# Patient Record
Sex: Female | Born: 1955
Health system: Southern US, Community
[De-identification: ages and names within clinical notes are randomized; demographics above are authoritative.]

## PROBLEM LIST (undated history)

## (undated) DIAGNOSIS — F339 Major depressive disorder, recurrent, unspecified: Secondary | ICD-10-CM

## (undated) DIAGNOSIS — I10 Essential (primary) hypertension: Secondary | ICD-10-CM

## (undated) DIAGNOSIS — G44229 Chronic tension-type headache, not intractable: Secondary | ICD-10-CM

## (undated) DIAGNOSIS — E538 Deficiency of other specified B group vitamins: Secondary | ICD-10-CM

## (undated) DIAGNOSIS — I7300 Raynaud's syndrome without gangrene: Secondary | ICD-10-CM

## (undated) DIAGNOSIS — E119 Type 2 diabetes mellitus without complications: Secondary | ICD-10-CM

## (undated) DIAGNOSIS — G43909 Migraine, unspecified, not intractable, without status migrainosus: Secondary | ICD-10-CM

## (undated) DIAGNOSIS — M797 Fibromyalgia: Secondary | ICD-10-CM

## (undated) DIAGNOSIS — J45909 Unspecified asthma, uncomplicated: Secondary | ICD-10-CM

## (undated) DIAGNOSIS — E079 Disorder of thyroid, unspecified: Secondary | ICD-10-CM

## (undated) DIAGNOSIS — E039 Hypothyroidism, unspecified: Secondary | ICD-10-CM

## (undated) DIAGNOSIS — E1142 Type 2 diabetes mellitus with diabetic polyneuropathy: Secondary | ICD-10-CM

## (undated) DIAGNOSIS — G629 Polyneuropathy, unspecified: Secondary | ICD-10-CM

## (undated) DIAGNOSIS — K219 Gastro-esophageal reflux disease without esophagitis: Secondary | ICD-10-CM

## (undated) DIAGNOSIS — G4733 Obstructive sleep apnea (adult) (pediatric): Secondary | ICD-10-CM

## (undated) DIAGNOSIS — M1990 Unspecified osteoarthritis, unspecified site: Secondary | ICD-10-CM

## (undated) DIAGNOSIS — F32A Depression, unspecified: Secondary | ICD-10-CM

## (undated) DIAGNOSIS — F411 Generalized anxiety disorder: Secondary | ICD-10-CM

## (undated) DIAGNOSIS — E559 Vitamin D deficiency, unspecified: Secondary | ICD-10-CM

## (undated) DIAGNOSIS — F329 Major depressive disorder, single episode, unspecified: Secondary | ICD-10-CM

## (undated) DIAGNOSIS — G4700 Insomnia, unspecified: Secondary | ICD-10-CM

## (undated) DIAGNOSIS — G4730 Sleep apnea, unspecified: Secondary | ICD-10-CM

## (undated) DIAGNOSIS — G5601 Carpal tunnel syndrome, right upper limb: Secondary | ICD-10-CM

## (undated) DIAGNOSIS — I2510 Atherosclerotic heart disease of native coronary artery without angina pectoris: Secondary | ICD-10-CM

## (undated) DIAGNOSIS — D869 Sarcoidosis, unspecified: Secondary | ICD-10-CM

## (undated) DIAGNOSIS — R404 Transient alteration of awareness: Secondary | ICD-10-CM

## (undated) DIAGNOSIS — M8580 Other specified disorders of bone density and structure, unspecified site: Secondary | ICD-10-CM

## (undated) DIAGNOSIS — E785 Hyperlipidemia, unspecified: Secondary | ICD-10-CM

## (undated) DIAGNOSIS — M549 Dorsalgia, unspecified: Secondary | ICD-10-CM

## (undated) DIAGNOSIS — E7800 Pure hypercholesterolemia, unspecified: Secondary | ICD-10-CM

## (undated) DIAGNOSIS — Z9989 Dependence on other enabling machines and devices: Secondary | ICD-10-CM

## (undated) HISTORY — DX: Hypothyroidism, unspecified: E03.9

## (undated) HISTORY — DX: Insomnia, unspecified: G47.00

## (undated) HISTORY — DX: Unspecified osteoarthritis, unspecified site: M19.90

## (undated) HISTORY — DX: Migraine, unspecified, not intractable, without status migrainosus: G43.909

## (undated) HISTORY — DX: Raynaud's syndrome without gangrene: I73.00

## (undated) HISTORY — DX: Vitamin D deficiency, unspecified: E55.9

## (undated) HISTORY — DX: Fibromyalgia: M79.7

## (undated) HISTORY — DX: Deficiency of other specified B group vitamins: E53.8

## (undated) HISTORY — DX: Dorsalgia, unspecified: M54.9

## (undated) HISTORY — DX: Major depressive disorder, recurrent, unspecified: F33.9

## (undated) HISTORY — DX: Depression, unspecified: F32.A

## (undated) HISTORY — DX: Obstructive sleep apnea (adult) (pediatric): G47.33

## (undated) HISTORY — DX: Carpal tunnel syndrome, right upper limb: G56.01

## (undated) HISTORY — DX: Unspecified asthma, uncomplicated: J45.909

## (undated) HISTORY — DX: Dependence on other enabling machines and devices: Z99.89

## (undated) HISTORY — PX: COLONOSCOPY: SHX174

## (undated) HISTORY — DX: Sleep apnea, unspecified: G47.30

## (undated) HISTORY — DX: Generalized anxiety disorder: F41.1

## (undated) HISTORY — DX: Major depressive disorder, single episode, unspecified: F32.9

## (undated) HISTORY — PX: TUBAL LIGATION: SHX77

## (undated) HISTORY — DX: Polyneuropathy, unspecified: G62.9

## (undated) HISTORY — DX: Other specified disorders of bone density and structure, unspecified site: M85.80

---
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
## 1976-01-06 HISTORY — PX: TONSILLECTOMY: SUR1361

## 1981-01-05 HISTORY — PX: NASAL POLYP SURGERY: SHX186

## 1998-08-12 ENCOUNTER — Other Ambulatory Visit: Admission: RE | Admit: 1998-08-12 | Discharge: 1998-08-12 | Payer: Self-pay | Admitting: Obstetrics & Gynecology

## 1999-10-23 ENCOUNTER — Other Ambulatory Visit: Admission: RE | Admit: 1999-10-23 | Discharge: 1999-10-23 | Payer: Self-pay | Admitting: Obstetrics & Gynecology

## 2001-07-05 ENCOUNTER — Other Ambulatory Visit: Admission: RE | Admit: 2001-07-05 | Discharge: 2001-07-05 | Payer: Self-pay | Admitting: Obstetrics & Gynecology

## 2003-07-05 ENCOUNTER — Other Ambulatory Visit: Admission: RE | Admit: 2003-07-05 | Discharge: 2003-07-05 | Payer: Self-pay | Admitting: Obstetrics & Gynecology

## 2004-10-16 ENCOUNTER — Ambulatory Visit: Payer: Self-pay | Admitting: Internal Medicine

## 2004-12-03 ENCOUNTER — Ambulatory Visit: Payer: Self-pay | Admitting: Internal Medicine

## 2008-01-20 DIAGNOSIS — J45909 Unspecified asthma, uncomplicated: Secondary | ICD-10-CM | POA: Insufficient documentation

## 2012-01-06 HISTORY — PX: CARPAL TUNNEL RELEASE: SHX101

## 2012-01-15 DIAGNOSIS — G5601 Carpal tunnel syndrome, right upper limb: Secondary | ICD-10-CM

## 2012-01-15 DIAGNOSIS — G5600 Carpal tunnel syndrome, unspecified upper limb: Secondary | ICD-10-CM | POA: Insufficient documentation

## 2012-01-15 HISTORY — DX: Carpal tunnel syndrome, right upper limb: G56.01

## 2012-02-08 ENCOUNTER — Other Ambulatory Visit: Payer: Self-pay | Admitting: Family Medicine

## 2012-03-15 ENCOUNTER — Other Ambulatory Visit: Payer: Self-pay | Admitting: Family Medicine

## 2012-03-28 ENCOUNTER — Other Ambulatory Visit: Payer: Self-pay | Admitting: Family Medicine

## 2012-11-24 DIAGNOSIS — M542 Cervicalgia: Secondary | ICD-10-CM | POA: Insufficient documentation

## 2012-11-24 DIAGNOSIS — M47812 Spondylosis without myelopathy or radiculopathy, cervical region: Secondary | ICD-10-CM | POA: Insufficient documentation

## 2013-01-27 DIAGNOSIS — M5410 Radiculopathy, site unspecified: Secondary | ICD-10-CM | POA: Insufficient documentation

## 2013-01-27 DIAGNOSIS — M9979 Connective tissue and disc stenosis of intervertebral foramina of abdomen and other regions: Secondary | ICD-10-CM | POA: Insufficient documentation

## 2013-01-27 DIAGNOSIS — M7918 Myalgia, other site: Secondary | ICD-10-CM | POA: Insufficient documentation

## 2013-02-10 DIAGNOSIS — D862 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: Secondary | ICD-10-CM | POA: Insufficient documentation

## 2013-02-10 HISTORY — DX: 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: D86.2

## 2013-06-02 DIAGNOSIS — E119 Type 2 diabetes mellitus without complications: Secondary | ICD-10-CM | POA: Insufficient documentation

## 2013-06-02 DIAGNOSIS — E039 Hypothyroidism, unspecified: Secondary | ICD-10-CM | POA: Insufficient documentation

## 2013-06-02 HISTORY — DX: Hypothyroidism, unspecified: E03.9

## 2013-06-27 DIAGNOSIS — R202 Paresthesia of skin: Secondary | ICD-10-CM | POA: Insufficient documentation

## 2014-01-09 DIAGNOSIS — J309 Allergic rhinitis, unspecified: Secondary | ICD-10-CM | POA: Diagnosis not present

## 2014-01-16 DIAGNOSIS — M6281 Muscle weakness (generalized): Secondary | ICD-10-CM | POA: Diagnosis not present

## 2014-01-16 DIAGNOSIS — Z9181 History of falling: Secondary | ICD-10-CM | POA: Diagnosis not present

## 2014-01-16 DIAGNOSIS — R2681 Unsteadiness on feet: Secondary | ICD-10-CM | POA: Diagnosis not present

## 2014-01-16 DIAGNOSIS — R262 Difficulty in walking, not elsewhere classified: Secondary | ICD-10-CM | POA: Diagnosis not present

## 2014-01-16 DIAGNOSIS — J309 Allergic rhinitis, unspecified: Secondary | ICD-10-CM | POA: Diagnosis not present

## 2014-01-23 DIAGNOSIS — Z8249 Family history of ischemic heart disease and other diseases of the circulatory system: Secondary | ICD-10-CM | POA: Diagnosis not present

## 2014-01-23 DIAGNOSIS — J309 Allergic rhinitis, unspecified: Secondary | ICD-10-CM | POA: Diagnosis not present

## 2014-01-23 DIAGNOSIS — E119 Type 2 diabetes mellitus without complications: Secondary | ICD-10-CM | POA: Diagnosis not present

## 2014-01-24 DIAGNOSIS — M6281 Muscle weakness (generalized): Secondary | ICD-10-CM | POA: Diagnosis not present

## 2014-01-24 DIAGNOSIS — R0683 Snoring: Secondary | ICD-10-CM | POA: Diagnosis not present

## 2014-01-24 DIAGNOSIS — R262 Difficulty in walking, not elsewhere classified: Secondary | ICD-10-CM | POA: Diagnosis not present

## 2014-01-24 DIAGNOSIS — R2681 Unsteadiness on feet: Secondary | ICD-10-CM | POA: Diagnosis not present

## 2014-01-24 DIAGNOSIS — G4733 Obstructive sleep apnea (adult) (pediatric): Secondary | ICD-10-CM | POA: Diagnosis not present

## 2014-01-24 DIAGNOSIS — Z9181 History of falling: Secondary | ICD-10-CM | POA: Diagnosis not present

## 2014-01-25 DIAGNOSIS — R262 Difficulty in walking, not elsewhere classified: Secondary | ICD-10-CM | POA: Diagnosis not present

## 2014-01-25 DIAGNOSIS — M6281 Muscle weakness (generalized): Secondary | ICD-10-CM | POA: Diagnosis not present

## 2014-01-25 DIAGNOSIS — R2681 Unsteadiness on feet: Secondary | ICD-10-CM | POA: Diagnosis not present

## 2014-01-25 DIAGNOSIS — Z9181 History of falling: Secondary | ICD-10-CM | POA: Diagnosis not present

## 2014-01-31 DIAGNOSIS — M6281 Muscle weakness (generalized): Secondary | ICD-10-CM | POA: Diagnosis not present

## 2014-01-31 DIAGNOSIS — R262 Difficulty in walking, not elsewhere classified: Secondary | ICD-10-CM | POA: Diagnosis not present

## 2014-01-31 DIAGNOSIS — Z9181 History of falling: Secondary | ICD-10-CM | POA: Diagnosis not present

## 2014-01-31 DIAGNOSIS — R2681 Unsteadiness on feet: Secondary | ICD-10-CM | POA: Diagnosis not present

## 2014-01-31 DIAGNOSIS — J309 Allergic rhinitis, unspecified: Secondary | ICD-10-CM | POA: Diagnosis not present

## 2014-02-01 DIAGNOSIS — M6281 Muscle weakness (generalized): Secondary | ICD-10-CM | POA: Diagnosis not present

## 2014-02-01 DIAGNOSIS — R2681 Unsteadiness on feet: Secondary | ICD-10-CM | POA: Diagnosis not present

## 2014-02-01 DIAGNOSIS — R262 Difficulty in walking, not elsewhere classified: Secondary | ICD-10-CM | POA: Diagnosis not present

## 2014-02-01 DIAGNOSIS — Z9181 History of falling: Secondary | ICD-10-CM | POA: Diagnosis not present

## 2014-02-02 DIAGNOSIS — Z9181 History of falling: Secondary | ICD-10-CM | POA: Diagnosis not present

## 2014-02-02 DIAGNOSIS — R262 Difficulty in walking, not elsewhere classified: Secondary | ICD-10-CM | POA: Diagnosis not present

## 2014-02-02 DIAGNOSIS — R2681 Unsteadiness on feet: Secondary | ICD-10-CM | POA: Diagnosis not present

## 2014-02-02 DIAGNOSIS — M6281 Muscle weakness (generalized): Secondary | ICD-10-CM | POA: Diagnosis not present

## 2014-02-05 DIAGNOSIS — R262 Difficulty in walking, not elsewhere classified: Secondary | ICD-10-CM | POA: Diagnosis not present

## 2014-02-05 DIAGNOSIS — Z9181 History of falling: Secondary | ICD-10-CM | POA: Diagnosis not present

## 2014-02-05 DIAGNOSIS — R2681 Unsteadiness on feet: Secondary | ICD-10-CM | POA: Diagnosis not present

## 2014-02-05 DIAGNOSIS — M6281 Muscle weakness (generalized): Secondary | ICD-10-CM | POA: Diagnosis not present

## 2014-02-06 DIAGNOSIS — J309 Allergic rhinitis, unspecified: Secondary | ICD-10-CM | POA: Diagnosis not present

## 2014-02-06 DIAGNOSIS — R2681 Unsteadiness on feet: Secondary | ICD-10-CM | POA: Diagnosis not present

## 2014-02-06 DIAGNOSIS — R262 Difficulty in walking, not elsewhere classified: Secondary | ICD-10-CM | POA: Diagnosis not present

## 2014-02-06 DIAGNOSIS — Z9181 History of falling: Secondary | ICD-10-CM | POA: Diagnosis not present

## 2014-02-06 DIAGNOSIS — M6281 Muscle weakness (generalized): Secondary | ICD-10-CM | POA: Diagnosis not present

## 2014-02-08 DIAGNOSIS — M6281 Muscle weakness (generalized): Secondary | ICD-10-CM | POA: Diagnosis not present

## 2014-02-08 DIAGNOSIS — R2681 Unsteadiness on feet: Secondary | ICD-10-CM | POA: Diagnosis not present

## 2014-02-08 DIAGNOSIS — R262 Difficulty in walking, not elsewhere classified: Secondary | ICD-10-CM | POA: Diagnosis not present

## 2014-02-08 DIAGNOSIS — Z9181 History of falling: Secondary | ICD-10-CM | POA: Diagnosis not present

## 2014-02-09 DIAGNOSIS — G629 Polyneuropathy, unspecified: Secondary | ICD-10-CM | POA: Diagnosis not present

## 2014-02-12 DIAGNOSIS — Z9181 History of falling: Secondary | ICD-10-CM | POA: Diagnosis not present

## 2014-02-12 DIAGNOSIS — M6281 Muscle weakness (generalized): Secondary | ICD-10-CM | POA: Diagnosis not present

## 2014-02-12 DIAGNOSIS — R2681 Unsteadiness on feet: Secondary | ICD-10-CM | POA: Diagnosis not present

## 2014-02-12 DIAGNOSIS — R262 Difficulty in walking, not elsewhere classified: Secondary | ICD-10-CM | POA: Diagnosis not present

## 2014-02-13 DIAGNOSIS — J309 Allergic rhinitis, unspecified: Secondary | ICD-10-CM | POA: Diagnosis not present

## 2014-02-13 DIAGNOSIS — E1140 Type 2 diabetes mellitus with diabetic neuropathy, unspecified: Secondary | ICD-10-CM | POA: Diagnosis not present

## 2014-02-13 DIAGNOSIS — E1069 Type 1 diabetes mellitus with other specified complication: Secondary | ICD-10-CM | POA: Diagnosis not present

## 2014-02-13 DIAGNOSIS — G4730 Sleep apnea, unspecified: Secondary | ICD-10-CM | POA: Diagnosis not present

## 2014-02-13 DIAGNOSIS — G43009 Migraine without aura, not intractable, without status migrainosus: Secondary | ICD-10-CM | POA: Diagnosis not present

## 2014-02-14 DIAGNOSIS — H9313 Tinnitus, bilateral: Secondary | ICD-10-CM | POA: Diagnosis not present

## 2014-02-14 DIAGNOSIS — Z9181 History of falling: Secondary | ICD-10-CM | POA: Diagnosis not present

## 2014-02-14 DIAGNOSIS — H903 Sensorineural hearing loss, bilateral: Secondary | ICD-10-CM | POA: Diagnosis not present

## 2014-02-14 DIAGNOSIS — R262 Difficulty in walking, not elsewhere classified: Secondary | ICD-10-CM | POA: Diagnosis not present

## 2014-02-14 DIAGNOSIS — M6281 Muscle weakness (generalized): Secondary | ICD-10-CM | POA: Diagnosis not present

## 2014-02-14 DIAGNOSIS — R2681 Unsteadiness on feet: Secondary | ICD-10-CM | POA: Diagnosis not present

## 2014-02-20 DIAGNOSIS — R262 Difficulty in walking, not elsewhere classified: Secondary | ICD-10-CM | POA: Diagnosis not present

## 2014-02-20 DIAGNOSIS — R2681 Unsteadiness on feet: Secondary | ICD-10-CM | POA: Diagnosis not present

## 2014-02-20 DIAGNOSIS — Z9181 History of falling: Secondary | ICD-10-CM | POA: Diagnosis not present

## 2014-02-20 DIAGNOSIS — M6281 Muscle weakness (generalized): Secondary | ICD-10-CM | POA: Diagnosis not present

## 2014-02-20 DIAGNOSIS — J309 Allergic rhinitis, unspecified: Secondary | ICD-10-CM | POA: Diagnosis not present

## 2014-02-22 DIAGNOSIS — M6281 Muscle weakness (generalized): Secondary | ICD-10-CM | POA: Diagnosis not present

## 2014-02-22 DIAGNOSIS — R262 Difficulty in walking, not elsewhere classified: Secondary | ICD-10-CM | POA: Diagnosis not present

## 2014-02-22 DIAGNOSIS — R2681 Unsteadiness on feet: Secondary | ICD-10-CM | POA: Diagnosis not present

## 2014-02-22 DIAGNOSIS — Z9181 History of falling: Secondary | ICD-10-CM | POA: Diagnosis not present

## 2014-02-24 DIAGNOSIS — G4733 Obstructive sleep apnea (adult) (pediatric): Secondary | ICD-10-CM | POA: Diagnosis not present

## 2014-02-27 DIAGNOSIS — R262 Difficulty in walking, not elsewhere classified: Secondary | ICD-10-CM | POA: Diagnosis not present

## 2014-02-27 DIAGNOSIS — M6281 Muscle weakness (generalized): Secondary | ICD-10-CM | POA: Diagnosis not present

## 2014-02-27 DIAGNOSIS — Z9181 History of falling: Secondary | ICD-10-CM | POA: Diagnosis not present

## 2014-02-27 DIAGNOSIS — J309 Allergic rhinitis, unspecified: Secondary | ICD-10-CM | POA: Diagnosis not present

## 2014-02-27 DIAGNOSIS — R2681 Unsteadiness on feet: Secondary | ICD-10-CM | POA: Diagnosis not present

## 2014-02-28 DIAGNOSIS — D509 Iron deficiency anemia, unspecified: Secondary | ICD-10-CM | POA: Diagnosis not present

## 2014-02-28 DIAGNOSIS — I129 Hypertensive chronic kidney disease with stage 1 through stage 4 chronic kidney disease, or unspecified chronic kidney disease: Secondary | ICD-10-CM | POA: Diagnosis not present

## 2014-02-28 DIAGNOSIS — N182 Chronic kidney disease, stage 2 (mild): Secondary | ICD-10-CM | POA: Diagnosis not present

## 2014-02-28 DIAGNOSIS — E1140 Type 2 diabetes mellitus with diabetic neuropathy, unspecified: Secondary | ICD-10-CM | POA: Diagnosis not present

## 2014-02-28 DIAGNOSIS — E1322 Other specified diabetes mellitus with diabetic chronic kidney disease: Secondary | ICD-10-CM | POA: Diagnosis not present

## 2014-03-01 DIAGNOSIS — R262 Difficulty in walking, not elsewhere classified: Secondary | ICD-10-CM | POA: Diagnosis not present

## 2014-03-01 DIAGNOSIS — M6281 Muscle weakness (generalized): Secondary | ICD-10-CM | POA: Diagnosis not present

## 2014-03-01 DIAGNOSIS — R2681 Unsteadiness on feet: Secondary | ICD-10-CM | POA: Diagnosis not present

## 2014-03-01 DIAGNOSIS — Z9181 History of falling: Secondary | ICD-10-CM | POA: Diagnosis not present

## 2014-03-06 DIAGNOSIS — Z9181 History of falling: Secondary | ICD-10-CM | POA: Diagnosis not present

## 2014-03-06 DIAGNOSIS — M6281 Muscle weakness (generalized): Secondary | ICD-10-CM | POA: Diagnosis not present

## 2014-03-06 DIAGNOSIS — R2681 Unsteadiness on feet: Secondary | ICD-10-CM | POA: Diagnosis not present

## 2014-03-06 DIAGNOSIS — J309 Allergic rhinitis, unspecified: Secondary | ICD-10-CM | POA: Diagnosis not present

## 2014-03-06 DIAGNOSIS — R262 Difficulty in walking, not elsewhere classified: Secondary | ICD-10-CM | POA: Diagnosis not present

## 2014-03-08 DIAGNOSIS — M6281 Muscle weakness (generalized): Secondary | ICD-10-CM | POA: Diagnosis not present

## 2014-03-08 DIAGNOSIS — R2681 Unsteadiness on feet: Secondary | ICD-10-CM | POA: Diagnosis not present

## 2014-03-08 DIAGNOSIS — R262 Difficulty in walking, not elsewhere classified: Secondary | ICD-10-CM | POA: Diagnosis not present

## 2014-03-08 DIAGNOSIS — Z9181 History of falling: Secondary | ICD-10-CM | POA: Diagnosis not present

## 2014-03-13 DIAGNOSIS — J309 Allergic rhinitis, unspecified: Secondary | ICD-10-CM | POA: Diagnosis not present

## 2014-03-20 DIAGNOSIS — J309 Allergic rhinitis, unspecified: Secondary | ICD-10-CM | POA: Diagnosis not present

## 2014-03-25 DIAGNOSIS — G4733 Obstructive sleep apnea (adult) (pediatric): Secondary | ICD-10-CM | POA: Diagnosis not present

## 2014-03-26 DIAGNOSIS — D860 Sarcoidosis of lung: Secondary | ICD-10-CM | POA: Diagnosis not present

## 2014-03-27 DIAGNOSIS — J309 Allergic rhinitis, unspecified: Secondary | ICD-10-CM | POA: Diagnosis not present

## 2014-03-29 DIAGNOSIS — R109 Unspecified abdominal pain: Secondary | ICD-10-CM | POA: Diagnosis not present

## 2014-03-29 DIAGNOSIS — D509 Iron deficiency anemia, unspecified: Secondary | ICD-10-CM | POA: Diagnosis not present

## 2014-03-29 DIAGNOSIS — K5900 Constipation, unspecified: Secondary | ICD-10-CM | POA: Diagnosis not present

## 2014-03-29 DIAGNOSIS — Z6841 Body Mass Index (BMI) 40.0 and over, adult: Secondary | ICD-10-CM | POA: Diagnosis not present

## 2014-04-03 DIAGNOSIS — R1011 Right upper quadrant pain: Secondary | ICD-10-CM | POA: Diagnosis not present

## 2014-04-03 DIAGNOSIS — J309 Allergic rhinitis, unspecified: Secondary | ICD-10-CM | POA: Diagnosis not present

## 2014-04-05 DIAGNOSIS — R109 Unspecified abdominal pain: Secondary | ICD-10-CM | POA: Diagnosis not present

## 2014-04-05 DIAGNOSIS — K6389 Other specified diseases of intestine: Secondary | ICD-10-CM | POA: Diagnosis not present

## 2014-04-05 DIAGNOSIS — K219 Gastro-esophageal reflux disease without esophagitis: Secondary | ICD-10-CM | POA: Diagnosis not present

## 2014-04-05 DIAGNOSIS — R1013 Epigastric pain: Secondary | ICD-10-CM | POA: Diagnosis not present

## 2014-04-05 DIAGNOSIS — E780 Pure hypercholesterolemia: Secondary | ICD-10-CM | POA: Diagnosis not present

## 2014-04-05 DIAGNOSIS — R918 Other nonspecific abnormal finding of lung field: Secondary | ICD-10-CM | POA: Diagnosis not present

## 2014-04-05 DIAGNOSIS — I10 Essential (primary) hypertension: Secondary | ICD-10-CM | POA: Diagnosis not present

## 2014-04-05 DIAGNOSIS — I700 Atherosclerosis of aorta: Secondary | ICD-10-CM | POA: Diagnosis not present

## 2014-04-05 DIAGNOSIS — K8050 Calculus of bile duct without cholangitis or cholecystitis without obstruction: Secondary | ICD-10-CM | POA: Diagnosis not present

## 2014-04-05 DIAGNOSIS — K567 Ileus, unspecified: Secondary | ICD-10-CM | POA: Diagnosis not present

## 2014-04-05 DIAGNOSIS — E119 Type 2 diabetes mellitus without complications: Secondary | ICD-10-CM | POA: Diagnosis not present

## 2014-04-05 DIAGNOSIS — J45909 Unspecified asthma, uncomplicated: Secondary | ICD-10-CM | POA: Diagnosis not present

## 2014-04-05 DIAGNOSIS — E039 Hypothyroidism, unspecified: Secondary | ICD-10-CM | POA: Diagnosis not present

## 2014-04-05 DIAGNOSIS — R1011 Right upper quadrant pain: Secondary | ICD-10-CM | POA: Diagnosis not present

## 2014-04-10 DIAGNOSIS — J309 Allergic rhinitis, unspecified: Secondary | ICD-10-CM | POA: Diagnosis not present

## 2014-04-10 DIAGNOSIS — R109 Unspecified abdominal pain: Secondary | ICD-10-CM | POA: Diagnosis not present

## 2014-04-17 DIAGNOSIS — J309 Allergic rhinitis, unspecified: Secondary | ICD-10-CM | POA: Diagnosis not present

## 2014-04-24 DIAGNOSIS — J309 Allergic rhinitis, unspecified: Secondary | ICD-10-CM | POA: Diagnosis not present

## 2014-04-25 DIAGNOSIS — G4733 Obstructive sleep apnea (adult) (pediatric): Secondary | ICD-10-CM | POA: Diagnosis not present

## 2014-04-26 DIAGNOSIS — M19049 Primary osteoarthritis, unspecified hand: Secondary | ICD-10-CM | POA: Diagnosis not present

## 2014-04-26 DIAGNOSIS — E538 Deficiency of other specified B group vitamins: Secondary | ICD-10-CM | POA: Diagnosis not present

## 2014-04-26 DIAGNOSIS — M7550 Bursitis of unspecified shoulder: Secondary | ICD-10-CM | POA: Diagnosis not present

## 2014-04-26 DIAGNOSIS — D860 Sarcoidosis of lung: Secondary | ICD-10-CM | POA: Diagnosis not present

## 2014-04-26 DIAGNOSIS — M47817 Spondylosis without myelopathy or radiculopathy, lumbosacral region: Secondary | ICD-10-CM | POA: Diagnosis not present

## 2014-04-26 DIAGNOSIS — R200 Anesthesia of skin: Secondary | ICD-10-CM | POA: Diagnosis not present

## 2014-04-26 DIAGNOSIS — M151 Heberden's nodes (with arthropathy): Secondary | ICD-10-CM | POA: Diagnosis not present

## 2014-04-26 DIAGNOSIS — M795 Residual foreign body in soft tissue: Secondary | ICD-10-CM | POA: Diagnosis not present

## 2014-04-26 DIAGNOSIS — M1812 Unilateral primary osteoarthritis of first carpometacarpal joint, left hand: Secondary | ICD-10-CM | POA: Diagnosis not present

## 2014-04-26 DIAGNOSIS — E039 Hypothyroidism, unspecified: Secondary | ICD-10-CM | POA: Diagnosis not present

## 2014-04-26 DIAGNOSIS — D869 Sarcoidosis, unspecified: Secondary | ICD-10-CM | POA: Diagnosis not present

## 2014-04-26 DIAGNOSIS — M7062 Trochanteric bursitis, left hip: Secondary | ICD-10-CM | POA: Diagnosis not present

## 2014-04-26 DIAGNOSIS — M5136 Other intervertebral disc degeneration, lumbar region: Secondary | ICD-10-CM | POA: Diagnosis not present

## 2014-04-26 DIAGNOSIS — M4316 Spondylolisthesis, lumbar region: Secondary | ICD-10-CM | POA: Diagnosis not present

## 2014-04-26 DIAGNOSIS — E1140 Type 2 diabetes mellitus with diabetic neuropathy, unspecified: Secondary | ICD-10-CM | POA: Diagnosis not present

## 2014-04-26 DIAGNOSIS — M1811 Unilateral primary osteoarthritis of first carpometacarpal joint, right hand: Secondary | ICD-10-CM | POA: Diagnosis not present

## 2014-04-26 DIAGNOSIS — M5032 Other cervical disc degeneration, mid-cervical region: Secondary | ICD-10-CM | POA: Diagnosis not present

## 2014-04-26 DIAGNOSIS — M47813 Spondylosis without myelopathy or radiculopathy, cervicothoracic region: Secondary | ICD-10-CM | POA: Diagnosis not present

## 2014-05-01 DIAGNOSIS — J309 Allergic rhinitis, unspecified: Secondary | ICD-10-CM | POA: Diagnosis not present

## 2014-05-08 DIAGNOSIS — K589 Irritable bowel syndrome without diarrhea: Secondary | ICD-10-CM | POA: Diagnosis not present

## 2014-05-08 DIAGNOSIS — K210 Gastro-esophageal reflux disease with esophagitis: Secondary | ICD-10-CM | POA: Diagnosis not present

## 2014-05-08 DIAGNOSIS — R1011 Right upper quadrant pain: Secondary | ICD-10-CM | POA: Diagnosis not present

## 2014-05-08 DIAGNOSIS — J309 Allergic rhinitis, unspecified: Secondary | ICD-10-CM | POA: Diagnosis not present

## 2014-05-08 DIAGNOSIS — K760 Fatty (change of) liver, not elsewhere classified: Secondary | ICD-10-CM | POA: Diagnosis not present

## 2014-05-09 DIAGNOSIS — J209 Acute bronchitis, unspecified: Secondary | ICD-10-CM | POA: Diagnosis not present

## 2014-05-09 DIAGNOSIS — J0100 Acute maxillary sinusitis, unspecified: Secondary | ICD-10-CM | POA: Diagnosis not present

## 2014-05-15 DIAGNOSIS — J309 Allergic rhinitis, unspecified: Secondary | ICD-10-CM | POA: Diagnosis not present

## 2014-05-16 DIAGNOSIS — D869 Sarcoidosis, unspecified: Secondary | ICD-10-CM | POA: Diagnosis not present

## 2014-05-16 DIAGNOSIS — H1013 Acute atopic conjunctivitis, bilateral: Secondary | ICD-10-CM | POA: Diagnosis not present

## 2014-05-16 DIAGNOSIS — E119 Type 2 diabetes mellitus without complications: Secondary | ICD-10-CM | POA: Diagnosis not present

## 2014-05-16 DIAGNOSIS — Z0389 Encounter for observation for other suspected diseases and conditions ruled out: Secondary | ICD-10-CM | POA: Diagnosis not present

## 2014-05-18 DIAGNOSIS — K219 Gastro-esophageal reflux disease without esophagitis: Secondary | ICD-10-CM | POA: Diagnosis not present

## 2014-05-18 DIAGNOSIS — K2950 Unspecified chronic gastritis without bleeding: Secondary | ICD-10-CM | POA: Diagnosis not present

## 2014-05-18 DIAGNOSIS — K2900 Acute gastritis without bleeding: Secondary | ICD-10-CM | POA: Diagnosis not present

## 2014-05-18 DIAGNOSIS — K210 Gastro-esophageal reflux disease with esophagitis: Secondary | ICD-10-CM | POA: Diagnosis not present

## 2014-05-18 DIAGNOSIS — K2960 Other gastritis without bleeding: Secondary | ICD-10-CM | POA: Diagnosis not present

## 2014-05-18 DIAGNOSIS — K2970 Gastritis, unspecified, without bleeding: Secondary | ICD-10-CM | POA: Diagnosis not present

## 2014-05-18 DIAGNOSIS — R1013 Epigastric pain: Secondary | ICD-10-CM | POA: Diagnosis not present

## 2014-05-22 DIAGNOSIS — Z1211 Encounter for screening for malignant neoplasm of colon: Secondary | ICD-10-CM | POA: Diagnosis not present

## 2014-05-22 DIAGNOSIS — Z1239 Encounter for other screening for malignant neoplasm of breast: Secondary | ICD-10-CM | POA: Diagnosis not present

## 2014-05-22 DIAGNOSIS — Z0000 Encounter for general adult medical examination without abnormal findings: Secondary | ICD-10-CM | POA: Diagnosis not present

## 2014-05-22 DIAGNOSIS — J309 Allergic rhinitis, unspecified: Secondary | ICD-10-CM | POA: Diagnosis not present

## 2014-05-22 DIAGNOSIS — Z124 Encounter for screening for malignant neoplasm of cervix: Secondary | ICD-10-CM | POA: Diagnosis not present

## 2014-05-22 DIAGNOSIS — Z01419 Encounter for gynecological examination (general) (routine) without abnormal findings: Secondary | ICD-10-CM | POA: Diagnosis not present

## 2014-05-24 DIAGNOSIS — G8929 Other chronic pain: Secondary | ICD-10-CM | POA: Diagnosis not present

## 2014-05-24 DIAGNOSIS — M542 Cervicalgia: Secondary | ICD-10-CM | POA: Diagnosis not present

## 2014-05-24 DIAGNOSIS — Z6841 Body Mass Index (BMI) 40.0 and over, adult: Secondary | ICD-10-CM | POA: Diagnosis not present

## 2014-05-25 DIAGNOSIS — G4733 Obstructive sleep apnea (adult) (pediatric): Secondary | ICD-10-CM | POA: Diagnosis not present

## 2014-05-30 ENCOUNTER — Encounter: Payer: Self-pay | Admitting: Podiatry

## 2014-05-30 ENCOUNTER — Ambulatory Visit (INDEPENDENT_AMBULATORY_CARE_PROVIDER_SITE_OTHER): Payer: Commercial Managed Care - HMO | Admitting: Podiatry

## 2014-05-30 VITALS — BP 150/74 | HR 74 | Ht 60.0 in | Wt 230.0 lb

## 2014-05-30 DIAGNOSIS — E349 Endocrine disorder, unspecified: Secondary | ICD-10-CM | POA: Diagnosis not present

## 2014-05-30 DIAGNOSIS — M21962 Unspecified acquired deformity of left lower leg: Secondary | ICD-10-CM

## 2014-05-30 DIAGNOSIS — M21969 Unspecified acquired deformity of unspecified lower leg: Secondary | ICD-10-CM | POA: Diagnosis not present

## 2014-05-30 DIAGNOSIS — M7740 Metatarsalgia, unspecified foot: Secondary | ICD-10-CM

## 2014-05-30 DIAGNOSIS — G629 Polyneuropathy, unspecified: Secondary | ICD-10-CM

## 2014-05-30 DIAGNOSIS — M21961 Unspecified acquired deformity of right lower leg: Secondary | ICD-10-CM

## 2014-05-30 DIAGNOSIS — M7741 Metatarsalgia, right foot: Secondary | ICD-10-CM

## 2014-05-30 DIAGNOSIS — G63 Polyneuropathy in diseases classified elsewhere: Secondary | ICD-10-CM

## 2014-05-30 NOTE — Patient Instructions (Signed)
Seen for pain on both feet. Noted of abnormal metatarsal bone with excess motion. May benefit from metatarsal binder. Need Diabetic shoes.

## 2014-05-30 NOTE — Progress Notes (Signed)
Subjective: 59 year old female presents accompanied by her daughter with complaining of painful feet.  Stated that she has insert since 1999. Has neuropathy, feet are numb and painful under balls of both feet. Does have on leg shorter than the other. Blood sugar 118 this morning. A1C is 6.2. Does stay on feet a lot.  Review of Systems - General ROS: negative for - chills, fatigue, fever, hot flashes or malaise Ophthalmic ROS: negative ENT ROS: negative Allergy and Immunology ROS: Takes allergy shots. Breast ROS: negative for breast lumps Respiratory ROS: no cough, shortness of breath, or wheezing Cardiovascular ROS: no chest pain or dyspnea on exertion Gastrointestinal ROS: IBS with random constipation, diarrhea, pain. Genito-Urinary ROS: no dysuria, trouble voiding, or hematuria Musculoskeletal ROS: negative Osteoarthritis. Neurological ROS: Frequent depression and anxiety. Dermatological ROS: negative.  Objective: Dermatologic: Normal skin without abnormal lesions. Neurologic: All epicritic and tactile sensations grossly intact. Normal response to Monofilament sensory testing bilateral. Positive of subjective numbness on both feet below ankle joint. Vascular: All pedal pulses are palpable. No edema or erythema noted. Orthopedic: Rectus foot. Hypermobile first bilateral. Elevated first ray with loading of forefoot.  Tight Achilles tendon right.  Radiographic examination reveal elevated first ray bilateral, dorsal spur at the first metatarsal head bilateral, plantar calcaneal spur bilateral, increased lateral deviation angle of Calcaneocuboid angle bilateral.   Assessment: Hypermobile first ray bilateral. Lesser metatarsalgia secondary to lateral weight shifting from hypermobile first ray bilateral. Ankle equinus right.   Plan: Reviewed clinical findings and available treatment options.  Metatarsal binder dispensed to add stability of mid foot, Metatarsocuneiform joint  bilateral. Will do diabetic shoes on her next visit.

## 2014-05-31 DIAGNOSIS — S40861A Insect bite (nonvenomous) of right upper arm, initial encounter: Secondary | ICD-10-CM | POA: Diagnosis not present

## 2014-06-04 DIAGNOSIS — Z1231 Encounter for screening mammogram for malignant neoplasm of breast: Secondary | ICD-10-CM | POA: Diagnosis not present

## 2014-06-06 DIAGNOSIS — M542 Cervicalgia: Secondary | ICD-10-CM | POA: Diagnosis not present

## 2014-06-11 DIAGNOSIS — M542 Cervicalgia: Secondary | ICD-10-CM | POA: Diagnosis not present

## 2014-06-13 DIAGNOSIS — J309 Allergic rhinitis, unspecified: Secondary | ICD-10-CM | POA: Diagnosis not present

## 2014-06-13 DIAGNOSIS — G4733 Obstructive sleep apnea (adult) (pediatric): Secondary | ICD-10-CM | POA: Diagnosis not present

## 2014-06-13 DIAGNOSIS — Z6841 Body Mass Index (BMI) 40.0 and over, adult: Secondary | ICD-10-CM | POA: Diagnosis not present

## 2014-06-13 DIAGNOSIS — J209 Acute bronchitis, unspecified: Secondary | ICD-10-CM | POA: Diagnosis not present

## 2014-06-13 DIAGNOSIS — J329 Chronic sinusitis, unspecified: Secondary | ICD-10-CM | POA: Diagnosis not present

## 2014-06-14 DIAGNOSIS — M542 Cervicalgia: Secondary | ICD-10-CM | POA: Diagnosis not present

## 2014-06-19 DIAGNOSIS — M542 Cervicalgia: Secondary | ICD-10-CM | POA: Diagnosis not present

## 2014-06-21 DIAGNOSIS — M7521 Bicipital tendinitis, right shoulder: Secondary | ICD-10-CM | POA: Diagnosis not present

## 2014-06-21 DIAGNOSIS — Z6841 Body Mass Index (BMI) 40.0 and over, adult: Secondary | ICD-10-CM | POA: Diagnosis not present

## 2014-06-22 DIAGNOSIS — M542 Cervicalgia: Secondary | ICD-10-CM | POA: Diagnosis not present

## 2014-06-25 DIAGNOSIS — G4733 Obstructive sleep apnea (adult) (pediatric): Secondary | ICD-10-CM | POA: Diagnosis not present

## 2014-06-28 DIAGNOSIS — E039 Hypothyroidism, unspecified: Secondary | ICD-10-CM | POA: Diagnosis not present

## 2014-06-28 DIAGNOSIS — E782 Mixed hyperlipidemia: Secondary | ICD-10-CM | POA: Diagnosis not present

## 2014-06-28 DIAGNOSIS — E1069 Type 1 diabetes mellitus with other specified complication: Secondary | ICD-10-CM | POA: Diagnosis not present

## 2014-06-28 DIAGNOSIS — M542 Cervicalgia: Secondary | ICD-10-CM | POA: Diagnosis not present

## 2014-06-28 DIAGNOSIS — E1322 Other specified diabetes mellitus with diabetic chronic kidney disease: Secondary | ICD-10-CM | POA: Diagnosis not present

## 2014-07-03 DIAGNOSIS — J309 Allergic rhinitis, unspecified: Secondary | ICD-10-CM | POA: Diagnosis not present

## 2014-07-04 ENCOUNTER — Ambulatory Visit (INDEPENDENT_AMBULATORY_CARE_PROVIDER_SITE_OTHER): Payer: Commercial Managed Care - HMO | Admitting: Podiatry

## 2014-07-04 ENCOUNTER — Encounter: Payer: Self-pay | Admitting: Podiatry

## 2014-07-04 DIAGNOSIS — M21969 Unspecified acquired deformity of unspecified lower leg: Secondary | ICD-10-CM

## 2014-07-04 DIAGNOSIS — E349 Endocrine disorder, unspecified: Secondary | ICD-10-CM | POA: Diagnosis not present

## 2014-07-04 DIAGNOSIS — G63 Polyneuropathy in diseases classified elsewhere: Secondary | ICD-10-CM

## 2014-07-04 DIAGNOSIS — E1142 Type 2 diabetes mellitus with diabetic polyneuropathy: Secondary | ICD-10-CM

## 2014-07-04 DIAGNOSIS — M7740 Metatarsalgia, unspecified foot: Secondary | ICD-10-CM | POA: Diagnosis not present

## 2014-07-04 NOTE — Progress Notes (Signed)
Subjective: 59 year old female presents a PCP certification and request for diabetic shoes.  No change since the last visit.  Objective: Dermatologic: Normal skin without abnormal lesions. Neurologic: All epicritic and tactile sensations grossly intact. Normal response to Monofilament sensory testing bilateral. Positive of subjective numbness on both feet below ankle joint. Vascular: All pedal pulses are palpable. No edema or erythema noted. Orthopedic: Rectus foot. Hypermobile first bilateral. Elevated first ray with loading of forefoot.  Tight Achilles tendon right.  Last X-ray report indicated elevated first ray bilateral, dorsal spur at the first metatarsal head bilateral, plantar calcaneal spur bilateral, increased lateral deviation angle of Calcaneocuboid angle bilateral.   Assessment: Hypermobile first ray bilateral. Lesser metatarsalgia secondary to lateral weight shifting from hypermobile first ray bilateral. Ankle equinus right.   Plan: Reviewed clinical findings and available treatment options.  Metatarsal binder dispensed to add stability of mid foot, Metatarsocuneiform joint bilateral

## 2014-07-04 NOTE — Patient Instructions (Signed)
Both feet measured for diabetic shoes. 

## 2014-07-15 DIAGNOSIS — J0100 Acute maxillary sinusitis, unspecified: Secondary | ICD-10-CM | POA: Diagnosis not present

## 2014-07-15 DIAGNOSIS — R300 Dysuria: Secondary | ICD-10-CM | POA: Diagnosis not present

## 2014-07-22 DIAGNOSIS — M542 Cervicalgia: Secondary | ICD-10-CM | POA: Diagnosis not present

## 2014-07-23 DIAGNOSIS — J309 Allergic rhinitis, unspecified: Secondary | ICD-10-CM | POA: Diagnosis not present

## 2014-07-23 DIAGNOSIS — I10 Essential (primary) hypertension: Secondary | ICD-10-CM | POA: Diagnosis not present

## 2014-07-23 DIAGNOSIS — D860 Sarcoidosis of lung: Secondary | ICD-10-CM | POA: Diagnosis not present

## 2014-07-23 DIAGNOSIS — E1140 Type 2 diabetes mellitus with diabetic neuropathy, unspecified: Secondary | ICD-10-CM | POA: Diagnosis not present

## 2014-07-23 DIAGNOSIS — G4733 Obstructive sleep apnea (adult) (pediatric): Secondary | ICD-10-CM | POA: Diagnosis not present

## 2014-07-23 DIAGNOSIS — M1990 Unspecified osteoarthritis, unspecified site: Secondary | ICD-10-CM | POA: Diagnosis not present

## 2014-07-23 DIAGNOSIS — E039 Hypothyroidism, unspecified: Secondary | ICD-10-CM | POA: Diagnosis not present

## 2014-07-23 DIAGNOSIS — E119 Type 2 diabetes mellitus without complications: Secondary | ICD-10-CM | POA: Diagnosis not present

## 2014-07-25 DIAGNOSIS — G4733 Obstructive sleep apnea (adult) (pediatric): Secondary | ICD-10-CM | POA: Diagnosis not present

## 2014-07-27 DIAGNOSIS — J309 Allergic rhinitis, unspecified: Secondary | ICD-10-CM | POA: Diagnosis not present

## 2014-08-06 DIAGNOSIS — E559 Vitamin D deficiency, unspecified: Secondary | ICD-10-CM | POA: Diagnosis not present

## 2014-08-06 DIAGNOSIS — Z6841 Body Mass Index (BMI) 40.0 and over, adult: Secondary | ICD-10-CM | POA: Diagnosis not present

## 2014-08-06 DIAGNOSIS — E1140 Type 2 diabetes mellitus with diabetic neuropathy, unspecified: Secondary | ICD-10-CM | POA: Diagnosis not present

## 2014-08-06 DIAGNOSIS — R5383 Other fatigue: Secondary | ICD-10-CM | POA: Diagnosis not present

## 2014-08-06 DIAGNOSIS — E538 Deficiency of other specified B group vitamins: Secondary | ICD-10-CM | POA: Diagnosis not present

## 2014-08-08 DIAGNOSIS — J309 Allergic rhinitis, unspecified: Secondary | ICD-10-CM | POA: Diagnosis not present

## 2014-08-08 DIAGNOSIS — D869 Sarcoidosis, unspecified: Secondary | ICD-10-CM | POA: Diagnosis not present

## 2014-08-08 DIAGNOSIS — G4733 Obstructive sleep apnea (adult) (pediatric): Secondary | ICD-10-CM | POA: Diagnosis not present

## 2014-08-08 DIAGNOSIS — R5383 Other fatigue: Secondary | ICD-10-CM | POA: Diagnosis not present

## 2014-08-10 DIAGNOSIS — G4733 Obstructive sleep apnea (adult) (pediatric): Secondary | ICD-10-CM | POA: Diagnosis not present

## 2014-08-16 DIAGNOSIS — J449 Chronic obstructive pulmonary disease, unspecified: Secondary | ICD-10-CM | POA: Diagnosis not present

## 2014-08-20 DIAGNOSIS — J329 Chronic sinusitis, unspecified: Secondary | ICD-10-CM | POA: Diagnosis not present

## 2014-08-20 DIAGNOSIS — Z6841 Body Mass Index (BMI) 40.0 and over, adult: Secondary | ICD-10-CM | POA: Diagnosis not present

## 2014-08-20 DIAGNOSIS — J309 Allergic rhinitis, unspecified: Secondary | ICD-10-CM | POA: Diagnosis not present

## 2014-08-22 DIAGNOSIS — M542 Cervicalgia: Secondary | ICD-10-CM | POA: Diagnosis not present

## 2014-08-23 DIAGNOSIS — D869 Sarcoidosis, unspecified: Secondary | ICD-10-CM | POA: Diagnosis not present

## 2014-08-23 DIAGNOSIS — R591 Generalized enlarged lymph nodes: Secondary | ICD-10-CM | POA: Diagnosis not present

## 2014-08-23 DIAGNOSIS — R918 Other nonspecific abnormal finding of lung field: Secondary | ICD-10-CM | POA: Diagnosis not present

## 2014-08-25 DIAGNOSIS — G4733 Obstructive sleep apnea (adult) (pediatric): Secondary | ICD-10-CM | POA: Diagnosis not present

## 2014-08-27 DIAGNOSIS — J329 Chronic sinusitis, unspecified: Secondary | ICD-10-CM | POA: Diagnosis not present

## 2014-08-27 DIAGNOSIS — R0600 Dyspnea, unspecified: Secondary | ICD-10-CM | POA: Diagnosis not present

## 2014-08-27 DIAGNOSIS — J309 Allergic rhinitis, unspecified: Secondary | ICD-10-CM | POA: Diagnosis not present

## 2014-08-27 DIAGNOSIS — J321 Chronic frontal sinusitis: Secondary | ICD-10-CM | POA: Diagnosis not present

## 2014-08-27 DIAGNOSIS — G4733 Obstructive sleep apnea (adult) (pediatric): Secondary | ICD-10-CM | POA: Diagnosis not present

## 2014-08-27 DIAGNOSIS — D869 Sarcoidosis, unspecified: Secondary | ICD-10-CM | POA: Diagnosis not present

## 2014-08-29 DIAGNOSIS — N62 Hypertrophy of breast: Secondary | ICD-10-CM | POA: Diagnosis not present

## 2014-09-07 DIAGNOSIS — E1140 Type 2 diabetes mellitus with diabetic neuropathy, unspecified: Secondary | ICD-10-CM | POA: Diagnosis not present

## 2014-09-07 DIAGNOSIS — J329 Chronic sinusitis, unspecified: Secondary | ICD-10-CM | POA: Diagnosis not present

## 2014-09-11 DIAGNOSIS — J309 Allergic rhinitis, unspecified: Secondary | ICD-10-CM | POA: Diagnosis not present

## 2014-09-13 DIAGNOSIS — R0981 Nasal congestion: Secondary | ICD-10-CM | POA: Diagnosis not present

## 2014-09-13 DIAGNOSIS — J329 Chronic sinusitis, unspecified: Secondary | ICD-10-CM | POA: Diagnosis not present

## 2014-09-13 DIAGNOSIS — J343 Hypertrophy of nasal turbinates: Secondary | ICD-10-CM | POA: Diagnosis not present

## 2014-09-13 DIAGNOSIS — J342 Deviated nasal septum: Secondary | ICD-10-CM | POA: Diagnosis not present

## 2014-09-14 DIAGNOSIS — E1140 Type 2 diabetes mellitus with diabetic neuropathy, unspecified: Secondary | ICD-10-CM | POA: Diagnosis not present

## 2014-09-20 DIAGNOSIS — E1140 Type 2 diabetes mellitus with diabetic neuropathy, unspecified: Secondary | ICD-10-CM | POA: Diagnosis not present

## 2014-09-24 DIAGNOSIS — G4733 Obstructive sleep apnea (adult) (pediatric): Secondary | ICD-10-CM | POA: Diagnosis not present

## 2014-09-24 DIAGNOSIS — D869 Sarcoidosis, unspecified: Secondary | ICD-10-CM | POA: Diagnosis not present

## 2014-09-24 DIAGNOSIS — R0600 Dyspnea, unspecified: Secondary | ICD-10-CM | POA: Diagnosis not present

## 2014-09-24 DIAGNOSIS — J309 Allergic rhinitis, unspecified: Secondary | ICD-10-CM | POA: Diagnosis not present

## 2014-09-25 DIAGNOSIS — G4733 Obstructive sleep apnea (adult) (pediatric): Secondary | ICD-10-CM | POA: Diagnosis not present

## 2014-09-26 DIAGNOSIS — Z6841 Body Mass Index (BMI) 40.0 and over, adult: Secondary | ICD-10-CM | POA: Diagnosis not present

## 2014-09-26 DIAGNOSIS — R635 Abnormal weight gain: Secondary | ICD-10-CM | POA: Diagnosis not present

## 2014-09-26 DIAGNOSIS — Z79899 Other long term (current) drug therapy: Secondary | ICD-10-CM | POA: Diagnosis not present

## 2014-10-08 DIAGNOSIS — E119 Type 2 diabetes mellitus without complications: Secondary | ICD-10-CM | POA: Diagnosis not present

## 2014-10-08 DIAGNOSIS — Z79899 Other long term (current) drug therapy: Secondary | ICD-10-CM | POA: Diagnosis not present

## 2014-10-08 DIAGNOSIS — E039 Hypothyroidism, unspecified: Secondary | ICD-10-CM | POA: Diagnosis not present

## 2014-10-08 DIAGNOSIS — E1140 Type 2 diabetes mellitus with diabetic neuropathy, unspecified: Secondary | ICD-10-CM | POA: Diagnosis not present

## 2014-10-08 DIAGNOSIS — J309 Allergic rhinitis, unspecified: Secondary | ICD-10-CM | POA: Diagnosis not present

## 2014-10-08 DIAGNOSIS — E559 Vitamin D deficiency, unspecified: Secondary | ICD-10-CM | POA: Diagnosis not present

## 2014-10-08 DIAGNOSIS — Z6841 Body Mass Index (BMI) 40.0 and over, adult: Secondary | ICD-10-CM | POA: Diagnosis not present

## 2014-10-08 DIAGNOSIS — I10 Essential (primary) hypertension: Secondary | ICD-10-CM | POA: Diagnosis not present

## 2014-10-09 DIAGNOSIS — Z79899 Other long term (current) drug therapy: Secondary | ICD-10-CM | POA: Diagnosis not present

## 2014-10-09 DIAGNOSIS — Z794 Long term (current) use of insulin: Secondary | ICD-10-CM | POA: Diagnosis not present

## 2014-10-09 DIAGNOSIS — I10 Essential (primary) hypertension: Secondary | ICD-10-CM | POA: Diagnosis not present

## 2014-10-09 DIAGNOSIS — J329 Chronic sinusitis, unspecified: Secondary | ICD-10-CM | POA: Diagnosis not present

## 2014-10-09 DIAGNOSIS — J320 Chronic maxillary sinusitis: Secondary | ICD-10-CM | POA: Diagnosis not present

## 2014-10-09 DIAGNOSIS — J45909 Unspecified asthma, uncomplicated: Secondary | ICD-10-CM | POA: Diagnosis not present

## 2014-10-09 DIAGNOSIS — J342 Deviated nasal septum: Secondary | ICD-10-CM | POA: Diagnosis not present

## 2014-10-09 DIAGNOSIS — E119 Type 2 diabetes mellitus without complications: Secondary | ICD-10-CM | POA: Diagnosis not present

## 2014-10-09 DIAGNOSIS — J343 Hypertrophy of nasal turbinates: Secondary | ICD-10-CM | POA: Diagnosis not present

## 2014-10-09 DIAGNOSIS — R0981 Nasal congestion: Secondary | ICD-10-CM | POA: Diagnosis not present

## 2014-10-09 HISTORY — PX: SINOSCOPY: SHX187

## 2014-10-18 DIAGNOSIS — Z79899 Other long term (current) drug therapy: Secondary | ICD-10-CM | POA: Diagnosis not present

## 2014-10-18 DIAGNOSIS — E119 Type 2 diabetes mellitus without complications: Secondary | ICD-10-CM | POA: Diagnosis not present

## 2014-10-18 DIAGNOSIS — E039 Hypothyroidism, unspecified: Secondary | ICD-10-CM | POA: Diagnosis not present

## 2014-10-18 DIAGNOSIS — E559 Vitamin D deficiency, unspecified: Secondary | ICD-10-CM | POA: Diagnosis not present

## 2014-10-18 DIAGNOSIS — J329 Chronic sinusitis, unspecified: Secondary | ICD-10-CM | POA: Diagnosis not present

## 2014-10-18 DIAGNOSIS — E538 Deficiency of other specified B group vitamins: Secondary | ICD-10-CM | POA: Diagnosis not present

## 2014-10-22 DIAGNOSIS — J309 Allergic rhinitis, unspecified: Secondary | ICD-10-CM | POA: Diagnosis not present

## 2014-10-24 DIAGNOSIS — J9811 Atelectasis: Secondary | ICD-10-CM | POA: Diagnosis not present

## 2014-10-24 DIAGNOSIS — E039 Hypothyroidism, unspecified: Secondary | ICD-10-CM | POA: Diagnosis not present

## 2014-10-24 DIAGNOSIS — G4733 Obstructive sleep apnea (adult) (pediatric): Secondary | ICD-10-CM | POA: Diagnosis not present

## 2014-10-24 DIAGNOSIS — I10 Essential (primary) hypertension: Secondary | ICD-10-CM | POA: Diagnosis not present

## 2014-10-24 DIAGNOSIS — E1140 Type 2 diabetes mellitus with diabetic neuropathy, unspecified: Secondary | ICD-10-CM | POA: Diagnosis not present

## 2014-10-24 DIAGNOSIS — Z1389 Encounter for screening for other disorder: Secondary | ICD-10-CM | POA: Diagnosis not present

## 2014-10-24 DIAGNOSIS — D860 Sarcoidosis of lung: Secondary | ICD-10-CM | POA: Diagnosis not present

## 2014-10-24 DIAGNOSIS — E119 Type 2 diabetes mellitus without complications: Secondary | ICD-10-CM | POA: Diagnosis not present

## 2014-10-24 DIAGNOSIS — R071 Chest pain on breathing: Secondary | ICD-10-CM | POA: Diagnosis not present

## 2014-10-25 DIAGNOSIS — Z6841 Body Mass Index (BMI) 40.0 and over, adult: Secondary | ICD-10-CM | POA: Diagnosis not present

## 2014-10-25 DIAGNOSIS — R635 Abnormal weight gain: Secondary | ICD-10-CM | POA: Diagnosis not present

## 2014-10-25 DIAGNOSIS — Z79899 Other long term (current) drug therapy: Secondary | ICD-10-CM | POA: Diagnosis not present

## 2014-10-25 DIAGNOSIS — J329 Chronic sinusitis, unspecified: Secondary | ICD-10-CM | POA: Diagnosis not present

## 2014-10-25 DIAGNOSIS — G4733 Obstructive sleep apnea (adult) (pediatric): Secondary | ICD-10-CM | POA: Diagnosis not present

## 2014-10-29 ENCOUNTER — Ambulatory Visit (INDEPENDENT_AMBULATORY_CARE_PROVIDER_SITE_OTHER): Payer: Commercial Managed Care - HMO | Admitting: Podiatry

## 2014-10-29 ENCOUNTER — Encounter: Payer: Self-pay | Admitting: Podiatry

## 2014-10-29 VITALS — BP 125/77 | HR 104 | Resp 14

## 2014-10-29 DIAGNOSIS — G63 Polyneuropathy in diseases classified elsewhere: Secondary | ICD-10-CM

## 2014-10-29 DIAGNOSIS — M2021 Hallux rigidus, right foot: Secondary | ICD-10-CM

## 2014-10-29 DIAGNOSIS — E349 Endocrine disorder, unspecified: Secondary | ICD-10-CM | POA: Diagnosis not present

## 2014-10-29 DIAGNOSIS — M205X1 Other deformities of toe(s) (acquired), right foot: Secondary | ICD-10-CM

## 2014-10-29 NOTE — Progress Notes (Signed)
   Subjective:    Patient ID: Melanie Meyers, female    DOB: [birth year], 59 y.o.   MRN: [id number]  HPI This patient presented to my office with pain through her right foot and increased burning through both feet.  She says she experiences burning and tingling below her lower legswhich extends into her feet.  She says she has pain through her big toe right foot.  This pain occurs after walking and standing all day.  She does give a history of an accident through her big toe right foot. Patient is present here today for B/L feet pain since 6 years ago and is gradually getting worse. She was diagnosed at [facility](PCP) 6 years ago.    Review of Systems  All other systems reviewed and are negative.      Objective:   Physical Exam GENERAL APPEARANCE: Alert, conversant. Appropriately groomed. No acute distress.  VASCULAR: Pedal pulses palpable at  [facility] and PT bilateral.  Capillary refill time is immediate to all digits,  Normal temperature gradient.  Digital hair growth is present bilateral  NEUROLOGIC: sensation is normal to 5.07 monofilament at 5/5 sites bilateral.  Light touch is intact bilateral, Muscle strength normal.  MUSCULOSKELETAL: acceptable muscle strength, tone and stability bilateral.  Intrinsic muscluature intact bilateral.  Rectus appearance of foot and digits noted bilateral. Limited ROM 1st MPJ right foot when loaded.    DERMATOLOGIC: skin color, texture, and turgor are within normal limits.  No preulcerative lesions or ulcers  are seen, no interdigital maceration noted.  No open lesions present.  Digital nails are asymptomatic. No drainage noted.         Assessment & Plan:  Hallux Limitus 1st MPJ right foot   Diabetic neuropathy   IE>  To have her pick up her x-rays and deliver to [facility] office.  To talk to her MD for gabapentin treatment.  Patient was casted and charged for diabetic shoes from Dr. Irene Pap office.  Informed her she was only able to receive one  pair per year.

## 2014-10-31 DIAGNOSIS — Z8249 Family history of ischemic heart disease and other diseases of the circulatory system: Secondary | ICD-10-CM | POA: Diagnosis not present

## 2014-10-31 DIAGNOSIS — N62 Hypertrophy of breast: Secondary | ICD-10-CM | POA: Diagnosis not present

## 2014-11-05 DIAGNOSIS — G4733 Obstructive sleep apnea (adult) (pediatric): Secondary | ICD-10-CM | POA: Diagnosis not present

## 2014-11-05 DIAGNOSIS — D869 Sarcoidosis, unspecified: Secondary | ICD-10-CM | POA: Diagnosis not present

## 2014-11-05 DIAGNOSIS — R0600 Dyspnea, unspecified: Secondary | ICD-10-CM | POA: Diagnosis not present

## 2014-11-05 DIAGNOSIS — J309 Allergic rhinitis, unspecified: Secondary | ICD-10-CM | POA: Diagnosis not present

## 2014-11-24 DIAGNOSIS — J019 Acute sinusitis, unspecified: Secondary | ICD-10-CM | POA: Diagnosis not present

## 2014-11-25 DIAGNOSIS — G4733 Obstructive sleep apnea (adult) (pediatric): Secondary | ICD-10-CM | POA: Diagnosis not present

## 2014-11-26 DIAGNOSIS — R635 Abnormal weight gain: Secondary | ICD-10-CM | POA: Diagnosis not present

## 2014-11-26 DIAGNOSIS — Z6841 Body Mass Index (BMI) 40.0 and over, adult: Secondary | ICD-10-CM | POA: Diagnosis not present

## 2014-11-26 DIAGNOSIS — Z79899 Other long term (current) drug therapy: Secondary | ICD-10-CM | POA: Diagnosis not present

## 2014-12-03 DIAGNOSIS — J309 Allergic rhinitis, unspecified: Secondary | ICD-10-CM | POA: Diagnosis not present

## 2014-12-06 DIAGNOSIS — J309 Allergic rhinitis, unspecified: Secondary | ICD-10-CM | POA: Diagnosis not present

## 2014-12-06 DIAGNOSIS — Z6841 Body Mass Index (BMI) 40.0 and over, adult: Secondary | ICD-10-CM | POA: Diagnosis not present

## 2014-12-06 DIAGNOSIS — B001 Herpesviral vesicular dermatitis: Secondary | ICD-10-CM | POA: Diagnosis not present

## 2014-12-06 DIAGNOSIS — R5382 Chronic fatigue, unspecified: Secondary | ICD-10-CM | POA: Diagnosis not present

## 2014-12-06 DIAGNOSIS — E663 Overweight: Secondary | ICD-10-CM | POA: Diagnosis not present

## 2014-12-06 DIAGNOSIS — J45909 Unspecified asthma, uncomplicated: Secondary | ICD-10-CM | POA: Diagnosis not present

## 2014-12-24 DIAGNOSIS — M5430 Sciatica, unspecified side: Secondary | ICD-10-CM | POA: Diagnosis not present

## 2014-12-24 DIAGNOSIS — J309 Allergic rhinitis, unspecified: Secondary | ICD-10-CM | POA: Diagnosis not present

## 2014-12-25 DIAGNOSIS — G4733 Obstructive sleep apnea (adult) (pediatric): Secondary | ICD-10-CM | POA: Diagnosis not present

## 2014-12-27 DIAGNOSIS — Z79899 Other long term (current) drug therapy: Secondary | ICD-10-CM | POA: Diagnosis not present

## 2014-12-27 DIAGNOSIS — R635 Abnormal weight gain: Secondary | ICD-10-CM | POA: Diagnosis not present

## 2014-12-27 DIAGNOSIS — Z6838 Body mass index (BMI) 38.0-38.9, adult: Secondary | ICD-10-CM | POA: Diagnosis not present

## 2015-01-09 DIAGNOSIS — D869 Sarcoidosis, unspecified: Secondary | ICD-10-CM | POA: Diagnosis not present

## 2015-01-09 DIAGNOSIS — G5603 Carpal tunnel syndrome, bilateral upper limbs: Secondary | ICD-10-CM | POA: Diagnosis not present

## 2015-01-09 DIAGNOSIS — M5432 Sciatica, left side: Secondary | ICD-10-CM | POA: Diagnosis not present

## 2015-01-10 DIAGNOSIS — Z8709 Personal history of other diseases of the respiratory system: Secondary | ICD-10-CM | POA: Diagnosis not present

## 2015-01-10 DIAGNOSIS — J3489 Other specified disorders of nose and nasal sinuses: Secondary | ICD-10-CM | POA: Diagnosis not present

## 2015-01-10 DIAGNOSIS — Z9889 Other specified postprocedural states: Secondary | ICD-10-CM | POA: Diagnosis not present

## 2015-01-25 DIAGNOSIS — G4733 Obstructive sleep apnea (adult) (pediatric): Secondary | ICD-10-CM | POA: Diagnosis not present

## 2015-01-28 ENCOUNTER — Ambulatory Visit (INDEPENDENT_AMBULATORY_CARE_PROVIDER_SITE_OTHER): Payer: Commercial Managed Care - HMO | Admitting: Podiatry

## 2015-01-28 ENCOUNTER — Encounter: Payer: Self-pay | Admitting: Podiatry

## 2015-01-28 VITALS — BP 114/89 | HR 82 | Resp 14

## 2015-01-28 DIAGNOSIS — M21969 Unspecified acquired deformity of unspecified lower leg: Secondary | ICD-10-CM

## 2015-01-28 DIAGNOSIS — G63 Polyneuropathy in diseases classified elsewhere: Secondary | ICD-10-CM

## 2015-01-28 DIAGNOSIS — E349 Endocrine disorder, unspecified: Secondary | ICD-10-CM | POA: Diagnosis not present

## 2015-01-28 DIAGNOSIS — M2021 Hallux rigidus, right foot: Secondary | ICD-10-CM

## 2015-01-28 DIAGNOSIS — M205X1 Other deformities of toe(s) (acquired), right foot: Secondary | ICD-10-CM | POA: Diagnosis not present

## 2015-01-28 DIAGNOSIS — J309 Allergic rhinitis, unspecified: Secondary | ICD-10-CM | POA: Diagnosis not present

## 2015-01-28 NOTE — Progress Notes (Signed)
Subjective:     Patient ID: Melanie Meyers, female   DOB: April 08, 1955, 60 y.o.   MRN: [id number]  HPI this patient returns to the office follow-up for an evaluation of her diabetic neuropathy and foot pain. She was evaluated in October and diagnosed with a hallux limitus first MPJ both feet. She says she has been wearing her diabetic shoes and that her shoes are doing well. The diagnosis of her metatarsalgia has diminished. She says her neuropathy is about the same but she desires to proceed with obtaining a second pair of diabetic shoes. She presents for evaluation and treatment of her feet   Review of Systems     Objective:   Physical Exam GENERAL APPEARANCE: Alert, conversant. Appropriately groomed. No acute distress.  VASCULAR: Pedal pulses palpable at  [facility] and PT bilateral.  Capillary refill time is immediate to all digits,  Normal temperature gradient.  Digital hair growth is present bilateral  NEUROLOGIC: sensation is normal to 5.07 monofilament at 5/5 sites bilateral.  Light touch is intact bilateral, Muscle strength normal.  MUSCULOSKELETAL: acceptable muscle strength, tone and stability bilateral.  Intrinsic muscluature intact bilateral.  Rectus appearance of foot and digits noted bilateral. Hallux limitus 1st MPJ B/L  DERMATOLOGIC: skin color, texture, and turgor are within normal limits.  No preulcerative lesions or ulcers  are seen, no interdigital maceration noted.  No open lesions present.  Digital nails are asymptomatic. No drainage noted.      Assessment:     Hallux limitus 1st MPJ B/L  Diabetic neuropthy     Plan:     ROV  Discussed her foot conditions and we decided to obtain another pair of diabetic shoes.  She will be having surgery soon and she asked me to hold off on ordering these shoes until the end of March.   Gardiner Barefoot DPM

## 2015-01-30 DIAGNOSIS — Z01818 Encounter for other preprocedural examination: Secondary | ICD-10-CM | POA: Diagnosis not present

## 2015-01-30 DIAGNOSIS — D869 Sarcoidosis, unspecified: Secondary | ICD-10-CM | POA: Diagnosis not present

## 2015-01-30 DIAGNOSIS — G4739 Other sleep apnea: Secondary | ICD-10-CM | POA: Diagnosis not present

## 2015-01-30 DIAGNOSIS — Z79899 Other long term (current) drug therapy: Secondary | ICD-10-CM | POA: Diagnosis not present

## 2015-01-30 DIAGNOSIS — Z6838 Body mass index (BMI) 38.0-38.9, adult: Secondary | ICD-10-CM | POA: Diagnosis not present

## 2015-01-30 DIAGNOSIS — M7051 Other bursitis of knee, right knee: Secondary | ICD-10-CM | POA: Diagnosis not present

## 2015-01-30 DIAGNOSIS — D692 Other nonthrombocytopenic purpura: Secondary | ICD-10-CM | POA: Diagnosis not present

## 2015-01-30 DIAGNOSIS — E119 Type 2 diabetes mellitus without complications: Secondary | ICD-10-CM | POA: Diagnosis not present

## 2015-02-04 ENCOUNTER — Ambulatory Visit (INDEPENDENT_AMBULATORY_CARE_PROVIDER_SITE_OTHER): Payer: Commercial Managed Care - HMO | Admitting: Podiatry

## 2015-02-04 ENCOUNTER — Encounter: Payer: Self-pay | Admitting: Podiatry

## 2015-02-04 VITALS — BP 122/71 | HR 65 | Resp 14

## 2015-02-04 DIAGNOSIS — L600 Ingrowing nail: Secondary | ICD-10-CM | POA: Diagnosis not present

## 2015-02-04 DIAGNOSIS — L03031 Cellulitis of right toe: Secondary | ICD-10-CM

## 2015-02-04 MED ORDER — CEPHALEXIN 500 MG PO CAPS: 500.0000 mg | ORAL_CAPSULE | Freq: Two times a day (BID) | ORAL | Status: DC

## 2015-02-04 NOTE — Progress Notes (Signed)
Subjective:     Patient ID: Melanie Meyers, female   DOB: 01-07-1955, 60 y.o.   MRN: [id number]  HPI this patient returns to the office saying she still has pain and discomfort along the inside border big toe, right foot. She relates drainage has been persisting for the last few weeks. She desires to proceed with surgical correction of the ingrowing toenail inside border right big toe   Review of Systems     Objective:   Physical Exam Physical Exam GENERAL APPEARANCE: Alert, conversant. Appropriately groomed. No acute distress.  VASCULAR: Pedal pulses palpable at [facility] and PT bilateral. Capillary refill time is immediate to all digits, Normal temperature gradient. Digital hair growth is present bilateral  NEUROLOGIC: sensation is normal to 5.07 monofilament at 5/5 sites bilateral. Light touch is intact bilateral, Muscle strength normal.  MUSCULOSKELETAL: acceptable muscle strength, tone and stability bilateral. Intrinsic muscluature intact bilateral. Rectus appearance of foot and digits noted bilateral. Hallux limitus 1st MPJ B/L  DERMATOLOGIC: skin color, texture, and turgor are within normal limits. No preulcerative lesions or ulcers are seen, no interdigital maceration noted. No open lesions present. No drainage noted Nails  Marked incurvation noted big toe medial border.  Mild redness and swelling present but pain elicited with palpation.     Assessment:     Ingrown Toenail right hallux      Plan:     ROV  Nail surgery.Treatment options and alternatives discussed.  Recommended permanent phenol matrixectomy and patient agreed.  Right  was prepped with alcohol and a toe block of 3cc of 2% lidocaine plain was administered in a digital toe block. .  The toe was then prepped with betadine solution .  The offending nail border was then excised and matrix tissue exposed.  Phenol was then applied to the matrix tissue followed by an alcohol wash.  Antibiotic ointment and a dry  sterile dressing was applied.  The patient was dispensed instructions for aftercare. RTC 1 week.     Gardiner Barefoot DPM

## 2015-02-11 ENCOUNTER — Encounter: Payer: Self-pay | Admitting: Podiatry

## 2015-02-11 ENCOUNTER — Ambulatory Visit (INDEPENDENT_AMBULATORY_CARE_PROVIDER_SITE_OTHER): Payer: Commercial Managed Care - HMO | Admitting: Podiatry

## 2015-02-11 VITALS — BP 134/72 | HR 72 | Resp 14

## 2015-02-11 DIAGNOSIS — Z09 Encounter for follow-up examination after completed treatment for conditions other than malignant neoplasm: Secondary | ICD-10-CM

## 2015-02-11 MED ORDER — CEPHALEXIN 500 MG PO CAPS: 500.0000 mg | ORAL_CAPSULE | Freq: Two times a day (BID) | ORAL | Status: DC

## 2015-02-11 NOTE — Progress Notes (Signed)
Patient ID: Melanie Meyers, female   DOB: 11/06/1955, 60 y.o.   MRN: [id number] This patient returns to the office following nail surgery one week ago.  The patient says toe has been soaked and bandaged as directed.  There has been improvement of the toe since the surgery has been performed. The patient presents for continued evaluation and treatment. There is redness at tip of nail but no infection noted.  GENERAL APPEARANCE: Alert, conversant. Appropriately groomed. No acute distress.  VASCULAR: Pedal pulses palpable at  [facility] and PT bilateral.  Capillary refill time is immediate to all digits,  Normal temperature gradient.    NEUROLOGIC: sensation is normal to 5.07 monofilament at 5/5 sites bilateral.  Light touch is intact bilateral, Muscle strength normal.  MUSCULOSKELETAL: acceptable muscle strength, tone and stability bilateral.  Intrinsic muscluature intact bilateral.  Rectus appearance of foot and digits noted bilateral.   DERMATOLOGIC: skin color, texture, and turgor are within normal limits.  No preulcerative lesions or ulcers  are seen, no interdigital maceration noted.   NAILS  There is necrotic tissue along the nail groove  In the absence of  swelling and pain.  DX  S/p nail surgery  ROV  Home instructions were discussed.  Patient to call the office if there are any questions or concerns. Prescribed cephalexin to prevent infection due to her scheduled knee surgery in one week.   Gardiner Barefoot DPM

## 2015-02-11 NOTE — Addendum Note (Signed)
Addended by: Ezzard Flax, Lonzo Saulter L on: 02/11/2015 11:14 AM   Modules accepted: Orders

## 2015-02-14 DIAGNOSIS — J309 Allergic rhinitis, unspecified: Secondary | ICD-10-CM | POA: Diagnosis not present

## 2015-02-18 DIAGNOSIS — E039 Hypothyroidism, unspecified: Secondary | ICD-10-CM | POA: Diagnosis not present

## 2015-02-18 DIAGNOSIS — N6012 Diffuse cystic mastopathy of left breast: Secondary | ICD-10-CM | POA: Diagnosis not present

## 2015-02-18 DIAGNOSIS — E669 Obesity, unspecified: Secondary | ICD-10-CM | POA: Diagnosis not present

## 2015-02-18 DIAGNOSIS — E119 Type 2 diabetes mellitus without complications: Secondary | ICD-10-CM | POA: Diagnosis not present

## 2015-02-18 DIAGNOSIS — I10 Essential (primary) hypertension: Secondary | ICD-10-CM | POA: Diagnosis not present

## 2015-02-18 DIAGNOSIS — Z6838 Body mass index (BMI) 38.0-38.9, adult: Secondary | ICD-10-CM | POA: Diagnosis not present

## 2015-02-18 DIAGNOSIS — M542 Cervicalgia: Secondary | ICD-10-CM | POA: Diagnosis not present

## 2015-02-18 DIAGNOSIS — N6011 Diffuse cystic mastopathy of right breast: Secondary | ICD-10-CM | POA: Diagnosis not present

## 2015-02-18 DIAGNOSIS — E785 Hyperlipidemia, unspecified: Secondary | ICD-10-CM | POA: Diagnosis not present

## 2015-02-18 DIAGNOSIS — M549 Dorsalgia, unspecified: Secondary | ICD-10-CM | POA: Diagnosis not present

## 2015-02-18 DIAGNOSIS — N62 Hypertrophy of breast: Secondary | ICD-10-CM | POA: Diagnosis not present

## 2015-02-18 HISTORY — PX: BREAST REDUCTION SURGERY: SHX8

## 2015-02-19 DIAGNOSIS — E119 Type 2 diabetes mellitus without complications: Secondary | ICD-10-CM | POA: Diagnosis not present

## 2015-02-19 DIAGNOSIS — E785 Hyperlipidemia, unspecified: Secondary | ICD-10-CM | POA: Diagnosis not present

## 2015-02-19 DIAGNOSIS — E669 Obesity, unspecified: Secondary | ICD-10-CM | POA: Diagnosis not present

## 2015-02-19 DIAGNOSIS — M542 Cervicalgia: Secondary | ICD-10-CM | POA: Diagnosis not present

## 2015-02-19 DIAGNOSIS — E039 Hypothyroidism, unspecified: Secondary | ICD-10-CM | POA: Diagnosis not present

## 2015-02-19 DIAGNOSIS — G4733 Obstructive sleep apnea (adult) (pediatric): Secondary | ICD-10-CM | POA: Diagnosis not present

## 2015-02-19 DIAGNOSIS — M549 Dorsalgia, unspecified: Secondary | ICD-10-CM | POA: Diagnosis not present

## 2015-02-19 DIAGNOSIS — N62 Hypertrophy of breast: Secondary | ICD-10-CM | POA: Diagnosis not present

## 2015-02-19 DIAGNOSIS — Z6838 Body mass index (BMI) 38.0-38.9, adult: Secondary | ICD-10-CM | POA: Diagnosis not present

## 2015-02-19 DIAGNOSIS — I10 Essential (primary) hypertension: Secondary | ICD-10-CM | POA: Diagnosis not present

## 2015-02-22 ENCOUNTER — Telehealth: Payer: Self-pay | Admitting: *Deleted

## 2015-02-22 MED ORDER — FLUCONAZOLE 150 MG PO TABS: 150.0000 mg | ORAL_TABLET | Freq: Once | ORAL | Status: DC

## 2015-02-22 NOTE — Telephone Encounter (Addendum)
-----   Message from Melanie Meyers sent at 02/21/2015  1:41 PM EST ----- Patient feels that she has a yeast infection after taking Keflex.  She would like a script called in to her pharmacy.  02/22/2015-DR. MAYER OKAYED DIFLUCAN 150MG  #1 TAKE AS DIRECTED.  Orders to pt and pharmacy.

## 2015-03-13 DIAGNOSIS — E039 Hypothyroidism, unspecified: Secondary | ICD-10-CM | POA: Diagnosis not present

## 2015-03-13 DIAGNOSIS — Z79899 Other long term (current) drug therapy: Secondary | ICD-10-CM | POA: Diagnosis not present

## 2015-03-13 DIAGNOSIS — I10 Essential (primary) hypertension: Secondary | ICD-10-CM | POA: Diagnosis not present

## 2015-03-13 DIAGNOSIS — G4700 Insomnia, unspecified: Secondary | ICD-10-CM | POA: Diagnosis not present

## 2015-03-13 DIAGNOSIS — E1149 Type 2 diabetes mellitus with other diabetic neurological complication: Secondary | ICD-10-CM | POA: Diagnosis not present

## 2015-03-13 DIAGNOSIS — E559 Vitamin D deficiency, unspecified: Secondary | ICD-10-CM | POA: Diagnosis not present

## 2015-03-20 ENCOUNTER — Telehealth: Payer: Self-pay | Admitting: *Deleted

## 2015-03-20 NOTE — Telephone Encounter (Addendum)
-----   Message from Kathlyn [place] sent at 03/20/2015  8:27 AM EDT ----- Regarding: MEDICAL QUESTION Contact: [phone] SIX WKS AGO DR. Prudence Davidson REMOVED INGROWN TOENAIL ON THE RIGHT GREAT TOE.  NOW TOENAIL IS COMING OFF AND PT IS CONCERNED AS TO WHETHER THIS IS NORMAL.  IF SHE CANNOT BE REACHED AT THE ABOVE PHONE #, SHE CAN BE REACHED @ [phone].  03/20/2015-Informed pt that on occasion the toenail will come off after a nail procedure, to continue the after care ordered by Dr. Prudence Davidson until the area has a dry hard scab without redness, drainage or swelling.  Then continue with a dry bandaid to cover the toenail until it grows all the way out, and make an appt if there is any bleeding, redness, drainage or swelling. Pt states understanding.

## 2015-03-28 DIAGNOSIS — M542 Cervicalgia: Secondary | ICD-10-CM | POA: Diagnosis not present

## 2015-03-29 DIAGNOSIS — G5603 Carpal tunnel syndrome, bilateral upper limbs: Secondary | ICD-10-CM | POA: Diagnosis not present

## 2015-03-29 DIAGNOSIS — M19011 Primary osteoarthritis, right shoulder: Secondary | ICD-10-CM | POA: Diagnosis not present

## 2015-03-29 DIAGNOSIS — M791 Myalgia: Secondary | ICD-10-CM | POA: Diagnosis not present

## 2015-03-29 DIAGNOSIS — R937 Abnormal findings on diagnostic imaging of other parts of musculoskeletal system: Secondary | ICD-10-CM | POA: Diagnosis not present

## 2015-03-29 DIAGNOSIS — M25511 Pain in right shoulder: Secondary | ICD-10-CM | POA: Diagnosis not present

## 2015-04-08 DIAGNOSIS — G5603 Carpal tunnel syndrome, bilateral upper limbs: Secondary | ICD-10-CM | POA: Diagnosis not present

## 2015-04-08 DIAGNOSIS — G5601 Carpal tunnel syndrome, right upper limb: Secondary | ICD-10-CM | POA: Diagnosis not present

## 2015-04-15 DIAGNOSIS — E669 Obesity, unspecified: Secondary | ICD-10-CM | POA: Diagnosis not present

## 2015-04-15 DIAGNOSIS — F419 Anxiety disorder, unspecified: Secondary | ICD-10-CM | POA: Diagnosis not present

## 2015-04-15 DIAGNOSIS — Z6837 Body mass index (BMI) 37.0-37.9, adult: Secondary | ICD-10-CM | POA: Diagnosis not present

## 2015-04-15 DIAGNOSIS — I10 Essential (primary) hypertension: Secondary | ICD-10-CM | POA: Diagnosis not present

## 2015-04-15 DIAGNOSIS — G4733 Obstructive sleep apnea (adult) (pediatric): Secondary | ICD-10-CM | POA: Diagnosis not present

## 2015-04-15 DIAGNOSIS — R002 Palpitations: Secondary | ICD-10-CM | POA: Diagnosis not present

## 2015-04-15 DIAGNOSIS — E1149 Type 2 diabetes mellitus with other diabetic neurological complication: Secondary | ICD-10-CM | POA: Diagnosis not present

## 2015-04-15 DIAGNOSIS — K589 Irritable bowel syndrome without diarrhea: Secondary | ICD-10-CM | POA: Diagnosis not present

## 2015-04-23 ENCOUNTER — Telehealth: Payer: Self-pay | Admitting: *Deleted

## 2015-04-23 NOTE — Telephone Encounter (Signed)
Dr Prudence Davidson I do not have any paperwork on file for this patient and do not see any documentation in the notes.  Please review and advise

## 2015-04-23 NOTE — Telephone Encounter (Signed)
Pt states last office visit Dr. Prudence Davidson was to order shoes, but has not heard anything.

## 2015-04-26 DIAGNOSIS — M542 Cervicalgia: Secondary | ICD-10-CM | POA: Diagnosis not present

## 2015-05-02 DIAGNOSIS — Z9013 Acquired absence of bilateral breasts and nipples: Secondary | ICD-10-CM | POA: Diagnosis not present

## 2015-05-06 DIAGNOSIS — E663 Overweight: Secondary | ICD-10-CM | POA: Diagnosis not present

## 2015-05-06 DIAGNOSIS — M549 Dorsalgia, unspecified: Secondary | ICD-10-CM | POA: Diagnosis not present

## 2015-05-06 DIAGNOSIS — Z6838 Body mass index (BMI) 38.0-38.9, adult: Secondary | ICD-10-CM | POA: Diagnosis not present

## 2015-05-06 DIAGNOSIS — G43909 Migraine, unspecified, not intractable, without status migrainosus: Secondary | ICD-10-CM | POA: Diagnosis not present

## 2015-05-06 DIAGNOSIS — G5601 Carpal tunnel syndrome, right upper limb: Secondary | ICD-10-CM | POA: Diagnosis not present

## 2015-05-06 DIAGNOSIS — G4733 Obstructive sleep apnea (adult) (pediatric): Secondary | ICD-10-CM | POA: Diagnosis not present

## 2015-05-15 DIAGNOSIS — M5412 Radiculopathy, cervical region: Secondary | ICD-10-CM | POA: Diagnosis not present

## 2015-05-17 DIAGNOSIS — M50221 Other cervical disc displacement at C4-C5 level: Secondary | ICD-10-CM | POA: Diagnosis not present

## 2015-05-17 DIAGNOSIS — M5412 Radiculopathy, cervical region: Secondary | ICD-10-CM | POA: Diagnosis not present

## 2015-05-17 DIAGNOSIS — M47812 Spondylosis without myelopathy or radiculopathy, cervical region: Secondary | ICD-10-CM | POA: Diagnosis not present

## 2015-05-17 DIAGNOSIS — M4802 Spinal stenosis, cervical region: Secondary | ICD-10-CM | POA: Diagnosis not present

## 2015-05-17 DIAGNOSIS — M50223 Other cervical disc displacement at C6-C7 level: Secondary | ICD-10-CM | POA: Diagnosis not present

## 2015-05-22 DIAGNOSIS — M5412 Radiculopathy, cervical region: Secondary | ICD-10-CM | POA: Diagnosis not present

## 2015-05-27 DIAGNOSIS — M542 Cervicalgia: Secondary | ICD-10-CM | POA: Diagnosis not present

## 2015-06-06 DIAGNOSIS — R918 Other nonspecific abnormal finding of lung field: Secondary | ICD-10-CM | POA: Diagnosis not present

## 2015-06-06 DIAGNOSIS — D860 Sarcoidosis of lung: Secondary | ICD-10-CM | POA: Diagnosis not present

## 2015-06-06 DIAGNOSIS — G4733 Obstructive sleep apnea (adult) (pediatric): Secondary | ICD-10-CM | POA: Diagnosis not present

## 2015-06-06 DIAGNOSIS — R0600 Dyspnea, unspecified: Secondary | ICD-10-CM | POA: Diagnosis not present

## 2015-06-12 DIAGNOSIS — G4733 Obstructive sleep apnea (adult) (pediatric): Secondary | ICD-10-CM | POA: Diagnosis not present

## 2015-06-13 DIAGNOSIS — E559 Vitamin D deficiency, unspecified: Secondary | ICD-10-CM | POA: Diagnosis not present

## 2015-06-19 DIAGNOSIS — H43313 Vitreous membranes and strands, bilateral: Secondary | ICD-10-CM | POA: Diagnosis not present

## 2015-06-19 DIAGNOSIS — H43393 Other vitreous opacities, bilateral: Secondary | ICD-10-CM | POA: Diagnosis not present

## 2015-06-19 DIAGNOSIS — D869 Sarcoidosis, unspecified: Secondary | ICD-10-CM | POA: Diagnosis not present

## 2015-06-19 DIAGNOSIS — Z794 Long term (current) use of insulin: Secondary | ICD-10-CM | POA: Diagnosis not present

## 2015-06-19 DIAGNOSIS — R918 Other nonspecific abnormal finding of lung field: Secondary | ICD-10-CM | POA: Diagnosis not present

## 2015-06-19 DIAGNOSIS — Z7984 Long term (current) use of oral hypoglycemic drugs: Secondary | ICD-10-CM | POA: Diagnosis not present

## 2015-06-19 DIAGNOSIS — H524 Presbyopia: Secondary | ICD-10-CM | POA: Diagnosis not present

## 2015-06-19 DIAGNOSIS — H43813 Vitreous degeneration, bilateral: Secondary | ICD-10-CM | POA: Diagnosis not present

## 2015-06-19 DIAGNOSIS — E119 Type 2 diabetes mellitus without complications: Secondary | ICD-10-CM | POA: Diagnosis not present

## 2015-06-19 DIAGNOSIS — R591 Generalized enlarged lymph nodes: Secondary | ICD-10-CM | POA: Diagnosis not present

## 2015-06-19 DIAGNOSIS — H5203 Hypermetropia, bilateral: Secondary | ICD-10-CM | POA: Diagnosis not present

## 2015-06-19 DIAGNOSIS — H52223 Regular astigmatism, bilateral: Secondary | ICD-10-CM | POA: Diagnosis not present

## 2015-06-20 ENCOUNTER — Encounter: Payer: Self-pay | Admitting: Sports Medicine

## 2015-06-20 ENCOUNTER — Ambulatory Visit (INDEPENDENT_AMBULATORY_CARE_PROVIDER_SITE_OTHER): Payer: Commercial Managed Care - HMO | Admitting: Sports Medicine

## 2015-06-20 DIAGNOSIS — I10 Essential (primary) hypertension: Secondary | ICD-10-CM | POA: Insufficient documentation

## 2015-06-20 DIAGNOSIS — M79671 Pain in right foot: Secondary | ICD-10-CM

## 2015-06-20 DIAGNOSIS — M79672 Pain in left foot: Secondary | ICD-10-CM

## 2015-06-20 DIAGNOSIS — E785 Hyperlipidemia, unspecified: Secondary | ICD-10-CM

## 2015-06-20 DIAGNOSIS — E349 Endocrine disorder, unspecified: Secondary | ICD-10-CM

## 2015-06-20 DIAGNOSIS — G63 Polyneuropathy in diseases classified elsewhere: Secondary | ICD-10-CM

## 2015-06-20 DIAGNOSIS — E119 Type 2 diabetes mellitus without complications: Secondary | ICD-10-CM

## 2015-06-20 HISTORY — DX: Hyperlipidemia, unspecified: E78.5

## 2015-06-20 HISTORY — DX: Essential (primary) hypertension: I10

## 2015-06-20 NOTE — Progress Notes (Signed)
Patient ID: Melanie Meyers, female   DOB: 1955-10-31, 60 y.o.   MRN: [id number] Subjective: Melanie Meyers is a 60 y.o. female patient with history of diabetes who presents to office today stating that her feet hurt and that she needs new shoes; reports that she gets the most pain relief when she is supported in good Diabetic shoes. Desires new pair. Patient states that the glucose reading this morning was not recorded, but A1c 6.2. Patient denies any new changes in medication or new problems. Patient denies any new cramping, numbness, burning or tingling in the legs. Admits to baseline numbness in both legs.  Patient Active Problem List   Diagnosis Date Noted  . BP (high blood pressure) 06/20/2015  . HLD (hyperlipidemia) 06/20/2015  . Morbid obesity ([place]) 06/20/2015  . Metatarsal deformity 05/30/2014  . Metatarsalgia 05/30/2014  . Neuropathy associated with endocrine disorder ([place]) 05/30/2014  . Hand paresthesia 06/27/2013  . Adult hypothyroidism 06/02/2013  . Type 2 diabetes mellitus ([place]) 06/02/2013  . 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 Nerve root pain 01/27/2013  . Narrowing of intervertebral disc space 01/27/2013  . Myofascial pain 01/27/2013  . Cervical pain 11/24/2012  . Cervical osteoarthritis 11/24/2012  . Carpal tunnel syndrome 01/15/2012  . Airway hyperreactivity 01/20/2008   Current Outpatient Prescriptions on File Prior to Visit  Medication Sig Dispense Refill  . ACCU-CHEK AVIVA PLUS test strip     . aspirin EC 81 MG tablet Take 81 mg by mouth.    Durwin Reges 5-2.5-18.5 injection   0  . cephALEXin (KEFLEX) 500 MG capsule Take 1 capsule (500 mg total) by mouth 2 (two) times daily. 15 capsule 0  . citalopram (CELEXA) 20 MG tablet     . cyanocobalamin (,VITAMIN B-12,) 1000 MCG/ML injection Inject as directed. 1 or 2 injections monthly    . fluconazole (DIFLUCAN) 150 MG tablet Take 1 tablet (150 mg total) by mouth once. 1  tablet 0  . LANTUS SOLOSTAR 100 UNIT/ML Solostar Pen     . levothyroxine (SYNTHROID, LEVOTHROID) 50 MCG tablet     . losartan (COZAAR) 25 MG tablet     . metFORMIN (GLUCOPHAGE) 1000 MG tablet     . mometasone (NASONEX) 50 MCG/ACT nasal spray     . montelukast (SINGULAIR) 10 MG tablet     . naproxen (NAPROSYN) 500 MG tablet     . Omega-3 Fatty Acids (FISH OIL) 1200 MG CAPS Take by mouth.    . ondansetron (ZOFRAN-ODT) 4 MG disintegrating tablet     . oxyCODONE (OXY IR/ROXICODONE) 5 MG immediate release tablet     . polyethylene glycol powder (GLYCOLAX/MIRALAX) powder     . pravastatin (PRAVACHOL) 10 MG tablet Take 10 mg by mouth.    . pregabalin (LYRICA) 75 MG capsule Two (2) times a day.     Marland Kitchen QVAR 40 MCG/ACT inhaler     . Silica (SILICON DIOXIDE) POWD Take by mouth.    . traZODone (DESYREL) 50 MG tablet Take 50 mg by mouth.    Marland Kitchen UNIFINE PENTIPS 31G X 6 MM MISC     . VENTOLIN HFA 108 (90 BASE) MCG/ACT inhaler     . Vitamin D, Ergocalciferol, (DRISDOL) 50000 UNITS CAPS capsule Take by mouth.     No current facility-administered medications on file prior to visit.   Allergies  Allergen Reactions  . Accupril [Quinapril Hcl]   . Levaquin [Levofloxacin]   . Neurontin [Gabapentin]   .  Prednisone   . Topamax [Topiramate]     No results found for this or any previous visit (from the past 2160 hour(s)).  Objective: General: Patient is awake, alert, and oriented x 3 and in no acute distress.  Integument: Skin is warm, dry and supple bilateral. Nails are short, Asymptomatic. No signs of infection. No open lesions or preulcerative lesions present bilateral. History of right toe callus which remains resolved and previous permanent nail avulsion, right hallux medial nail margin, well-healed. Remaining integument unremarkable.  Vasculature:  Dorsalis Pedis pulse 2/4 bilateral. Posterior Tibial pulse  1/4 bilateral.  Capillary fill time <3 sec 1-5 bilateral. Scant hair growth to the level of  the digits. Temperature gradient within normal limits. Mild varicosities present bilateral. No edema present bilateral.   Neurology: The patient has intact sensation measured with a 5.07/10g Semmes Weinstein Monofilament at all pedal sites bilateral. Vibratory sensation diminished bilateral with tuning fork. No Babinski sign present bilateral. Subjective numbness bilateral.  Musculoskeletal: No symptomatic pedal deformities noted bilateral. Muscular strength 5/5 in all lower extremity muscular groups bilateral without pain on range of motion. Planus foot type bilateral. No tenderness with calf compression bilateral.  Assessment and Plan: Problem List Items Addressed This Visit      Nervous and Auditory   Neuropathy associated with endocrine disorder ([place])    Other Visit Diagnoses    Comprehensive diabetic foot examination, type 2 DM, encounter for [facility])    -  Primary    Foot pain, bilateral          -Examined patient. -Discussed and educated patient on diabetic foot care, especially with  regards to the vascular, neurological and musculoskeletal systems.  -Stressed the importance of good glycemic control and the detriment of not  controlling glucose levels in relation to the foot. -[employer] diabetic shoe order form was completed; office to contact primary care for approval / certification;  Office to arrange shoe fitting and dispensing. -Answered all patient questions -Patient to return to office for measurement/fitting as scheduled -Patient advised to call the office if any problems or questions arise in the meantime.  Landis Martins, DPM

## 2015-06-27 DIAGNOSIS — M542 Cervicalgia: Secondary | ICD-10-CM | POA: Diagnosis not present

## 2015-07-12 DIAGNOSIS — Z6839 Body mass index (BMI) 39.0-39.9, adult: Secondary | ICD-10-CM | POA: Diagnosis not present

## 2015-07-12 DIAGNOSIS — J309 Allergic rhinitis, unspecified: Secondary | ICD-10-CM | POA: Diagnosis not present

## 2015-07-12 DIAGNOSIS — G43909 Migraine, unspecified, not intractable, without status migrainosus: Secondary | ICD-10-CM | POA: Diagnosis not present

## 2015-07-12 DIAGNOSIS — F5104 Psychophysiologic insomnia: Secondary | ICD-10-CM | POA: Diagnosis not present

## 2015-07-12 DIAGNOSIS — J019 Acute sinusitis, unspecified: Secondary | ICD-10-CM | POA: Diagnosis not present

## 2015-07-16 DIAGNOSIS — G4733 Obstructive sleep apnea (adult) (pediatric): Secondary | ICD-10-CM | POA: Diagnosis not present

## 2015-07-17 DIAGNOSIS — F5104 Psychophysiologic insomnia: Secondary | ICD-10-CM | POA: Diagnosis not present

## 2015-07-17 DIAGNOSIS — J309 Allergic rhinitis, unspecified: Secondary | ICD-10-CM | POA: Diagnosis not present

## 2015-07-17 DIAGNOSIS — Z6841 Body Mass Index (BMI) 40.0 and over, adult: Secondary | ICD-10-CM | POA: Diagnosis not present

## 2015-07-17 DIAGNOSIS — F419 Anxiety disorder, unspecified: Secondary | ICD-10-CM | POA: Diagnosis not present

## 2015-07-17 DIAGNOSIS — K5900 Constipation, unspecified: Secondary | ICD-10-CM | POA: Diagnosis not present

## 2015-07-17 DIAGNOSIS — I10 Essential (primary) hypertension: Secondary | ICD-10-CM | POA: Diagnosis not present

## 2015-07-17 DIAGNOSIS — G43909 Migraine, unspecified, not intractable, without status migrainosus: Secondary | ICD-10-CM | POA: Diagnosis not present

## 2015-07-22 DIAGNOSIS — G5621 Lesion of ulnar nerve, right upper limb: Secondary | ICD-10-CM | POA: Diagnosis not present

## 2015-07-22 DIAGNOSIS — G5601 Carpal tunnel syndrome, right upper limb: Secondary | ICD-10-CM | POA: Diagnosis not present

## 2015-07-22 DIAGNOSIS — M67331 Transient synovitis, right wrist: Secondary | ICD-10-CM | POA: Diagnosis not present

## 2015-07-24 DIAGNOSIS — R51 Headache: Secondary | ICD-10-CM | POA: Diagnosis not present

## 2015-07-24 DIAGNOSIS — R4781 Slurred speech: Secondary | ICD-10-CM | POA: Diagnosis not present

## 2015-07-24 DIAGNOSIS — R4182 Altered mental status, unspecified: Secondary | ICD-10-CM | POA: Diagnosis not present

## 2015-07-25 ENCOUNTER — Emergency Department (HOSPITAL_COMMUNITY): Payer: Commercial Managed Care - HMO

## 2015-07-25 ENCOUNTER — Emergency Department (HOSPITAL_COMMUNITY)
Admission: EM | Admit: 2015-07-25 | Discharge: 2015-07-25 | Disposition: A | Discharge status: Home / Self Care | Payer: Commercial Managed Care - HMO | Attending: Emergency Medicine | Admitting: Emergency Medicine

## 2015-07-25 ENCOUNTER — Encounter (HOSPITAL_COMMUNITY): Payer: Self-pay

## 2015-07-25 DIAGNOSIS — R4182 Altered mental status, unspecified: Secondary | ICD-10-CM | POA: Insufficient documentation

## 2015-07-25 DIAGNOSIS — R404 Transient alteration of awareness: Secondary | ICD-10-CM

## 2015-07-25 DIAGNOSIS — I10 Essential (primary) hypertension: Secondary | ICD-10-CM | POA: Insufficient documentation

## 2015-07-25 DIAGNOSIS — R4781 Slurred speech: Secondary | ICD-10-CM | POA: Diagnosis not present

## 2015-07-25 DIAGNOSIS — Z79899 Other long term (current) drug therapy: Secondary | ICD-10-CM | POA: Diagnosis not present

## 2015-07-25 DIAGNOSIS — R4189 Other symptoms and signs involving cognitive functions and awareness: Secondary | ICD-10-CM

## 2015-07-25 DIAGNOSIS — R4020 Unspecified coma: Secondary | ICD-10-CM | POA: Diagnosis not present

## 2015-07-25 DIAGNOSIS — R791 Abnormal coagulation profile: Secondary | ICD-10-CM | POA: Diagnosis not present

## 2015-07-25 DIAGNOSIS — E119 Type 2 diabetes mellitus without complications: Secondary | ICD-10-CM | POA: Diagnosis not present

## 2015-07-25 DIAGNOSIS — Z7984 Long term (current) use of oral hypoglycemic drugs: Secondary | ICD-10-CM | POA: Diagnosis not present

## 2015-07-25 DIAGNOSIS — Z7982 Long term (current) use of aspirin: Secondary | ICD-10-CM | POA: Diagnosis not present

## 2015-07-25 HISTORY — DX: Essential (primary) hypertension: I10

## 2015-07-25 HISTORY — DX: Disorder of thyroid, unspecified: E07.9

## 2015-07-25 HISTORY — DX: Sarcoidosis, unspecified: D86.9

## 2015-07-25 HISTORY — DX: Pure hypercholesterolemia, unspecified: E78.00

## 2015-07-25 HISTORY — DX: Type 2 diabetes mellitus without complications: E11.9

## 2015-07-25 HISTORY — DX: Gastro-esophageal reflux disease without esophagitis: K21.9

## 2015-07-25 LAB — URINALYSIS, ROUTINE W REFLEX MICROSCOPIC
BILIRUBIN URINE: NEGATIVE
GLUCOSE, UA: NEGATIVE mg/dL
HGB URINE DIPSTICK: NEGATIVE
KETONES UR: NEGATIVE mg/dL
Nitrite: NEGATIVE
PH: 5.5 (ref 5.0–8.0)
Protein, ur: NEGATIVE mg/dL
Specific Gravity, Urine: 1.025 (ref 1.005–1.030)

## 2015-07-25 LAB — CBC
HEMATOCRIT: 38.1 % (ref 36.0–46.0)
Hemoglobin: 12.2 g/dL (ref 12.0–15.0)
MCH: 26.2 pg (ref 26.0–34.0)
MCHC: 32 g/dL (ref 30.0–36.0)
MCV: 81.8 fL (ref 78.0–100.0)
PLATELETS: 280 10*3/uL (ref 150–400)
RBC: 4.66 MIL/uL (ref 3.87–5.11)
RDW: 16.6 % — AB (ref 11.5–15.5)
WBC: 10.5 10*3/uL (ref 4.0–10.5)

## 2015-07-25 LAB — DIFFERENTIAL
BASOS ABS: 0 10*3/uL (ref 0.0–0.1)
BASOS PCT: 0 %
Eosinophils Absolute: 0.2 10*3/uL (ref 0.0–0.7)
Eosinophils Relative: 2 %
Lymphocytes Relative: 35 %
Lymphs Abs: 3.7 10*3/uL (ref 0.7–4.0)
MONOS PCT: 8 %
Monocytes Absolute: 0.8 10*3/uL (ref 0.1–1.0)
NEUTROS ABS: 5.7 10*3/uL (ref 1.7–7.7)
Neutrophils Relative %: 55 %

## 2015-07-25 LAB — RAPID URINE DRUG SCREEN, HOSP PERFORMED
AMPHETAMINES: NOT DETECTED
BARBITURATES: NOT DETECTED
BENZODIAZEPINES: NOT DETECTED
Cocaine: NOT DETECTED
Opiates: NOT DETECTED
Tetrahydrocannabinol: NOT DETECTED

## 2015-07-25 LAB — COMPREHENSIVE METABOLIC PANEL
ALBUMIN: 3.8 g/dL (ref 3.5–5.0)
ALT: 16 U/L (ref 14–54)
AST: 19 U/L (ref 15–41)
Alkaline Phosphatase: 90 U/L (ref 38–126)
Anion gap: 7 (ref 5–15)
BUN: 23 mg/dL — AB (ref 6–20)
CHLORIDE: 109 mmol/L (ref 101–111)
CO2: 25 mmol/L (ref 22–32)
CREATININE: 0.81 mg/dL (ref 0.44–1.00)
Calcium: 9.4 mg/dL (ref 8.9–10.3)
GFR calc Af Amer: >60 mL/min (ref >60)
GFR calc non Af Amer: >60 mL/min (ref >60)
GLUCOSE: 122 mg/dL — AB (ref 65–99)
Potassium: 4.4 mmol/L (ref 3.5–5.1)
SODIUM: 141 mmol/L (ref 135–145)
Total Bilirubin: 0.7 mg/dL (ref 0.3–1.2)
Total Protein: 7.2 g/dL (ref 6.5–8.1)

## 2015-07-25 LAB — I-STAT TROPONIN, ED: Troponin i, poc: 0 ng/mL (ref 0.00–0.08)

## 2015-07-25 LAB — PROTIME-INR
INR: 0.96 (ref 0.00–1.49)
Prothrombin Time: 13 seconds (ref 11.6–15.2)

## 2015-07-25 LAB — URINE MICROSCOPIC-ADD ON
Bacteria, UA: NONE SEEN
RBC / HPF: NONE SEEN RBC/hpf (ref 0–5)

## 2015-07-25 LAB — APTT: APTT: 25 s (ref 24–37)

## 2015-07-25 LAB — ETHANOL

## 2015-07-25 MED ORDER — KETOROLAC TROMETHAMINE 30 MG/ML IJ SOLN
30.0000 mg | Freq: Once | INTRAMUSCULAR | Status: AC
  Administered 2015-07-25: 30 mg via INTRAVENOUS
  Filled 2015-07-25: qty 1

## 2015-07-25 MED ORDER — ACETAMINOPHEN 500 MG PO TABS
1000.0000 mg | ORAL_TABLET | Freq: Once | ORAL | Status: AC
  Administered 2015-07-25: 1000 mg via ORAL
  Filled 2015-07-25: qty 2

## 2015-07-25 NOTE — ED Notes (Signed)
About 2030 tonight family noticed her head being wobbly, she stumbled around and fell into a car, and had slurred speech, pt states that she doesn't remember any of this

## 2015-07-25 NOTE — ED Provider Notes (Signed)
CSN: [id number]     Arrival date & time 07/25/15  0039 History  By signing my name below, I, Reola Mosher, attest that this documentation has been prepared under the direction and in the presence of Sharlett Iles, MD. Electronically Signed: Reola Mosher, ED Scribe. 07/25/2015. 1:52 AM.   Chief Complaint  Patient presents with  . Altered Mental Status   The history is provided by the patient. No language interpreter was used.    HPI Comments: Melanie Meyers is a 60 y.o. female with PMHx DM, HTN, HLD, GERD, and Sarcoidosis who presents to the Emergency Department complaining of sudden onset, resolved episode of altered mental status approximately 6 hours prior to being seen in the ED. Her family was present at the onset of her symptoms, and reports that they were driving down a road when the patient began to bob her head back and forth, was notably not responding to their questions although awake. They note that they then stopped on the road, and at the time the pt got out of the vehicle without difficulty. Her daughter states that after she was out of the car the pt then fell backwards onto the road, but they deny any head injury or LOC. Her family took her to be seen at an OSH for her symptoms prior to coming into the ED; however they left before being evaluated by a physician secondary to time and having waited too long. The pt notes that she remembers the drive down the street but only until they came to a stoplight, but does not remember anything during the episode or experiencing her symptoms. She states that she started remembering events again when she was checking into OSH approximately 1 hour after the onset of her symptoms. Pt notes that she has had a mild HA, with associated photophobia, all day prior to the episode, and was mildly nauseous while in the car just prior to her episode. She additionally states that she had felt mildly numb in her left arm. No recent  head injury, but she states that she has been falling more recently. No recent illness. Denies blurry vision, SOB, or any other symptoms. No CP/SOB.  Past Medical History  Diagnosis Date  . Diabetes mellitus without complication ([place])   . Hypertension   . Thyroid disease   . High cholesterol   . Sarcoidosis ([place])   . GERD (gastroesophageal reflux disease)    History reviewed. No pertinent past surgical history. History reviewed. No pertinent family history. Social History  Substance Use Topics  . Smoking status: Never Smoker   . Smokeless tobacco: Never Used  . Alcohol Use: No   OB History    No data available     Review of Systems A complete 10 system review of systems was obtained and all systems are negative except as noted in the HPI and PMH.   Allergies  Accupril; Neurontin; Prednisone; Topamax; and Levaquin  Home Medications   Prior to Admission medications   Medication Sig Start Date End Date Taking? Authorizing Provider  aspirin EC 81 MG tablet Take 81 mg by mouth.   Yes Historical Provider, MD  azelastine (ASTELIN) 0.1 % nasal spray Place 2 sprays into both nostrils 2 (two) times daily. Use in each nostril as directed   Yes Historical Provider, MD  BIOTIN PO Take 5,000 mcg by mouth daily.   Yes Historical Provider, MD  cetirizine (ZYRTEC) 10 MG tablet Take 10 mg by mouth daily.  Yes Historical Provider, MD  esomeprazole (NEXIUM) 40 MG capsule Take 40 mg by mouth daily at 12 noon.   Yes Historical Provider, MD  fluticasone furoate-vilanterol (BREO ELLIPTA) 200-25 MCG/INH AEPB Inhale 1 puff into the lungs daily.   Yes Historical Provider, MD  LANTUS SOLOSTAR 100 UNIT/ML Solostar Pen Inject 20 Units into the skin daily at 10 pm.  03/13/14  Yes Historical Provider, MD  levothyroxine (SYNTHROID, LEVOTHROID) 25 MCG tablet Take 25 mcg by mouth daily before breakfast.   Yes Historical Provider, MD  losartan (COZAAR) 25 MG tablet Take 25 mg by mouth daily.  05/14/14  Yes  Historical Provider, MD  lubiprostone (AMITIZA) 8 MCG capsule Take 8 mcg by mouth daily with breakfast.   Yes Historical Provider, MD  metFORMIN (GLUCOPHAGE) 1000 MG tablet Take 1,000 mg by mouth daily with breakfast.  04/09/14  Yes Historical Provider, MD  metoprolol tartrate (LOPRESSOR) 25 MG tablet Take 25 mg by mouth 2 (two) times daily.   Yes Historical Provider, MD  mometasone (NASONEX) 50 MCG/ACT nasal spray Place 2 sprays into the nose daily.  04/30/14  Yes Historical Provider, MD  montelukast (SINGULAIR) 10 MG tablet Take 10 mg by mouth at bedtime.  10/24/14  Yes Historical Provider, MD  naproxen (NAPROSYN) 500 MG tablet Take 500 mg by mouth 2 (two) times daily with a meal.  04/05/14  Yes Historical Provider, MD  Olopatadine HCl (PATADAY) 0.2 % SOLN Apply 1 drop to eye as needed (dry eyes).   Yes Historical Provider, MD  Omega-3 Fatty Acids (FISH OIL) 1200 MG CAPS Take 1 capsule by mouth daily.    Yes Historical Provider, MD  PARoxetine (PAXIL) 20 MG tablet Take 20 mg by mouth daily.   Yes Historical Provider, MD  polyethylene glycol (MIRALAX / GLYCOLAX) packet Take 17 g by mouth daily.   Yes Historical Provider, MD  pravastatin (PRAVACHOL) 10 MG tablet Take 10 mg by mouth daily.    Yes Historical Provider, MD  pregabalin (LYRICA) 75 MG capsule Two (2) times a day. 05/14/14  Yes Historical Provider, MD  silver sulfADIAZINE (SILVADENE) 1 % cream Apply 1 application topically 2 (two) times daily.   Yes Historical Provider, MD  SUMAtriptan (IMITREX) 100 MG tablet Take 100 mg by mouth every 2 (two) hours as needed for migraine. May repeat in 2 hours if headache persists or recurs.   Yes Historical Provider, MD  VENTOLIN HFA 108 (90 BASE) MCG/ACT inhaler Inhale 1-2 puffs into the lungs every 4 (four) hours as needed for shortness of breath.  03/26/14  Yes Historical Provider, MD  zolpidem (AMBIEN) 10 MG tablet Take 10 mg by mouth at bedtime as needed for sleep.   Yes Historical Provider, MD  ACCU-CHEK  AVIVA PLUS test strip  04/30/14   Historical Provider, MD  cephALEXin (KEFLEX) 500 MG capsule Take 1 capsule (500 mg total) by mouth 2 (two) times daily. Patient not taking: Reported on 07/25/2015 02/11/15   Gardiner Barefoot, DPM  fluconazole (DIFLUCAN) 150 MG tablet Take 1 tablet (150 mg total) by mouth once. Patient not taking: Reported on 07/25/2015 02/22/15   Gardiner Barefoot, DPM  UNIFINE PENTIPS 31G X 6 MM MISC  03/22/14   Historical Provider, MD   BP 127/75 mmHg  Pulse 59  Temp(Src) 98.5 F (36.9 C) (Oral)  Resp 16  SpO2 100%  LMP  (LMP Unknown)   Physical Exam  Constitutional: She is oriented to person, place, and time. She appears well-developed and well-nourished. No distress.  Awake, alert  HENT:  Head: Normocephalic and atraumatic.  Eyes: Conjunctivae and EOM are normal. Pupils are equal, round, and reactive to light.  Neck: Neck supple.  Cardiovascular: Normal rate, regular rhythm and normal heart sounds.   No murmur heard. Pulmonary/Chest: Effort normal and breath sounds normal. No respiratory distress.  Abdominal: Soft. Bowel sounds are normal. She exhibits no distension.  Musculoskeletal: She exhibits no edema.  Neurological: She is alert and oriented to person, place, and time. She has normal reflexes. No cranial nerve deficit. She exhibits normal muscle tone.  Fluent speech, normal finger-to-nose testing, negative pronator drift, no clonus 5/5 strength and normal sensation x all 4 extremities  Skin: Skin is warm and dry.  Psychiatric: She has a normal mood and affect. Judgment and thought content normal.  Nursing note and vitals reviewed.  ED Course  Procedures   DIAGNOSTIC STUDIES: Oxygen Saturation is 98% on RA, normal by my interpretation.   COORDINATION OF CARE: 1:47 AM-Discussed next steps with pt. Pt verbalized understanding and is agreeable with the plan.   Labs Review Labs Reviewed  URINALYSIS, ROUTINE W REFLEX MICROSCOPIC (NOT AT [facility]) - Abnormal; Notable  for the following:    Leukocytes, UA SMALL (*)    All other components within normal limits  CBC - Abnormal; Notable for the following:    RDW 16.6 (*)    All other components within normal limits  COMPREHENSIVE METABOLIC PANEL - Abnormal; Notable for the following:    Glucose, Bld 122 (*)    BUN 23 (*)    All other components within normal limits  URINE MICROSCOPIC-ADD ON - Abnormal; Notable for the following:    Squamous Epithelial / LPF 6-30 (*)    All other components within normal limits  ETHANOL  PROTIME-INR  APTT  DIFFERENTIAL  URINE RAPID DRUG SCREEN, HOSP PERFORMED  I-STAT TROPOININ, ED    Imaging Review Ct Head Code Stroke W/o Cm  07/25/2015  CLINICAL DATA:  RIGHT-sided weakness, slurred speech beginning 6 hours ago, improved now. History of sarcoidosis, hypertension and diabetes. EXAM: CT HEAD WITHOUT CONTRAST TECHNIQUE: Contiguous axial images were obtained from the base of the skull through the vertex without intravenous contrast. COMPARISON:  Paranasal sinus CT September 07, 2014 FINDINGS: INTRACRANIAL CONTENTS: The ventricles and sulci are normal. No intraparenchymal hemorrhage, mass effect nor midline shift. No acute large vascular territory infarcts. No abnormal extra-axial fluid collections. Stable punctate calcifications bilateral basal ganglia. Basal cisterns are patent. No hyperdense MCA or insular ribbon sign. ORBITS: The included ocular globes and orbital contents are normal. SINUSES: The mastoid aircells and included paranasal sinuses are well-aerated. SKULL/SOFT TISSUES: No skull fracture. No significant soft tissue swelling. Fatty replaced parotid glands. IMPRESSION: Negative CT HEAD. Acute findings discussed with and reconfirmed by Dr.Sanjeev Main on 07/25/2015 at 2:00 am. Electronically Signed   By: Elon Alas M.D.   On: 07/25/2015 02:02    I have personally reviewed and evaluated these lab results as part of my medical decision-making.   EKG  Interpretation   Date/Time:  Thursday July 25 2015 01:23:19 EDT Ventricular Rate:  61 PR Interval:    QRS Duration: 103 QT Interval:  421 QTC Calculation: 424 R Axis:   47 Text Interpretation:  Sinus rhythm Low voltage, precordial leads No  previous ECGs available Confirmed by Melesa Lecy MD, Aleksandar Duve [id number]) on  07/25/2015 1:57:52 AM Also confirmed by Paisyn Guercio MD, Kento Gossman [phone]), editor  [place]  [facility], [place] ([id number])  on 07/25/2015 7:02:07 AM  Medications  acetaminophen (TYLENOL) tablet 1,000 mg (1,000 mg Oral Given 07/25/15 0251)  ketorolac (TORADOL) 30 MG/ML injection 30 mg (30 mg Intravenous Given 07/25/15 0748)     MDM   Final diagnoses:  Episode of unresponsiveness   Patient presents several hours after a self-limited episode of unresponsiveness while awake witnessed by family, who state that she began bobbing her head and later when she got out of the car fell backwards onto her bottom. She was taken to an outside hospital and then left AMA prior to arrival here. On exam, she was awake and alert, comfortable and in no acute distress. Vital signs stable. Normal neurologic exam. She endorses a mild headache but denies any other complaints. EKG without any acute ischemic changes. Obtained above lab work which showed normal troponin, unremarkable CBC and CMP. Obtained CT of head which showed no acute findings. Gave the patient Tylenol for headache and later Toradol.  I discussed the patient's symptoms and presentation with neurology, Dr. Shon Hale, who felt that the episode was extremely unlikely to represent a TIA and therefore recommended outpatient follow up without need for admission for further studies. The patient has remained stable with no neurologic symptoms for several hours in the ED. No chest pain or other symptoms to suggest cardiac etiology. I discussed the importance of follow-up with PCP regarding her symptoms and consideration of an outpatient MRI. Extensively reviewed return  precautions including any recurrence of symptoms or new neurologic symptoms. The patient and her family voiced understanding and patient was discharged in satisfactory condition.  I personally performed the services described in this documentation, which was scribed in my presence. The recorded information has been reviewed and is accurate.    Sharlett Iles, MD 07/25/15 [phone]

## 2015-07-25 NOTE — Discharge Instructions (Signed)
YOU WERE SEEN TODAY FOR AN EPISODE OF UNRESPONSIVENESS. YOUR HEAD CT AND LABWORK WERE NORMAL. THE NEUROLOGIST RECOMMENDED FOLLOW UP WITH YOUR PRIMARY CARE PROVIDER TO DISCUSS POSSIBLE NEED FOR BRAIN MRI IN THE CLINIC. PLEASE RETURN IMMEDIATELY IF YOU HAVE A REOCCURENCE OF YOUR SYMPTOMS, NEW SYMPTOMS, CHEST PAIN, OR SHORTNESS OF BREATH.

## 2015-07-25 NOTE — ED Notes (Signed)
EKG given to [facility]. MD., for review.

## 2015-07-25 NOTE — ED Notes (Signed)
EKG given to EDP,Little,MD., for review. 

## 2015-07-29 DIAGNOSIS — G4733 Obstructive sleep apnea (adult) (pediatric): Secondary | ICD-10-CM | POA: Diagnosis not present

## 2015-07-29 DIAGNOSIS — R918 Other nonspecific abnormal finding of lung field: Secondary | ICD-10-CM | POA: Diagnosis not present

## 2015-07-29 DIAGNOSIS — J301 Allergic rhinitis due to pollen: Secondary | ICD-10-CM | POA: Diagnosis not present

## 2015-07-29 DIAGNOSIS — D860 Sarcoidosis of lung: Secondary | ICD-10-CM | POA: Diagnosis not present

## 2015-07-31 DIAGNOSIS — F419 Anxiety disorder, unspecified: Secondary | ICD-10-CM | POA: Diagnosis not present

## 2015-07-31 DIAGNOSIS — Z6839 Body mass index (BMI) 39.0-39.9, adult: Secondary | ICD-10-CM | POA: Diagnosis not present

## 2015-07-31 DIAGNOSIS — R410 Disorientation, unspecified: Secondary | ICD-10-CM | POA: Diagnosis not present

## 2015-07-31 DIAGNOSIS — G4710 Hypersomnia, unspecified: Secondary | ICD-10-CM | POA: Diagnosis not present

## 2015-08-07 DIAGNOSIS — G4733 Obstructive sleep apnea (adult) (pediatric): Secondary | ICD-10-CM | POA: Diagnosis not present

## 2015-08-13 DIAGNOSIS — R5383 Other fatigue: Secondary | ICD-10-CM | POA: Diagnosis not present

## 2015-08-13 DIAGNOSIS — R918 Other nonspecific abnormal finding of lung field: Secondary | ICD-10-CM | POA: Diagnosis not present

## 2015-08-13 DIAGNOSIS — J301 Allergic rhinitis due to pollen: Secondary | ICD-10-CM | POA: Diagnosis not present

## 2015-08-13 DIAGNOSIS — G4733 Obstructive sleep apnea (adult) (pediatric): Secondary | ICD-10-CM | POA: Diagnosis not present

## 2015-08-13 DIAGNOSIS — R0600 Dyspnea, unspecified: Secondary | ICD-10-CM | POA: Diagnosis not present

## 2015-08-14 DIAGNOSIS — E1149 Type 2 diabetes mellitus with other diabetic neurological complication: Secondary | ICD-10-CM | POA: Diagnosis not present

## 2015-08-14 DIAGNOSIS — K5900 Constipation, unspecified: Secondary | ICD-10-CM | POA: Diagnosis not present

## 2015-08-14 DIAGNOSIS — E559 Vitamin D deficiency, unspecified: Secondary | ICD-10-CM | POA: Diagnosis not present

## 2015-08-14 DIAGNOSIS — E538 Deficiency of other specified B group vitamins: Secondary | ICD-10-CM | POA: Diagnosis not present

## 2015-08-14 DIAGNOSIS — Z6841 Body Mass Index (BMI) 40.0 and over, adult: Secondary | ICD-10-CM | POA: Diagnosis not present

## 2015-08-14 DIAGNOSIS — Z79899 Other long term (current) drug therapy: Secondary | ICD-10-CM | POA: Diagnosis not present

## 2015-08-14 DIAGNOSIS — E039 Hypothyroidism, unspecified: Secondary | ICD-10-CM | POA: Diagnosis not present

## 2015-08-14 DIAGNOSIS — F419 Anxiety disorder, unspecified: Secondary | ICD-10-CM | POA: Diagnosis not present

## 2015-09-12 DIAGNOSIS — E1149 Type 2 diabetes mellitus with other diabetic neurological complication: Secondary | ICD-10-CM | POA: Diagnosis not present

## 2015-09-12 DIAGNOSIS — Z6841 Body Mass Index (BMI) 40.0 and over, adult: Secondary | ICD-10-CM | POA: Diagnosis not present

## 2015-09-12 DIAGNOSIS — E559 Vitamin D deficiency, unspecified: Secondary | ICD-10-CM | POA: Diagnosis not present

## 2015-09-12 DIAGNOSIS — F419 Anxiety disorder, unspecified: Secondary | ICD-10-CM | POA: Diagnosis not present

## 2015-09-12 DIAGNOSIS — R413 Other amnesia: Secondary | ICD-10-CM | POA: Diagnosis not present

## 2015-09-12 DIAGNOSIS — E039 Hypothyroidism, unspecified: Secondary | ICD-10-CM | POA: Diagnosis not present

## 2015-09-12 DIAGNOSIS — Z1231 Encounter for screening mammogram for malignant neoplasm of breast: Secondary | ICD-10-CM | POA: Diagnosis not present

## 2015-09-12 DIAGNOSIS — I10 Essential (primary) hypertension: Secondary | ICD-10-CM | POA: Diagnosis not present

## 2015-09-13 DIAGNOSIS — Z9013 Acquired absence of bilateral breasts and nipples: Secondary | ICD-10-CM | POA: Diagnosis not present

## 2015-09-20 ENCOUNTER — Encounter (HOSPITAL_COMMUNITY): Payer: Self-pay | Admitting: *Deleted

## 2015-09-20 ENCOUNTER — Emergency Department (HOSPITAL_COMMUNITY)
Admission: EM | Admit: 2015-09-20 | Discharge: 2015-09-20 | Disposition: A | Discharge status: Home / Self Care | Payer: Commercial Managed Care - HMO | Attending: Emergency Medicine | Admitting: Emergency Medicine

## 2015-09-20 ENCOUNTER — Emergency Department (HOSPITAL_COMMUNITY): Payer: Commercial Managed Care - HMO

## 2015-09-20 DIAGNOSIS — R110 Nausea: Secondary | ICD-10-CM | POA: Diagnosis not present

## 2015-09-20 DIAGNOSIS — I10 Essential (primary) hypertension: Secondary | ICD-10-CM | POA: Insufficient documentation

## 2015-09-20 DIAGNOSIS — Z791 Long term (current) use of non-steroidal anti-inflammatories (NSAID): Secondary | ICD-10-CM | POA: Insufficient documentation

## 2015-09-20 DIAGNOSIS — R1084 Generalized abdominal pain: Secondary | ICD-10-CM | POA: Diagnosis not present

## 2015-09-20 DIAGNOSIS — K5900 Constipation, unspecified: Secondary | ICD-10-CM | POA: Insufficient documentation

## 2015-09-20 DIAGNOSIS — R109 Unspecified abdominal pain: Secondary | ICD-10-CM | POA: Diagnosis present

## 2015-09-20 DIAGNOSIS — E119 Type 2 diabetes mellitus without complications: Secondary | ICD-10-CM | POA: Diagnosis not present

## 2015-09-20 DIAGNOSIS — Z7984 Long term (current) use of oral hypoglycemic drugs: Secondary | ICD-10-CM | POA: Insufficient documentation

## 2015-09-20 DIAGNOSIS — Z79899 Other long term (current) drug therapy: Secondary | ICD-10-CM | POA: Diagnosis not present

## 2015-09-20 DIAGNOSIS — Z794 Long term (current) use of insulin: Secondary | ICD-10-CM | POA: Diagnosis not present

## 2015-09-20 DIAGNOSIS — N62 Hypertrophy of breast: Secondary | ICD-10-CM | POA: Diagnosis not present

## 2015-09-20 DIAGNOSIS — R0602 Shortness of breath: Secondary | ICD-10-CM | POA: Diagnosis not present

## 2015-09-20 DIAGNOSIS — Z7982 Long term (current) use of aspirin: Secondary | ICD-10-CM | POA: Insufficient documentation

## 2015-09-20 LAB — COMPREHENSIVE METABOLIC PANEL
ALT: 18 U/L (ref 14–54)
AST: 17 U/L (ref 15–41)
Albumin: 3.7 g/dL (ref 3.5–5.0)
Alkaline Phosphatase: 83 U/L (ref 38–126)
Anion gap: 7 (ref 5–15)
BUN: 24 mg/dL — AB (ref 6–20)
CHLORIDE: 105 mmol/L (ref 101–111)
CO2: 26 mmol/L (ref 22–32)
CREATININE: 0.86 mg/dL (ref 0.44–1.00)
Calcium: 9.3 mg/dL (ref 8.9–10.3)
GFR calc Af Amer: >60 mL/min (ref >60)
GFR calc non Af Amer: >60 mL/min (ref >60)
Glucose, Bld: 88 mg/dL (ref 65–99)
Potassium: 4.4 mmol/L (ref 3.5–5.1)
SODIUM: 138 mmol/L (ref 135–145)
Total Bilirubin: 0.5 mg/dL (ref 0.3–1.2)
Total Protein: 7.5 g/dL (ref 6.5–8.1)

## 2015-09-20 LAB — CBC WITH DIFFERENTIAL/PLATELET
Basophils Absolute: 0 10*3/uL (ref 0.0–0.1)
Basophils Relative: 0 %
EOS ABS: 0.2 10*3/uL (ref 0.0–0.7)
Eosinophils Relative: 2 %
HEMATOCRIT: 37.9 % (ref 36.0–46.0)
HEMOGLOBIN: 12.1 g/dL (ref 12.0–15.0)
LYMPHS ABS: 3 10*3/uL (ref 0.7–4.0)
LYMPHS PCT: 30 %
MCH: 26.2 pg (ref 26.0–34.0)
MCHC: 31.9 g/dL (ref 30.0–36.0)
MCV: 82 fL (ref 78.0–100.0)
MONOS PCT: 7 %
Monocytes Absolute: 0.7 10*3/uL (ref 0.1–1.0)
NEUTROS PCT: 61 %
Neutro Abs: 6 10*3/uL (ref 1.7–7.7)
PLATELETS: 266 10*3/uL (ref 150–400)
RBC: 4.62 MIL/uL (ref 3.87–5.11)
RDW: 15.3 % (ref 11.5–15.5)
WBC: 10 10*3/uL (ref 4.0–10.5)

## 2015-09-20 LAB — URINALYSIS, ROUTINE W REFLEX MICROSCOPIC
BILIRUBIN URINE: NEGATIVE
Glucose, UA: NEGATIVE mg/dL
Hgb urine dipstick: NEGATIVE
Ketones, ur: NEGATIVE mg/dL
NITRITE: NEGATIVE
PH: 5.5 (ref 5.0–8.0)
Protein, ur: NEGATIVE mg/dL
SPECIFIC GRAVITY, URINE: 1.025 (ref 1.005–1.030)

## 2015-09-20 LAB — URINE MICROSCOPIC-ADD ON

## 2015-09-20 LAB — LIPASE, BLOOD: LIPASE: 40 U/L (ref 11–51)

## 2015-09-20 MED ORDER — ONDANSETRON HCL 4 MG/2ML IJ SOLN
4.0000 mg | Freq: Once | INTRAMUSCULAR | Status: AC
  Administered 2015-09-20: 4 mg via INTRAVENOUS
  Filled 2015-09-20: qty 2

## 2015-09-20 MED ORDER — POLYETHYLENE GLYCOL 3350 17 GM/SCOOP PO POWD: 1.0000 | Freq: Once | ORAL | 0 refills | Status: AC

## 2015-09-20 NOTE — ED Notes (Signed)
Patient transported to X-ray 

## 2015-09-20 NOTE — ED Triage Notes (Signed)
Pt states she has had increased  abd pain, bloating and constipation over the last week with nausea and cramping.

## 2015-09-20 NOTE — ED Provider Notes (Signed)
[facility] DEPT Provider Note   CSN: [id number] Arrival date & time: 09/20/15  1015     History   Chief Complaint Chief Complaint  Patient presents with  . Abdominal Pain    HPI Melanie Meyers is a 60 y.o. female who presents with abdominal discomfort. Past medical history significant for diabetes, GERD, hyperlipidemia, hypertension, sarcoidosis, thyroid disease. She states that over the past week she has had worsening abdominal distention and discomfort. She reports associated nausea and having to eat smaller meals. She states she has the feeling of tenesmus but is unable to produce a bowel movement. Last bowel movement was yesterday. She reports that she's had some shortness of breath to the feeling of her abdomen pressing on her lungs. Denies fever, chills, chest pain, cough, flank pain, vomiting, diarrhea, constipation, irritative voiding symptoms, vaginal discharge, vaginal bleeding. She states she has had 3 C-sections and tubal ligation. She has had an endoscopy which was unremarkable 2 years ago and a colonoscopy 3 years ago was remarkable for hemorrhoids. She takes Miralax daily as well as a stool softener.  HPI  Past Medical History:  Diagnosis Date  . Diabetes mellitus without complication ([place])   . GERD (gastroesophageal reflux disease)   . High cholesterol   . Hypertension   . Sarcoidosis ([place])   . Thyroid disease     Patient Active Problem List   Diagnosis Date Noted  . BP (high blood pressure) 06/20/2015  . HLD (hyperlipidemia) 06/20/2015  . Morbid obesity ([place]) 06/20/2015  . Metatarsal deformity 05/30/2014  . Metatarsalgia 05/30/2014  . Neuropathy associated with endocrine disorder ([place]) 05/30/2014  . Hand paresthesia 06/27/2013  . Adult hypothyroidism 06/02/2013  . Type 2 diabetes mellitus ([place]) 06/02/2013  . 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 Nerve root pain 01/27/2013  . Narrowing of intervertebral disc  space 01/27/2013  . Myofascial pain 01/27/2013  . Cervical pain 11/24/2012  . Cervical osteoarthritis 11/24/2012  . Carpal tunnel syndrome 01/15/2012  . Airway hyperreactivity 01/20/2008    History reviewed. No pertinent surgical history.  OB History    No data available       Home Medications    Prior to Admission medications   Medication Sig Start Date End Date Taking? Authorizing Provider  ACCU-CHEK AVIVA PLUS test strip  04/30/14   Historical Provider, MD  aspirin EC 81 MG tablet Take 81 mg by mouth.    Historical Provider, MD  azelastine (ASTELIN) 0.1 % nasal spray Place 2 sprays into both nostrils 2 (two) times daily. Use in each nostril as directed    Historical Provider, MD  BIOTIN PO Take 5,000 mcg by mouth daily.    Historical Provider, MD  cephALEXin (KEFLEX) 500 MG capsule Take 1 capsule (500 mg total) by mouth 2 (two) times daily. Patient not taking: Reported on 07/25/2015 02/11/15   Gardiner Barefoot, DPM  cetirizine (ZYRTEC) 10 MG tablet Take 10 mg by mouth daily.    Historical Provider, MD  esomeprazole (NEXIUM) 40 MG capsule Take 40 mg by mouth daily at 12 noon.    Historical Provider, MD  fluconazole (DIFLUCAN) 150 MG tablet Take 1 tablet (150 mg total) by mouth once. Patient not taking: Reported on 07/25/2015 02/22/15   Gardiner Barefoot, DPM  fluticasone furoate-vilanterol (BREO ELLIPTA) 200-25 MCG/INH AEPB Inhale 1 puff into the lungs daily.    Historical Provider, MD  LANTUS SOLOSTAR 100 UNIT/ML Solostar Pen Inject 20 Units into the skin daily at 10  pm.  03/13/14   Historical Provider, MD  levothyroxine (SYNTHROID, LEVOTHROID) 25 MCG tablet Take 25 mcg by mouth daily before breakfast.    Historical Provider, MD  losartan (COZAAR) 25 MG tablet Take 25 mg by mouth daily.  05/14/14   Historical Provider, MD  lubiprostone (AMITIZA) 8 MCG capsule Take 8 mcg by mouth daily with breakfast.    Historical Provider, MD  metFORMIN (GLUCOPHAGE) 1000 MG tablet Take 1,000 mg by mouth daily  with breakfast.  04/09/14   Historical Provider, MD  metoprolol tartrate (LOPRESSOR) 25 MG tablet Take 25 mg by mouth 2 (two) times daily.    Historical Provider, MD  mometasone (NASONEX) 50 MCG/ACT nasal spray Place 2 sprays into the nose daily.  04/30/14   Historical Provider, MD  montelukast (SINGULAIR) 10 MG tablet Take 10 mg by mouth at bedtime.  10/24/14   Historical Provider, MD  naproxen (NAPROSYN) 500 MG tablet Take 500 mg by mouth 2 (two) times daily with a meal.  04/05/14   Historical Provider, MD  Olopatadine HCl (PATADAY) 0.2 % SOLN Apply 1 drop to eye as needed (dry eyes).    Historical Provider, MD  Omega-3 Fatty Acids (FISH OIL) 1200 MG CAPS Take 1 capsule by mouth daily.     Historical Provider, MD  PARoxetine (PAXIL) 20 MG tablet Take 20 mg by mouth daily.    Historical Provider, MD  polyethylene glycol (MIRALAX / GLYCOLAX) packet Take 17 g by mouth daily.    Historical Provider, MD  pravastatin (PRAVACHOL) 10 MG tablet Take 10 mg by mouth daily.     Historical Provider, MD  pregabalin (LYRICA) 75 MG capsule Two (2) times a day. 05/14/14   Historical Provider, MD  silver sulfADIAZINE (SILVADENE) 1 % cream Apply 1 application topically 2 (two) times daily.    Historical Provider, MD  SUMAtriptan (IMITREX) 100 MG tablet Take 100 mg by mouth every 2 (two) hours as needed for migraine. May repeat in 2 hours if headache persists or recurs.    Historical Provider, MD  UNIFINE PENTIPS 31G X 6 MM [place]  03/22/14   Historical Provider, MD  VENTOLIN HFA 108 (90 BASE) MCG/ACT inhaler Inhale 1-2 puffs into the lungs every 4 (four) hours as needed for shortness of breath.  03/26/14   Historical Provider, MD  zolpidem (AMBIEN) 10 MG tablet Take 10 mg by mouth at bedtime as needed for sleep.    Historical Provider, MD    Family History No family history on file.  Social History Social History  Substance Use Topics  . Smoking status: Never Smoker  . Smokeless tobacco: Never Used  . Alcohol use No       Allergies   Accupril [quinapril hcl]; Neurontin [gabapentin]; Prednisone; Topamax [topiramate]; and Levaquin [levofloxacin]   Review of Systems Review of Systems  Constitutional: Negative for chills and fever.  Respiratory: Positive for shortness of breath. Negative for cough.   Cardiovascular: Negative for chest pain.  Gastrointestinal: Positive for abdominal distention, constipation and nausea. Negative for abdominal pain, diarrhea and vomiting.  Genitourinary: Negative for dysuria, vaginal bleeding and vaginal discharge.     Physical Exam Updated Vital Signs BP 158/74 (BP Location: Left Arm)   Pulse (!) 55   Resp 16   Ht 5' (1.524 m)   Wt 97.5 kg   LMP  (LMP Unknown)   SpO2 99%   BMI 41.99 kg/m   Physical Exam  Constitutional: She is oriented to person, place, and time. She appears  well-developed and well-nourished. No distress.  Pleasant, obese female in NAD  HENT:  Head: Normocephalic and atraumatic.  Eyes: Conjunctivae are normal. Pupils are equal, round, and reactive to light. Right eye exhibits no discharge. Left eye exhibits no discharge. No scleral icterus.  Neck: Normal range of motion. Neck supple.  Cardiovascular: Normal rate and regular rhythm.  Exam reveals no gallop and no friction rub.   No murmur heard. Pulmonary/Chest: Effort normal. No respiratory distress. She has decreased breath sounds in the left upper field, the left middle field and the left lower field. She has no wheezes. She has no rales. She exhibits no tenderness.  Abdominal: Soft. She exhibits no distension and no mass. There is tenderness. There is no rebound and no guarding. No hernia.  Mild generalized tenderness  Musculoskeletal: She exhibits no edema.  Neurological: She is alert and oriented to person, place, and time.  Skin: Skin is warm and dry.  Psychiatric: She has a normal mood and affect. Her behavior is normal.  Nursing note and vitals reviewed.   ED Treatments / Results   Labs (all labs ordered are listed, but only abnormal results are displayed) Labs Reviewed  COMPREHENSIVE METABOLIC PANEL - Abnormal; Notable for the following:       Result Value   BUN 24 (*)    All other components within normal limits  URINALYSIS, ROUTINE W REFLEX MICROSCOPIC (NOT AT [facility]) - Abnormal; Notable for the following:    Leukocytes, UA SMALL (*)    All other components within normal limits  URINE MICROSCOPIC-ADD ON - Abnormal; Notable for the following:    Squamous Epithelial / LPF 0-5 (*)    Bacteria, UA RARE (*)    All other components within normal limits  LIPASE, BLOOD  CBC WITH DIFFERENTIAL/PLATELET    EKG  EKG Interpretation None       Radiology Dg Abd Acute W/chest  Result Date: 09/20/2015 CLINICAL DATA:  Worsening generalized abdominal pain. EXAM: DG ABDOMEN ACUTE W/ 1V CHEST COMPARISON:  Chest CT 06/19/2015 FINDINGS: Normal mediastinum and cardiac silhouette. Normal pulmonary vasculature. No evidence of effusion, infiltrate, or pneumothorax. No acute bony abnormality. Bilateral hilar adenopathy. 7 mm nodule projects over the LEFT upper lobe and corresponds to nodule on comparison CT. Additional RIGHT lung nodules noted. No dilated loops of large or small bowel. Gas and stool in the rectum. No pathologic calcifications. No organomegaly. No acute osseous abnormality. Moderate volume stool throughout colon. IMPRESSION: 1. No cardiopulmonary process. 2. Hilar adenopathy and pulmonary nodules relate to history of sarcoidosis and correspond to prior CT findings. 3. No evidence of bowel obstruction or free air. 4. Moderate volume stool may represent constipation. Electronically Signed   By: Suzy Bouchard M.D.   On: 09/20/2015 11:36    Procedures Procedures (including critical care time)  Medications Ordered in ED Medications  ondansetron (ZOFRAN) injection 4 mg (not administered)     Initial Impression / Assessment and Plan / ED Course  I have reviewed the  triage vital signs and the nursing notes.  Pertinent labs & imaging results that were available during my care of the patient were reviewed by me and considered in my medical decision making (see chart for details).  Clinical Course   60 year old female presents with abdominal discomfort most likely due to consultation. Patient is not tachycardic or tachypneic, normotensive, and not hypoxic. Labs are overall unremarkable. Urine is clean. Acute chest and abdomen reveal moderate stool burden and lung nodules due to sarcoidosis. Advised  to increase her MiraLAX and fluids until she has a daily BM. PCP follow up recommended. Shared visit with Dr. Leonette Monarch. Patient is NAD, non-toxic, with stable VS. Patient is informed of clinical course, understands medical decision making process, and agrees with plan. Opportunity for questions provided and all questions answered. Return precautions given.   Final Clinical Impressions(s) / ED Diagnoses   Final diagnoses:  Constipation, unspecified constipation type    New Prescriptions New Prescriptions   No medications on file     Recardo Evangelist, PA-C 09/20/15 [place], MD 09/20/15 1640

## 2015-09-23 DIAGNOSIS — R109 Unspecified abdominal pain: Secondary | ICD-10-CM | POA: Diagnosis not present

## 2015-09-23 DIAGNOSIS — K5909 Other constipation: Secondary | ICD-10-CM | POA: Diagnosis not present

## 2015-09-23 DIAGNOSIS — M25561 Pain in right knee: Secondary | ICD-10-CM | POA: Diagnosis not present

## 2015-09-23 DIAGNOSIS — Z79899 Other long term (current) drug therapy: Secondary | ICD-10-CM | POA: Diagnosis not present

## 2015-09-23 DIAGNOSIS — Z6841 Body Mass Index (BMI) 40.0 and over, adult: Secondary | ICD-10-CM | POA: Diagnosis not present

## 2015-09-26 DIAGNOSIS — Z6841 Body Mass Index (BMI) 40.0 and over, adult: Secondary | ICD-10-CM | POA: Diagnosis not present

## 2015-09-26 DIAGNOSIS — M549 Dorsalgia, unspecified: Secondary | ICD-10-CM | POA: Diagnosis not present

## 2015-09-26 DIAGNOSIS — F419 Anxiety disorder, unspecified: Secondary | ICD-10-CM | POA: Diagnosis not present

## 2015-09-26 DIAGNOSIS — Z1231 Encounter for screening mammogram for malignant neoplasm of breast: Secondary | ICD-10-CM | POA: Diagnosis not present

## 2015-09-26 DIAGNOSIS — E1149 Type 2 diabetes mellitus with other diabetic neurological complication: Secondary | ICD-10-CM | POA: Diagnosis not present

## 2015-09-26 DIAGNOSIS — K5900 Constipation, unspecified: Secondary | ICD-10-CM | POA: Diagnosis not present

## 2015-10-08 DIAGNOSIS — L02611 Cutaneous abscess of right foot: Secondary | ICD-10-CM | POA: Diagnosis not present

## 2015-10-09 ENCOUNTER — Ambulatory Visit (INDEPENDENT_AMBULATORY_CARE_PROVIDER_SITE_OTHER): Payer: Commercial Managed Care - HMO | Admitting: Neurology

## 2015-10-09 ENCOUNTER — Encounter: Payer: Self-pay | Admitting: Neurology

## 2015-10-09 VITALS — BP 132/76 | HR 64 | Temp 98.2°F | Ht 60.0 in | Wt 216.1 lb

## 2015-10-09 DIAGNOSIS — R413 Other amnesia: Secondary | ICD-10-CM

## 2015-10-09 DIAGNOSIS — R404 Transient alteration of awareness: Secondary | ICD-10-CM | POA: Diagnosis not present

## 2015-10-09 DIAGNOSIS — G629 Polyneuropathy, unspecified: Secondary | ICD-10-CM

## 2015-10-09 MED ORDER — PREGABALIN 150 MG PO CAPS: 150.0000 mg | ORAL_CAPSULE | Freq: Two times a day (BID) | ORAL | 5 refills | Status: DC

## 2015-10-09 NOTE — Progress Notes (Signed)
NEUROLOGY CONSULTATION NOTE  Melanie Meyers MRN: [id number] DOB: [birth year]  Referring provider: Janine Limbo, PA-C Primary care provider: Dr. Nicholos Johns  Reason for consult:  Transient episode of unresponsiveness  Dear Dr Rica Records:  Thank you for your kind referral of Melanie Meyers for consultation of the above symptoms. Although her history is well known to you, please allow me to reiterate it for the purpose of our medical record. The patient was accompanied to the clinic by her daughter who also provides collateral information. Records and images were personally reviewed where available.  HISTORY OF PRESENT ILLNESS: This is a 60 year old right-handed woman with a history of hypertension, diabetes, sleep apnea on CPAP, sarcoidosis, B12 deficiency, hypothyroidism, anxiety, migraines, presenting for evaluation of a transient episode of loss of awareness last 07/24/2015. She recalls sitting in the car with her daughter then suddenly feeling sleepy. She recalls closing her eyes, then waking up in the ER. She then went to [facility] ER, records reviewed. Her other daughter witnessed her head bobbing back and forth, she was not responding to questions although she was reported to be awake. She was told her face was droopy and her speech was slurred when she asked her daughter why she was being taken to the ER (she does not recall this). They stopped on the road and she got out of the car, then fell backwards, no injuries. They went to a different ER but left due to a long wait, then went to [facility] where she was back to baseline. The episode lasted 45 minutes. She reported a mild headache with photophobia all day prior to the event, and was mildly nauseated in the car. She reported feeling mildly numb on her left arm, but today denies any focal symptoms. Bloodwork showed unremarkable CBC, CMP, negative ethanol level. I personally reviewed head CT without contrast which did not show any acute  changes. She denies any further episodes since then, no history of similar episodes in the past.  She and her daughter deny any other staring/unresponsive episodes or gaps in time, no olfactory/gustatory hallucinations, deja vu, rising epigastric sensation, focal numbness/tingling/weakness, myoclonic jerks. She has a history of migraines that used to respond to Relpax, however over the past 3-4 months, she has been having a constant throbbing headache over the vertex and occipital regions, waxing and waning in intensity, with associated photosensitivity, no nausea/vomiting. Imitrex does not help as much as the Relpax, however insurance will not cover Relpax. She has occasional dizziness described as lightheadedness and occasional spinning, lasting a few seconds, which can occur with any position. She has chronic neck and back pain. She has a history of neuropathy with numbness and tingling in both arms and legs, she states the neuropathy in her arms are due to B12 deficiency, and due to diabetic neuropathy in her legs. Her last HbA1c was 6.1. She is taking Lyrica 75mg  BID. On higher dose (150mg  BID), she was told by her son she had psychiatric problems, which hurt her. She does not see Psychiatry. She reports unrefreshing sleep despite use of CPAP machine. She has been forgetful for the past few months, she forgets words. She drives and denies getting lost driving. She lives with her daughter and denies any missed bill payments or missed medications. Her mother and maternal aunt had dementia. She reports starting Paxil a week before the episode, this was switched to Abilify 2 weeks ago.   She had a normal birth and early development.  There  is no history of febrile convulsions, CNS infections such as meningitis/encephalitis, significant traumatic brain injury, neurosurgical procedures, or family history of seizures.    PAST MEDICAL HISTORY: Past Medical History:  Diagnosis Date  . Diabetes mellitus without  complication ([place])   . GERD (gastroesophageal reflux disease)   . High cholesterol   . Hypertension   . Sarcoidosis ([place])   . Thyroid disease     PAST SURGICAL HISTORY: No past surgical history on file.  MEDICATIONS: Current Outpatient Prescriptions on File Prior to Visit  Medication Sig Dispense Refill  . ACCU-CHEK AVIVA PLUS test strip 1 each by Other route as needed (for BS).     . Albuterol Sulfate (PROAIR RESPICLICK) 123XX123 (90 Base) MCG/ACT AEPB Inhale 1-2 puffs into the lungs every 4 (four) hours as needed (for shortness of breath).    . ARIPiprazole (ABILIFY) 10 MG tablet Take 10 mg by mouth daily with breakfast.    . aspirin EC 81 MG tablet Take 81 mg by mouth at bedtime.     Marland Kitchen azelastine (ASTELIN) 0.1 % nasal spray Place 2 sprays into both nostrils 2 (two) times daily. Use in each nostril as directed    . BIOTIN PO Take 5,000 mcg by mouth at bedtime.     . cetirizine (ZYRTEC) 10 MG tablet Take 10 mg by mouth at bedtime.     Marland Kitchen esomeprazole (NEXIUM) 40 MG capsule Take 40 mg by mouth daily with breakfast.     . fluticasone furoate-vilanterol (BREO ELLIPTA) 200-25 MCG/INH AEPB Inhale 1 puff into the lungs at bedtime.     Marland Kitchen LANTUS SOLOSTAR 100 UNIT/ML Solostar Pen Inject 20 Units into the skin daily at 10 pm.     . levothyroxine (SYNTHROID, LEVOTHROID) 25 MCG tablet Take 25 mcg by mouth daily before breakfast.    . losartan (COZAAR) 25 MG tablet Take 25 mg by mouth at bedtime.     Marland Kitchen lubiprostone (AMITIZA) 8 MCG capsule Take 8 mcg by mouth at bedtime.     . metFORMIN (GLUCOPHAGE) 1000 MG tablet Take 1,000 mg by mouth 2 (two) times daily with a meal.     . metoprolol tartrate (LOPRESSOR) 25 MG tablet Take 25 mg by mouth 2 (two) times daily.    . mometasone (NASONEX) 50 MCG/ACT nasal spray Place 2 sprays into the nose 2 (two) times daily.     . montelukast (SINGULAIR) 10 MG tablet Take 10 mg by mouth at bedtime.     . naproxen (NAPROSYN) 500 MG tablet Take 500 mg by mouth 2 (two) times  daily with a meal.     . Olopatadine HCl (PATADAY) 0.2 % SOLN Apply 1 drop to eye daily as needed (for dry eyes).     . Omega-3 Fatty Acids (FISH OIL) 1200 MG CAPS Take 1 capsule by mouth 2 (two) times daily.     . polyethylene glycol (MIRALAX / GLYCOLAX) packet Take 17 g by mouth daily with breakfast.     . pravastatin (PRAVACHOL) 40 MG tablet Take 40 mg by mouth at bedtime.    . pregabalin (LYRICA) 75 MG capsule Takes 75mg  by mouth twice a day, may take an extra capsule a day if needed for pain    . silver sulfADIAZINE (SILVADENE) 1 % cream Apply 1 application topically 2 (two) times daily.    . SUMAtriptan (IMITREX) 100 MG tablet Take 100 mg by mouth every 2 (two) hours as needed for migraine. May repeat in 2 hours if headache persists  or recurs.    Marland Kitchen UNIFINE PENTIPS 31G X 6 MM MISC     . Vitamin D, Ergocalciferol, (DRISDOL) 50000 units CAPS capsule Take 50,000 Units by mouth every Monday.    . zolpidem (AMBIEN) 10 MG tablet Take 10 mg by mouth at bedtime.      No current facility-administered medications on file prior to visit.     ALLERGIES: Allergies  Allergen Reactions  . Accupril [Quinapril Hcl] Cough  . Neurontin [Gabapentin] Other (See Comments)    Didn't work  . Prednisone Other (See Comments)    Has to be cautious due to BS  . Topamax [Topiramate] Other (See Comments)    Didn't work  . Levaquin [Levofloxacin] Rash    FAMILY HISTORY: No family history on file.  SOCIAL HISTORY: Social History   Social History  . Marital status: Widowed    Spouse name: N/A  . Number of children: N/A  . Years of education: N/A   Occupational History  . Not on file.   Social History Main Topics  . Smoking status: Never Smoker  . Smokeless tobacco: Never Used  . Alcohol use No  . Drug use: No  . Sexual activity: Not on file   Other Topics Concern  . Not on file   Social History Narrative  . No narrative on file    REVIEW OF SYSTEMS: Constitutional: No fevers, chills, or  sweats, no generalized fatigue, change in appetite Eyes: No visual changes, double vision, eye pain Ear, nose and throat: No hearing loss, ear pain, nasal congestion, sore throat Cardiovascular: No chest pain, palpitations Respiratory:  No shortness of breath at rest or with exertion, wheezes GastrointestinaI: No nausea, vomiting, diarrhea, abdominal pain, fecal incontinence Genitourinary:  No dysuria, urinary retention or frequency Musculoskeletal:  + neck pain, back pain Integumentary: No rash, pruritus, skin lesions Neurological: as above Psychiatric: No depression, insomnia, anxiety Endocrine: No palpitations, fatigue, diaphoresis, mood swings, change in appetite, change in weight, increased thirst Hematologic/Lymphatic:  No anemia, purpura, petechiae. Allergic/Immunologic: no itchy/runny eyes, nasal congestion, recent allergic reactions, rashes  PHYSICAL EXAM: Vitals:   10/09/15 1307  BP: 132/76  Pulse: 64  Temp: 98.2 F (36.8 C)   General: No acute distress Head:  Normocephalic/atraumatic Eyes: Fundoscopic exam shows bilateral sharp discs, no vessel changes, exudates, or hemorrhages Neck: supple, no paraspinal tenderness, full range of motion Back: No paraspinal tenderness Heart: regular rate and rhythm Lungs: Clear to auscultation bilaterally. Vascular: No carotid bruits. Skin/Extremities: No rash, no edema Neurological Exam: Mental status: alert and oriented to person, place, and time, no dysarthria or aphasia, Fund of knowledge is appropriate.  Recent and remote memory are intact.  Attention and concentration are normal.    Able to name objects and repeat phrases. CDT 5/5 MMSE - Mini Mental State Exam 10/09/2015  Orientation to time 5  Orientation to Place 5  Registration 3  Attention/ Calculation 5  Recall 3  Language- name 2 objects 2  Language- repeat 1  Language- follow 3 step command 3  Language- read & follow direction 1  Write a sentence 1  Copy design 1    Total score 30   Cranial nerves: CN I: not tested CN II: pupils equal, round and reactive to light, visual fields intact, fundi unremarkable. CN III, IV, VI:  full range of motion, no nystagmus, no ptosis CN V: facial sensation intact CN VII: upper and lower face symmetric CN VIII: hearing intact to finger rub CN IX, X: gag  intact, uvula midline CN XI: sternocleidomastoid and trapezius muscles intact CN XII: tongue midline Bulk & Tone: normal, no fasciculations. Motor: 5/5 throughout with no pronator drift. Sensation: decreased pin on right UE, reports hyperesthesia to pin on left foot, decreased vibration to right ankle, left knee. Romberg test negative Deep Tendon Reflexes: +1 throughout, no ankle clonus Plantar responses: downgoing bilaterally Cerebellar: no incoordination on finger to nose, heel to shin. No dysdiadochokinesia Gait: narrow-based and steady, able to tandem walk adequately. Tremor: none  IMPRESSION: This is a 60 year old right-handed woman with a history of history of hypertension, diabetes, sleep apnea on CPAP, sarcoidosis, B12 deficiency, hypothyroidism, anxiety, migraines, presenting for evaluation of a transient episode of loss of awareness last 07/24/2015. Her neurological exam shows patchy sensory changes and length-dependent neuropathy. The etiology of the episode is unclear, seizure is a consideration. MRI brain with and without contrast and a 1-hour EEG will be ordered to assess for focal abnormalities that increase risk for recurrent seizures. We discussed that after an initial seizure, unless there are significant risk factors, an abnormal neurological exam, an EEG showing epileptiform abnormalities, and/or abnormal neuroimaging, treatment with an antiepileptic drug is not indicated. We discussed 10% of the population may have a single seizure. Patients with a single unprovoked seizure have a recurrence rate of 33% after a single seizure and 73% after a second  seizure. She is also concerned about neuropathic pain. EMG/NCV of the right UE and LE will be ordered to further evaluate her symptoms. She is agreeable to increasing Lyrica to 150mg  BID and if psychiatric symptoms arise, agreeable to seeing Behavioral Medicine. We discussed [place] driving restrictions which indicate a patient needs to free of seizures or events of altered awareness for 6 months prior to resuming driving. The patient agreed to comply with these restrictions. She will follow-up in 2 months and knows to call for any changes.   Thank you for allowing me to participate in the care of this patient. Please do not hesitate to call for any questions or concerns.   Ellouise Newer, M.D.  CC: Dr. Robina Ade O'Buch, PA-C

## 2015-10-09 NOTE — Patient Instructions (Signed)
1. Schedule MRI brain with and without contrast 2. Schedule 1-hour sleep-deprived EEG 3. Schedule EMG/NCV of right UE and LE with Dr. Posey Pronto 4. Increase Lyrica to 150mg  twice a day 5. As per [place] driving laws, no driving after an episode of loss of awareness until 6 months event-free 6. Follow-up in 2 months, call for any changes

## 2015-10-10 ENCOUNTER — Encounter: Payer: Self-pay | Admitting: Neurology

## 2015-10-10 DIAGNOSIS — R404 Transient alteration of awareness: Principal | ICD-10-CM

## 2015-10-10 DIAGNOSIS — G629 Polyneuropathy, unspecified: Secondary | ICD-10-CM

## 2015-10-10 HISTORY — DX: Transient alteration of awareness: R40.4

## 2015-10-11 DIAGNOSIS — M542 Cervicalgia: Secondary | ICD-10-CM | POA: Diagnosis not present

## 2015-10-14 DIAGNOSIS — G4733 Obstructive sleep apnea (adult) (pediatric): Secondary | ICD-10-CM | POA: Diagnosis not present

## 2015-10-14 DIAGNOSIS — R0600 Dyspnea, unspecified: Secondary | ICD-10-CM | POA: Diagnosis not present

## 2015-10-14 DIAGNOSIS — R918 Other nonspecific abnormal finding of lung field: Secondary | ICD-10-CM | POA: Diagnosis not present

## 2015-10-14 DIAGNOSIS — D860 Sarcoidosis of lung: Secondary | ICD-10-CM | POA: Diagnosis not present

## 2015-10-16 ENCOUNTER — Ambulatory Visit (INDEPENDENT_AMBULATORY_CARE_PROVIDER_SITE_OTHER): Payer: Commercial Managed Care - HMO | Admitting: Sports Medicine

## 2015-10-16 ENCOUNTER — Other Ambulatory Visit: Payer: Commercial Managed Care - HMO

## 2015-10-16 DIAGNOSIS — M79671 Pain in right foot: Secondary | ICD-10-CM

## 2015-10-16 DIAGNOSIS — E349 Endocrine disorder, unspecified: Secondary | ICD-10-CM

## 2015-10-16 DIAGNOSIS — G63 Polyneuropathy in diseases classified elsewhere: Secondary | ICD-10-CM

## 2015-10-16 DIAGNOSIS — M205X1 Other deformities of toe(s) (acquired), right foot: Secondary | ICD-10-CM

## 2015-10-16 DIAGNOSIS — M79672 Pain in left foot: Secondary | ICD-10-CM

## 2015-10-16 DIAGNOSIS — M2021 Hallux rigidus, right foot: Secondary | ICD-10-CM

## 2015-10-16 DIAGNOSIS — G4733 Obstructive sleep apnea (adult) (pediatric): Secondary | ICD-10-CM | POA: Diagnosis not present

## 2015-10-16 DIAGNOSIS — M7740 Metatarsalgia, unspecified foot: Secondary | ICD-10-CM

## 2015-10-16 DIAGNOSIS — M67331 Transient synovitis, right wrist: Secondary | ICD-10-CM | POA: Diagnosis not present

## 2015-10-17 DIAGNOSIS — G4733 Obstructive sleep apnea (adult) (pediatric): Secondary | ICD-10-CM | POA: Diagnosis not present

## 2015-10-18 NOTE — Progress Notes (Signed)
Patient discussed with medical assistant, Betha. Patient was measured for Diabetic shoes and will be notified when time to return for shoe fitting and dispensing. -Dr. Keshan Reha 

## 2015-10-23 ENCOUNTER — Other Ambulatory Visit: Payer: Self-pay

## 2015-10-25 ENCOUNTER — Other Ambulatory Visit: Payer: Self-pay

## 2015-10-25 DIAGNOSIS — E039 Hypothyroidism, unspecified: Secondary | ICD-10-CM | POA: Diagnosis not present

## 2015-10-25 DIAGNOSIS — E559 Vitamin D deficiency, unspecified: Secondary | ICD-10-CM | POA: Diagnosis not present

## 2015-10-25 DIAGNOSIS — K5900 Constipation, unspecified: Secondary | ICD-10-CM | POA: Diagnosis not present

## 2015-10-25 DIAGNOSIS — E785 Hyperlipidemia, unspecified: Secondary | ICD-10-CM | POA: Diagnosis not present

## 2015-10-25 DIAGNOSIS — Z79899 Other long term (current) drug therapy: Secondary | ICD-10-CM | POA: Diagnosis not present

## 2015-10-25 DIAGNOSIS — E1149 Type 2 diabetes mellitus with other diabetic neurological complication: Secondary | ICD-10-CM | POA: Diagnosis not present

## 2015-10-25 DIAGNOSIS — K649 Unspecified hemorrhoids: Secondary | ICD-10-CM | POA: Diagnosis not present

## 2015-10-25 DIAGNOSIS — Z6841 Body Mass Index (BMI) 40.0 and over, adult: Secondary | ICD-10-CM | POA: Diagnosis not present

## 2015-10-26 ENCOUNTER — Ambulatory Visit
Admission: RE | Admit: 2015-10-26 | Discharge: 2015-10-26 | Disposition: A | Discharge status: Home / Self Care | Payer: Commercial Managed Care - HMO | Source: Ambulatory Visit | Attending: Neurology | Admitting: Neurology

## 2015-10-26 DIAGNOSIS — R404 Transient alteration of awareness: Secondary | ICD-10-CM

## 2015-10-26 DIAGNOSIS — R410 Disorientation, unspecified: Secondary | ICD-10-CM | POA: Diagnosis not present

## 2015-10-26 DIAGNOSIS — G629 Polyneuropathy, unspecified: Secondary | ICD-10-CM

## 2015-10-26 MED ORDER — GADOBENATE DIMEGLUMINE 529 MG/ML IV SOLN
20.0000 mL | Freq: Once | INTRAVENOUS | Status: AC | PRN
  Administered 2015-10-26: 20 mL via INTRAVENOUS

## 2015-10-28 ENCOUNTER — Telehealth: Payer: Self-pay

## 2015-10-28 NOTE — Telephone Encounter (Signed)
Patient notified MRI of brain looks overall okay, no evidence of tumor, stroke, or bleed. It shows age-related changes per Dr Delice Lesch.

## 2015-10-29 ENCOUNTER — Encounter: Payer: Self-pay | Admitting: Neurology

## 2015-10-30 ENCOUNTER — Other Ambulatory Visit: Payer: Self-pay

## 2015-10-30 DIAGNOSIS — M542 Cervicalgia: Secondary | ICD-10-CM | POA: Diagnosis not present

## 2015-11-06 ENCOUNTER — Other Ambulatory Visit: Payer: Self-pay

## 2015-11-06 DIAGNOSIS — Z9013 Acquired absence of bilateral breasts and nipples: Secondary | ICD-10-CM | POA: Diagnosis not present

## 2015-11-14 DIAGNOSIS — M542 Cervicalgia: Secondary | ICD-10-CM | POA: Diagnosis not present

## 2015-11-19 ENCOUNTER — Ambulatory Visit: Payer: Commercial Managed Care - HMO | Admitting: Neurology

## 2015-11-19 ENCOUNTER — Ambulatory Visit (INDEPENDENT_AMBULATORY_CARE_PROVIDER_SITE_OTHER): Payer: Commercial Managed Care - HMO | Admitting: Neurology

## 2015-11-19 DIAGNOSIS — G5601 Carpal tunnel syndrome, right upper limb: Secondary | ICD-10-CM

## 2015-11-19 DIAGNOSIS — G629 Polyneuropathy, unspecified: Secondary | ICD-10-CM

## 2015-11-19 DIAGNOSIS — R404 Transient alteration of awareness: Secondary | ICD-10-CM | POA: Diagnosis not present

## 2015-11-19 NOTE — Procedures (Signed)
[facility]  [address]  [place], [place] [address] Tel: [phone] Fax:  [phone] Test Date:  11/19/2015  Patient: Melanie Meyers DOB: Jan 04, 1956 Physician: Narda Amber, DO  Sex: Female Height: 5\' 0"  Ref Phys: Ellouise Newer, M.D.  ID#: [id number] Temp: 32.6C Technician: Jerilynn Mages. Dean   Patient Complaints: This is a 60 year old female referred for evaluation of generalized weakness and paresthesias.   NCV & EMG Findings: Extensive electrodiagnostic testing of the right upper and lower extremities shows:  1. Right median, ulnar, sural, and superficial peroneal sensory responses are within normal limits. Right mixed palmar responses are abnormal. 2. Right median, ulnar, peroneal, and tibial motor responses are within normal limits. 3. There is no evidence of active or chronic motor axon loss changes affecting any of the tested muscles. Motor unit configuration and recruitment pattern is within normal limits.  Impression: 1. Right median neuropathy at or distal to the wrist, consistent with clinical diagnosis of carpal tunnel syndrome. Overall, these findings are mild in degree electrically. 2. There is no evidence of a large fiber sensorimotor polyneuropathy, diffuse myopathy, or cervical/lumbosacral radiculopathy affecting the right side.   ___________________________ Narda Amber, DO    Nerve Conduction Studies Anti Sensory Summary Table   Stim Site NR Peak (ms) Norm Peak (ms) P-T Amp (V) Norm P-T Amp  Right Median Anti Sensory (2nd Digit)  32.6C  Wrist    3.6 <3.6 27.1 >15  Right Radial Anti Sensory (Base 1st Digit)  32.6C  Wrist    1.9 <2.7 57.7 >14  Right Sup Peroneal Anti Sensory (Ant Lat Mall)  12 cm    3.4 <4.6 5.9 >4  Right Sural Anti Sensory (Lat Mall)  32.6C  Calf    3.1 <4.6 11.6 >4  Right Ulnar Anti Sensory (5th Digit)  32.6C  Wrist    2.6 <3.1 18.6 >10   Motor Summary Table   Stim Site NR Onset (ms) Norm Onset (ms) O-P Amp (mV)  Norm O-P Amp Site1 Site2 Delta-0 (ms) Dist (cm) Vel (m/s) Norm Vel (m/s)  Right Median Motor (Abd Poll Brev)  32.6C  Wrist    3.8 <4.0 6.2 >6 Elbow Wrist 3.2 18.0 56 >50  Elbow    7.0  6.2         Right Peroneal Motor (Ext Dig Brev)  Ankle    3.5 <6.0 4.3 >2.5 B Fib Ankle 6.0 29.0 48 >40  B Fib    9.5  4.0  Poplt B Fib 2.0 10.0 50 >40  Poplt    11.5  3.4         Right Tibial Motor (Abd Hall Brev)  Ankle    2.7 <6.0 4.4 >4 Knee Ankle 8.0 38.0 48 >40  Knee    10.7  4.0         Right Ulnar Motor (Abd Dig Minimi)  32.6C  Wrist    3.0 <3.1 10.6 >7 B Elbow Wrist 2.5 15.0 60 >50  B Elbow    5.5  9.3  A Elbow B Elbow 1.5 10.0 67 >50  A Elbow    7.0  8.7          Comparison Summary Table   Stim Site NR Peak (ms) Norm Peak (ms) P-T Amp (V) Site1 Site2 Delta-P (ms) Norm Delta (ms)  Right Median/Ulnar Palm Comparison (Wrist - 8cm)  32.6C  Median Palm    2.2 <2.2 57.8 Median Palm Ulnar Palm 0.0   Ulnar [employer]e  2.2 <2.2 21.0       H Reflex Studies   NR H-Lat (ms) Lat Norm (ms) L-R H-Lat (ms)  Right Tibial (Gastroc)  32.6C     33.74 <35    EMG   Side Muscle Ins Act Fibs Psw Fasc Number Recrt Dur Dur. Amp Amp. Poly Poly. Comment  Right 1stDorInt Nml Nml Nml Nml Nml Nml Nml Nml Nml Nml Nml Nml N/A  Right AntTibialis Nml Nml Nml Nml Nml Nml Nml Nml Nml Nml Nml Nml N/A  Right Gastroc Nml Nml Nml Nml Nml Nml Nml Nml Nml Nml Nml Nml N/A  Right Flex Dig Long Nml Nml Nml Nml Nml Nml Nml Nml Nml Nml Nml Nml N/A  Right RectFemoris Nml Nml Nml Nml Nml Nml Nml Nml Nml Nml Nml Nml N/A  Right GluteusMed Nml Nml Nml Nml Nml Nml Nml Nml Nml Nml Nml Nml N/A  Right BicepsFemS Nml Nml Nml Nml Nml Nml Nml Nml Nml Nml Nml Nml N/A  Right Abd Poll Brev Nml Nml Nml Nml Nml Nml Nml Nml Nml Nml Nml Nml N/A  Right Ext Indicis Nml Nml Nml Nml Nml Nml Nml Nml Nml Nml Nml Nml N/A  Right PronatorTeres Nml Nml Nml Nml Nml Nml Nml Nml Nml Nml Nml Nml N/A  Right Biceps Nml Nml Nml Nml Nml Nml Nml Nml Nml Nml Nml Nml  N/A  Right Triceps Nml Nml Nml Nml Nml Nml Nml Nml Nml Nml Nml Nml N/A  Right Deltoid Nml Nml Nml Nml Nml Nml Nml Nml Nml Nml Nml Nml N/A      Waveforms:

## 2015-11-26 ENCOUNTER — Other Ambulatory Visit: Payer: Self-pay | Admitting: *Deleted

## 2015-11-26 ENCOUNTER — Telehealth: Payer: Self-pay

## 2015-11-26 DIAGNOSIS — G4733 Obstructive sleep apnea (adult) (pediatric): Secondary | ICD-10-CM | POA: Diagnosis not present

## 2015-11-26 DIAGNOSIS — G5601 Carpal tunnel syndrome, right upper limb: Secondary | ICD-10-CM

## 2015-11-26 MED ORDER — AMBULATORY NON FORMULARY MEDICATION: 1.0000 [IU] | Freq: Every day | 0 refills | Status: DC

## 2015-11-26 NOTE — Procedures (Signed)
ELECTROENCEPHALOGRAM REPORT  Date of Study: 11/19/2015  Patient's Name: Melanie Meyers MRN: [id number] Date of Birth: July 06, 1955  Referring Provider: Dr. Ellouise Newer  Clinical History: This is a 60 year old woman with a transient episode of loss of awareness last July 2017.  Medications: Lyrica, Imitrex, Ambien, Abilify, aspirin, Biotin, Zyrtec, Nexium, Lantus, Synthroid, Cozaar, Glucophage, Lopressor  Technical Summary: A multichannel digital 1-hour sleep-deprived EEG recording measured by the international 10-20 system with electrodes applied with paste and impedances below 5000 ohms performed in our laboratory with EKG monitoring in an awake and asleep patient.  Hyperventilation and photic stimulation were performed.  The digital EEG was referentially recorded, reformatted, and digitally filtered in a variety of bipolar and referential montages for optimal display.    Description: The patient is awake and asleep during the recording.  During maximal wakefulness, there is a symmetric, medium voltage 9 Hz posterior dominant rhythm that attenuates with eye opening.  The record is symmetric.  During drowsiness and sleep, there is an increase in theta slowing of the background.  Vertex waves and symmetric sleep spindles were seen.  Hyperventilation and photic stimulation did not elicit any abnormalities.  There were no epileptiform discharges or electrographic seizures seen.    EKG lead showed sinus bradycardia.  Impression: This 1-hour awake and asleep EEG is normal.    Clinical Correlation: A normal EEG does not exclude a clinical diagnosis of epilepsy.  If further clinical questions remain, prolonged EEG may be helpful.  Clinical correlation is advised.   Ellouise Newer, M.D.

## 2015-11-26 NOTE — Telephone Encounter (Signed)
Contacted patient to verify order will be placed in mail today.

## 2015-11-26 NOTE — Telephone Encounter (Signed)
Ok, pls send, thanks

## 2015-11-26 NOTE — Telephone Encounter (Signed)
-----   Message from Cameron Sprang, MD sent at 11/26/2015 10:01 AM EST ----- Pls let her know the EEG showed normal brain waves. The nerve test was overall okay, it showed carpal tunnel on the right wrist, would recommend she start wearing a wrist splint she can get at a local pharmacy. Thanks

## 2015-11-26 NOTE — Telephone Encounter (Signed)
Patient notified. She wants to know if we can send her an order in the mail for the splint so her insurance will pay for it?

## 2015-12-04 DIAGNOSIS — Z1389 Encounter for screening for other disorder: Secondary | ICD-10-CM | POA: Diagnosis not present

## 2015-12-04 DIAGNOSIS — Z0000 Encounter for general adult medical examination without abnormal findings: Secondary | ICD-10-CM | POA: Diagnosis not present

## 2015-12-04 DIAGNOSIS — Z6841 Body Mass Index (BMI) 40.0 and over, adult: Secondary | ICD-10-CM | POA: Diagnosis not present

## 2015-12-04 DIAGNOSIS — G629 Polyneuropathy, unspecified: Secondary | ICD-10-CM | POA: Diagnosis not present

## 2015-12-06 DIAGNOSIS — K581 Irritable bowel syndrome with constipation: Secondary | ICD-10-CM | POA: Diagnosis not present

## 2015-12-09 DIAGNOSIS — G629 Polyneuropathy, unspecified: Secondary | ICD-10-CM | POA: Diagnosis not present

## 2015-12-12 DIAGNOSIS — G2581 Restless legs syndrome: Secondary | ICD-10-CM | POA: Diagnosis not present

## 2015-12-12 DIAGNOSIS — Z6841 Body Mass Index (BMI) 40.0 and over, adult: Secondary | ICD-10-CM | POA: Diagnosis not present

## 2015-12-12 DIAGNOSIS — F419 Anxiety disorder, unspecified: Secondary | ICD-10-CM | POA: Diagnosis not present

## 2015-12-13 DIAGNOSIS — G629 Polyneuropathy, unspecified: Secondary | ICD-10-CM | POA: Diagnosis not present

## 2015-12-20 ENCOUNTER — Encounter: Payer: Self-pay | Admitting: Sports Medicine

## 2015-12-20 ENCOUNTER — Ambulatory Visit (INDEPENDENT_AMBULATORY_CARE_PROVIDER_SITE_OTHER): Payer: Commercial Managed Care - HMO | Admitting: Sports Medicine

## 2015-12-20 DIAGNOSIS — M2021 Hallux rigidus, right foot: Secondary | ICD-10-CM

## 2015-12-20 DIAGNOSIS — M79671 Pain in right foot: Secondary | ICD-10-CM

## 2015-12-20 DIAGNOSIS — M7740 Metatarsalgia, unspecified foot: Secondary | ICD-10-CM

## 2015-12-20 DIAGNOSIS — M79672 Pain in left foot: Secondary | ICD-10-CM

## 2015-12-20 DIAGNOSIS — E349 Endocrine disorder, unspecified: Secondary | ICD-10-CM

## 2015-12-20 DIAGNOSIS — G63 Polyneuropathy in diseases classified elsewhere: Secondary | ICD-10-CM

## 2015-12-20 DIAGNOSIS — M205X1 Other deformities of toe(s) (acquired), right foot: Secondary | ICD-10-CM

## 2015-12-20 NOTE — Progress Notes (Signed)
Patient came in to pick up diabetic shoes. We will have to hold shoes until updated/new papers are signed by her medical doctor since authorization has expired. -Dr. Cannon Kettle

## 2015-12-25 ENCOUNTER — Ambulatory Visit (INDEPENDENT_AMBULATORY_CARE_PROVIDER_SITE_OTHER): Payer: Commercial Managed Care - HMO | Admitting: Sports Medicine

## 2015-12-25 ENCOUNTER — Encounter: Payer: Self-pay | Admitting: Sports Medicine

## 2015-12-25 DIAGNOSIS — M7740 Metatarsalgia, unspecified foot: Secondary | ICD-10-CM

## 2015-12-25 DIAGNOSIS — E119 Type 2 diabetes mellitus without complications: Secondary | ICD-10-CM

## 2015-12-25 DIAGNOSIS — G63 Polyneuropathy in diseases classified elsewhere: Secondary | ICD-10-CM | POA: Diagnosis not present

## 2015-12-25 DIAGNOSIS — M79671 Pain in right foot: Secondary | ICD-10-CM

## 2015-12-25 DIAGNOSIS — M79672 Pain in left foot: Secondary | ICD-10-CM

## 2015-12-25 DIAGNOSIS — M205X1 Other deformities of toe(s) (acquired), right foot: Secondary | ICD-10-CM | POA: Diagnosis not present

## 2015-12-25 DIAGNOSIS — E349 Endocrine disorder, unspecified: Secondary | ICD-10-CM

## 2015-12-25 DIAGNOSIS — M2021 Hallux rigidus, right foot: Secondary | ICD-10-CM

## 2015-12-25 NOTE — Progress Notes (Signed)
Patient was seen by medical assistant. Diabetic shoes fitted and dispensed with wear and break in pattern explained. Patient to follow up as scheduled or sooner if problems or issues arise. -Dr. Cannon Kettle

## 2016-01-01 ENCOUNTER — Encounter: Payer: Self-pay | Admitting: Neurology

## 2016-01-01 ENCOUNTER — Ambulatory Visit (INDEPENDENT_AMBULATORY_CARE_PROVIDER_SITE_OTHER): Payer: Commercial Managed Care - HMO | Admitting: Neurology

## 2016-01-01 VITALS — BP 132/74 | HR 72 | Ht 60.0 in | Wt 226.2 lb

## 2016-01-01 DIAGNOSIS — M1711 Unilateral primary osteoarthritis, right knee: Secondary | ICD-10-CM | POA: Diagnosis not present

## 2016-01-01 DIAGNOSIS — G629 Polyneuropathy, unspecified: Secondary | ICD-10-CM

## 2016-01-01 DIAGNOSIS — G8929 Other chronic pain: Secondary | ICD-10-CM | POA: Diagnosis not present

## 2016-01-01 DIAGNOSIS — R404 Transient alteration of awareness: Secondary | ICD-10-CM

## 2016-01-01 DIAGNOSIS — M7051 Other bursitis of knee, right knee: Secondary | ICD-10-CM | POA: Diagnosis not present

## 2016-01-01 DIAGNOSIS — M25561 Pain in right knee: Secondary | ICD-10-CM | POA: Diagnosis not present

## 2016-01-01 DIAGNOSIS — G5601 Carpal tunnel syndrome, right upper limb: Secondary | ICD-10-CM | POA: Diagnosis not present

## 2016-01-01 DIAGNOSIS — M7052 Other bursitis of knee, left knee: Secondary | ICD-10-CM | POA: Diagnosis not present

## 2016-01-01 MED ORDER — GABAPENTIN 300 MG PO CAPS: ORAL_CAPSULE | ORAL | 11 refills | Status: DC

## 2016-01-01 NOTE — Patient Instructions (Signed)
1. Start Gabapentin 300mg : Take 1 capsule at night for 2 days, then increase to 2 capsules at night for 2 days. If doing well and not too drowsy, increase to 2 capsules twice a day. For the Lyrica, stop the evening dose and take Lyrica 150mg  in the morning for 4 days, then stop. 2. Continue using wrist splint on right 3. Follow-up in 3 months, call for any changes

## 2016-01-01 NOTE — Progress Notes (Signed)
NEUROLOGY FOLLOW UP OFFICE NOTE  Bari Mozie MD:[id number]  HISTORY OF PRESENT ILLNESS: I had the pleasure of seeing Melanie Meyers in follow-up in the neurology clinic on 01/01/2016.  The patient was last seen 2 months ago after an episode of loss of awareness last July 2017. She is again accompanied by her daughter who helps supplement the history today.  Records and images were personally reviewed where available.  I personally reviewed MRI brain with and without contrast which did not show any acute changes, hippocampi symmetric with no abnormal signal or enhancement seen. Her 1-hour wake and sleep EEG was normal. Single-lead EKG showed sinus bradycardia. She was also reporting numbness and tingling in both arms and legs, EMG/NCV of the right UE and LE showed no evidence of large fiber neuropathy, there was right median neuropathy (carpal tunnel syndrome), mild in degree. She wears the wrist splint at night. She and her daughter deny any further episodes of decreased responsiveness. She denies any headaches, dizziness, focal weakness, no falls. Mood is pretty good. Lyrica dose was increased to 150mg  BID on her last visit for the neuropathy, she has not noticed any difference, and worries about weight gain. She is asking to switch to gabapentin, which she took in the past and would like to re-try.   HPI 10/09/15: This is a 60 yo RH woman with a history of hypertension, diabetes, sleep apnea on CPAP, sarcoidosis, B12 deficiency, hypothyroidism, anxiety, migraines, with a transient episode of loss of awareness last 07/24/2015. She recalls sitting in the car with her daughter then suddenly feeling sleepy. She recalls closing her eyes, then waking up in the ER. She then went to [name], records reviewed. Her other daughter witnessed her head bobbing back and forth, she was not responding to questions although she was reported to be awake. She was told her face was droopy and her speech was slurred when  she asked her daughter why she was being taken to the ER (she does not recall this). They stopped on the road and she got out of the car, then fell backwards, no injuries. They went to a different ER but left due to a long wait, then went to [name] where she was back to baseline. The episode lasted 45 minutes. She reported a mild headache with photophobia all day prior to the event, and was mildly nauseated in the car. She reported feeling mildly numb on her left arm, but today denies any focal symptoms. Bloodwork showed unremarkable CBC, CMP, negative ethanol level. I personally reviewed head CT without contrast which did not show any acute changes. She denies any further episodes since then, no history of similar episodes in the past.  She and her daughter deny any other staring/unresponsive episodes or gaps in time, no olfactory/gustatory hallucinations, deja vu, rising epigastric sensation, focal numbness/tingling/weakness, myoclonic jerks. She has a history of migraines that used to respond to Relpax, however over the past 3-4 months, she has been having a constant throbbing headache over the vertex and occipital regions, waxing and waning in intensity, with associated photosensitivity, no nausea/vomiting. Imitrex does not help as much as the Relpax, however insurance will not cover Relpax. She has occasional dizziness described as lightheadedness and occasional spinning, lasting a few seconds, which can occur with any position. She has chronic neck and back pain. She has a history of neuropathy with numbness and tingling in both arms and legs, she states the neuropathy in her arms are due to B12 deficiency,  and due to diabetic neuropathy in her legs. Her last HbA1c was 6.1. She is taking Lyrica 75mg  BID. On higher dose (150mg  BID), she was told by her son she had psychiatric problems, which hurt her. She does not see Psychiatry. She reports unrefreshing sleep despite use of CPAP machine. She has been forgetful  for the past few months, she forgets words. She drives and denies getting lost driving. She lives with her daughter and denies any missed bill payments or missed medications. Her mother and maternal aunt had dementia. She reports starting Paxil a week before the episode, this was switched to Abilify 2 weeks ago.   She had a normal birth and early development.  There is no history of febrile convulsions, CNS infections such as meningitis/encephalitis, significant traumatic brain injury, neurosurgical procedures, or family history of seizures.    PAST MEDICAL HISTORY: Past Medical History:  Diagnosis Date  . Diabetes mellitus without complication (Melanie Meyers)   . GERD (gastroesophageal reflux disease)   . High cholesterol   . Hypertension   . Sarcoidosis ([place])   . Thyroid disease     MEDICATIONS: Current Outpatient Prescriptions on File Prior to Visit  Medication Sig Dispense Refill  . ACCU-CHEK AVIVA PLUS test strip 1 each by Other route as needed (for BS).     . Albuterol Sulfate (PROAIR RESPICLICK) 123XX123 (90 Base) MCG/ACT AEPB Inhale 1-2 puffs into the lungs every 4 (four) hours as needed (for shortness of breath).    . AMBULATORY NON FORMULARY MEDICATION 1 Units by Other route daily. Wear right wrist splint daily. 1 Units 0  . ARIPiprazole (ABILIFY) 10 MG tablet Take 10 mg by mouth daily with breakfast.    . aspirin EC 81 MG tablet Take 81 mg by mouth at bedtime.     Marland Kitchen azelastine (ASTELIN) 0.1 % nasal spray Place 2 sprays into both nostrils 2 (two) times daily. Use in each nostril as directed    . BIOTIN PO Take 5,000 mcg by mouth at bedtime.     . cetirizine (ZYRTEC) 10 MG tablet Take 10 mg by mouth at bedtime.     Marland Kitchen esomeprazole (NEXIUM) 40 MG capsule Take 40 mg by mouth daily with breakfast.     . fluticasone furoate-vilanterol (BREO ELLIPTA) 200-25 MCG/INH AEPB Inhale 1 puff into the lungs at bedtime.     Marland Kitchen LANTUS SOLOSTAR 100 UNIT/ML Solostar Pen Inject 20 Units into the skin daily at 10  pm.     . levothyroxine (SYNTHROID, LEVOTHROID) 25 MCG tablet Take 25 mcg by mouth daily before breakfast.    . linaclotide (LINZESS) 290 MCG CAPS capsule Take 290 mcg by mouth daily before breakfast.    . losartan (COZAAR) 25 MG tablet Take 25 mg by mouth at bedtime.     . metFORMIN (GLUCOPHAGE) 1000 MG tablet Take 1,000 mg by mouth 2 (two) times daily with a meal.     . metoprolol tartrate (LOPRESSOR) 25 MG tablet Take 25 mg by mouth 2 (two) times daily.    . mometasone (NASONEX) 50 MCG/ACT nasal spray Place 2 sprays into the nose 2 (two) times daily.     . montelukast (SINGULAIR) 10 MG tablet Take 10 mg by mouth at bedtime.     . naproxen (NAPROSYN) 500 MG tablet Take 500 mg by mouth 2 (two) times daily with a meal.     . Olopatadine HCl (PATADAY) 0.2 % SOLN Apply 1 drop to eye daily as needed (for dry eyes).     Marland Kitchen  Omega-3 Fatty Acids (FISH OIL) 1200 MG CAPS Take 1 capsule by mouth 2 (two) times daily.     . polyethylene glycol (MIRALAX / GLYCOLAX) packet Take 17 g by mouth daily with breakfast.     . pravastatin (PRAVACHOL) 40 MG tablet Take 40 mg by mouth at bedtime.    . pregabalin (LYRICA) 150 MG capsule Take 1 capsule (150 mg total) by mouth 2 (two) times daily. 60 capsule 5  . silver sulfADIAZINE (SILVADENE) 1 % cream Apply 1 application topically 2 (two) times daily.    . SUMAtriptan (IMITREX) 100 MG tablet Take 100 mg by mouth every 2 (two) hours as needed for migraine. May repeat in 2 hours if headache persists or recurs.    Marland Kitchen UNIFINE PENTIPS 31G X 6 MM MISC     . Vitamin D, Ergocalciferol, (DRISDOL) 50000 units CAPS capsule Take 50,000 Units by mouth every Monday.    . zolpidem (AMBIEN) 10 MG tablet Take 10 mg by mouth at bedtime.      No current facility-administered medications on file prior to visit.     ALLERGIES: Allergies  Allergen Reactions  . Accupril [Quinapril Hcl] Cough  . Neurontin [Gabapentin] Other (See Comments)    Didn't work  . Prednisone Other (See  Comments)    Has to be cautious due to BS  . Topamax [Topiramate] Other (See Comments)    Didn't work  . Levaquin [Levofloxacin] Rash    FAMILY HISTORY: No family history on file.  SOCIAL HISTORY: Social History   Social History  . Marital status: Widowed    Spouse name: N/A  . Number of children: N/A  . Years of education: N/A   Occupational History  . Not on file.   Social History Main Topics  . Smoking status: Never Smoker  . Smokeless tobacco: Never Used  . Alcohol use No  . Drug use: No  . Sexual activity: Not on file   Other Topics Concern  . Not on file   Social History Narrative  . No narrative on file    REVIEW OF SYSTEMS: Constitutional: No fevers, chills, or sweats, no generalized fatigue, change in appetite Eyes: No visual changes, double vision, eye pain Ear, nose and throat: No hearing loss, ear pain, nasal congestion, sore throat Cardiovascular: No chest pain, palpitations Respiratory:  No shortness of breath at rest or with exertion, wheezes GastrointestinaI: No nausea, vomiting, diarrhea, abdominal pain, fecal incontinence Genitourinary:  No dysuria, urinary retention or frequency Musculoskeletal:  No neck pain, back pain Integumentary: No rash, pruritus, skin lesions Neurological: as above Psychiatric: No depression, insomnia, anxiety Endocrine: No palpitations, fatigue, diaphoresis, mood swings, change in appetite, change in weight, increased thirst Hematologic/Lymphatic:  No anemia, purpura, petechiae. Allergic/Immunologic: no itchy/runny eyes, nasal congestion, recent allergic reactions, rashes  PHYSICAL EXAM: Vitals:   01/01/16 1127  BP: 132/74  Pulse: 72   General: No acute distress Head:  Normocephalic/atraumatic Neck: supple, no paraspinal tenderness, full range of motion Heart:  Regular rate and rhythm Lungs:  Clear to auscultation bilaterally Back: No paraspinal tenderness Skin/Extremities: No rash, no edema Neurological  Exam: alert and oriented to person, place, and time. No aphasia or dysarthria. Fund of knowledge is appropriate.  Recent and remote memory are intact.  Attention and concentration are normal.    Able to name objects and repeat phrases. Cranial nerves: Pupils equal, round, reactive to light.  Extraocular movements intact with no nystagmus. Visual fields full. Facial sensation intact. No facial asymmetry. Tongue,  uvula, palate midline.  Motor: Bulk and tone normal, muscle strength 5/5 throughout with no pronator drift.  Sensation to light touch intact.  No extinction to double simultaneous stimulation.  Deep tendon reflexes +1 throughout, toes downgoing.  Finger to nose testing intact.  Gait narrow-based and steady, able to tandem walk adequately.  Romberg negative.  IMPRESSION: This is a 60 yo RH woman with a history of history of hypertension, diabetes, sleep apnea on CPAP, sarcoidosis, B12 deficiency, hypothyroidism, anxiety, migraines, who had a transient episode of loss of awareness last 07/24/2015.The etiology of the episode is unclear, MRI brain and 1-hour EG were normal. Seizure is a consideration, however we had discussed that after an initial seizure, unless there are significant risk factors, an abnormal neurological exam, an EEG showing epileptiform abnormalities, and/or abnormal neuroimaging, treatment with an antiepileptic drug is not indicated. Continue to monitor symptoms, no further recurrence since July. She is asking to switch from Lyrica to Gabapentin for neuropathic pain. She will reduce Lyrica in half and start gabapentin 300mg  qhs , then uptitrated to 600mg  BID, discontinuing Lyrica after 4 days. Side effects of gabapentin were discussed. Continue using right wrist splint for carpal tunnel syndrome. We again discussed [place] driving restrictions which indicate a patient needs to free of seizures or events of altered awareness for 6 months prior to resuming driving. She will follow-up in 3 months  and knows to call for any changes.   Thank you for allowing me to participate in her care.  Please do not hesitate to call for any questions or concerns.  The duration of this appointment visit was 25 minutes of face-to-face time with the patient.  Greater than 50% of this time was spent in counseling, explanation of diagnosis, planning of further management, and coordination of care.   Ellouise Newer, M.D.   CC: Dr. Rica Records

## 2016-01-02 DIAGNOSIS — J0100 Acute maxillary sinusitis, unspecified: Secondary | ICD-10-CM | POA: Diagnosis not present

## 2016-01-14 DIAGNOSIS — M542 Cervicalgia: Secondary | ICD-10-CM | POA: Diagnosis not present

## 2016-01-31 ENCOUNTER — Ambulatory Visit: Payer: Commercial Managed Care - HMO | Admitting: Sports Medicine

## 2016-02-03 ENCOUNTER — Telehealth: Payer: Self-pay | Admitting: Neurology

## 2016-02-03 DIAGNOSIS — G5601 Carpal tunnel syndrome, right upper limb: Secondary | ICD-10-CM | POA: Diagnosis not present

## 2016-02-03 DIAGNOSIS — G5621 Lesion of ulnar nerve, right upper limb: Secondary | ICD-10-CM | POA: Diagnosis not present

## 2016-02-03 MED ORDER — PREGABALIN 150 MG PO CAPS: 150.0000 mg | ORAL_CAPSULE | Freq: Two times a day (BID) | ORAL | 5 refills | Status: DC

## 2016-02-03 NOTE — Telephone Encounter (Signed)
Ok to go back to Lyrica 150mg  twice a day. Since she was on a low dose of gabapentin, she can just stop it and start the Lyrica. Thanks

## 2016-02-03 NOTE — Telephone Encounter (Signed)
Advised patient of below. RX sent to pharmacy.

## 2016-02-03 NOTE — Telephone Encounter (Signed)
Contacted patient. She states since her visit on 12/31/16 she has had a headache taking the Gabapentin 300mg  one tablet twice a day. Patient states she never started two tablets twice a day. She wants to know if its possible she go back on Lyrica. Please advise.

## 2016-02-03 NOTE — Telephone Encounter (Signed)
Patient wants to talk to someone about going back on the lyrica please call [phone]

## 2016-02-05 ENCOUNTER — Ambulatory Visit: Payer: Commercial Managed Care - HMO | Admitting: Sports Medicine

## 2016-02-10 DIAGNOSIS — Z6841 Body Mass Index (BMI) 40.0 and over, adult: Secondary | ICD-10-CM | POA: Diagnosis not present

## 2016-02-10 DIAGNOSIS — J069 Acute upper respiratory infection, unspecified: Secondary | ICD-10-CM | POA: Diagnosis not present

## 2016-02-13 ENCOUNTER — Ambulatory Visit: Payer: Medicare HMO | Admitting: Sports Medicine

## 2016-02-17 DIAGNOSIS — J301 Allergic rhinitis due to pollen: Secondary | ICD-10-CM | POA: Diagnosis not present

## 2016-02-17 DIAGNOSIS — D860 Sarcoidosis of lung: Secondary | ICD-10-CM | POA: Diagnosis not present

## 2016-02-17 DIAGNOSIS — G4733 Obstructive sleep apnea (adult) (pediatric): Secondary | ICD-10-CM | POA: Diagnosis not present

## 2016-02-17 DIAGNOSIS — R918 Other nonspecific abnormal finding of lung field: Secondary | ICD-10-CM | POA: Diagnosis not present

## 2016-02-20 DIAGNOSIS — R918 Other nonspecific abnormal finding of lung field: Secondary | ICD-10-CM | POA: Diagnosis not present

## 2016-02-20 DIAGNOSIS — D869 Sarcoidosis, unspecified: Secondary | ICD-10-CM | POA: Diagnosis not present

## 2016-02-21 ENCOUNTER — Ambulatory Visit (INDEPENDENT_AMBULATORY_CARE_PROVIDER_SITE_OTHER): Payer: Medicare HMO | Admitting: Sports Medicine

## 2016-02-21 ENCOUNTER — Encounter: Payer: Self-pay | Admitting: Sports Medicine

## 2016-02-21 DIAGNOSIS — D3610 Benign neoplasm of peripheral nerves and autonomic nervous system, unspecified: Secondary | ICD-10-CM | POA: Diagnosis not present

## 2016-02-21 DIAGNOSIS — M79671 Pain in right foot: Secondary | ICD-10-CM | POA: Diagnosis not present

## 2016-02-21 DIAGNOSIS — M7740 Metatarsalgia, unspecified foot: Secondary | ICD-10-CM

## 2016-02-21 DIAGNOSIS — G63 Polyneuropathy in diseases classified elsewhere: Secondary | ICD-10-CM

## 2016-02-21 DIAGNOSIS — M205X1 Other deformities of toe(s) (acquired), right foot: Secondary | ICD-10-CM

## 2016-02-21 DIAGNOSIS — E669 Obesity, unspecified: Secondary | ICD-10-CM | POA: Diagnosis not present

## 2016-02-21 DIAGNOSIS — E349 Endocrine disorder, unspecified: Secondary | ICD-10-CM

## 2016-02-21 DIAGNOSIS — E1169 Type 2 diabetes mellitus with other specified complication: Secondary | ICD-10-CM | POA: Diagnosis not present

## 2016-02-21 DIAGNOSIS — M2021 Hallux rigidus, right foot: Secondary | ICD-10-CM

## 2016-02-21 MED ORDER — TRIAMCINOLONE ACETONIDE 10 MG/ML IJ SUSP: 10.0000 mg | Freq: Once | INTRAMUSCULAR | Status: DC

## 2016-02-21 NOTE — Progress Notes (Signed)
Patient ID: Melanie Meyers, female   DOB: 30-Jun-1955, 61 y.o.   MRN: [id number] Subjective: Melanie Meyers is a 61 y.o. female patient with history of diabetes who return to office with increased burning pain in between 2nd and 3rd toes on right x 3 months worse with shoes or pressure. Denies injury of any other pedal complaints. Patient also desires new diabetic shoes for the year.   Patient Active Problem List   Diagnosis Date Noted  . Transient alteration of awareness 10/10/2015  . Peripheral polyneuropathy ([place]) 10/10/2015  . HTN (hypertension) 06/20/2015  . Hyperlipidemia 06/20/2015  . Morbid obesity ([place]) 06/20/2015  . Metatarsal deformity 05/30/2014  . Metatarsalgia 05/30/2014  . Neuropathy associated with endocrine disorder ([place]) 05/30/2014  . Hand paresthesia 06/27/2013  . Hypothyroidism 06/02/2013  . Type 2 diabetes mellitus ([place]) 06/02/2013  . 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 Nerve root pain 01/27/2013  . Narrowing of intervertebral disc space 01/27/2013  . Myofascial pain 01/27/2013  . Cervical spine pain 11/24/2012  . Cervical osteoarthritis 11/24/2012  . Carpal tunnel syndrome 01/15/2012  . Asthma 01/20/2008   Current Outpatient Prescriptions on File Prior to Visit  Medication Sig Dispense Refill  . ACCU-CHEK AVIVA PLUS test strip 1 each by Other route as needed (for BS).     . Albuterol Sulfate (PROAIR RESPICLICK) 017 (90 Base) MCG/ACT AEPB Inhale 1-2 puffs into the lungs every 4 (four) hours as needed (for shortness of breath).    . AMBULATORY NON FORMULARY MEDICATION 1 Units by Other route daily. Wear right wrist splint daily. 1 Units 0  . ARIPiprazole (ABILIFY) 10 MG tablet Take 10 mg by mouth daily with breakfast.    . aspirin EC 81 MG tablet Take 81 mg by mouth at bedtime.     Marland Kitchen azelastine (ASTELIN) 0.1 % nasal spray Place 2 sprays into both nostrils 2 (two) times daily. Use in each nostril as directed    .  BIOTIN PO Take 5,000 mcg by mouth at bedtime.     . cetirizine (ZYRTEC) 10 MG tablet Take 10 mg by mouth at bedtime.     Marland Kitchen esomeprazole (NEXIUM) 40 MG capsule Take 40 mg by mouth daily with breakfast.     . fluticasone furoate-vilanterol (BREO ELLIPTA) 200-25 MCG/INH AEPB Inhale 1 puff into the lungs at bedtime.     Marland Kitchen LANTUS SOLOSTAR 100 UNIT/ML Solostar Pen Inject 20 Units into the skin daily at 10 pm.     . levothyroxine (SYNTHROID, LEVOTHROID) 25 MCG tablet Take 25 mcg by mouth daily before breakfast.    . linaclotide (LINZESS) 290 MCG CAPS capsule Take 290 mcg by mouth daily before breakfast.    . losartan (COZAAR) 25 MG tablet Take 25 mg by mouth at bedtime.     . metFORMIN (GLUCOPHAGE) 1000 MG tablet Take 1,000 mg by mouth 2 (two) times daily with a meal.     . metoprolol tartrate (LOPRESSOR) 25 MG tablet Take 25 mg by mouth 2 (two) times daily.    . mometasone (NASONEX) 50 MCG/ACT nasal spray Place 2 sprays into the nose 2 (two) times daily.     . montelukast (SINGULAIR) 10 MG tablet Take 10 mg by mouth at bedtime.     . naproxen (NAPROSYN) 500 MG tablet Take 500 mg by mouth 2 (two) times daily with a meal.     . Olopatadine HCl (PATADAY) 0.2 % SOLN Apply 1 drop to eye daily as needed (  for dry eyes).     . Omega-3 Fatty Acids (FISH OIL) 1200 MG CAPS Take 1 capsule by mouth 2 (two) times daily.     . polyethylene glycol (MIRALAX / GLYCOLAX) packet Take 17 g by mouth daily with breakfast.     . pravastatin (PRAVACHOL) 40 MG tablet Take 40 mg by mouth at bedtime.    . pregabalin (LYRICA) 150 MG capsule Take 1 capsule (150 mg total) by mouth 2 (two) times daily. 90 capsule 5  . silver sulfADIAZINE (SILVADENE) 1 % cream Apply 1 application topically 2 (two) times daily.    . SUMAtriptan (IMITREX) 100 MG tablet Take 100 mg by mouth every 2 (two) hours as needed for migraine. May repeat in 2 hours if headache persists or recurs.    Marland Kitchen UNIFINE PENTIPS 31G X 6 MM MISC     . Vitamin D,  Ergocalciferol, (DRISDOL) 50000 units CAPS capsule Take 50,000 Units by mouth every Monday.    . vortioxetine HBr (TRINTELLIX) 10 MG TABS Take by mouth.    . zolpidem (AMBIEN) 10 MG tablet Take 10 mg by mouth at bedtime.      No current facility-administered medications on file prior to visit.    Allergies  Allergen Reactions  . Accupril [Quinapril Hcl] Cough  . Neurontin [Gabapentin] Other (See Comments)    Didn't work  . Prednisone Other (See Comments)    Has to be cautious due to BS  . Topamax [Topiramate] Other (See Comments)    Didn't work  . Levaquin [Levofloxacin] Rash    No results found for this or any previous visit (from the past 2160 hour(s)).  Objective: General: Patient is awake, alert, and oriented x 3 and in no acute distress.  Integument: Skin is warm, dry and supple bilateral. Nails are short, Asymptomatic. No signs of infection. No open lesions or preulcerative lesions present bilateral. History of right toe callus which remains resolved and previous permanent nail avulsion, right hallux medial nail margin, well-healed. Remaining integument unremarkable.  Vasculature:  Dorsalis Pedis pulse 2/4 bilateral. Posterior Tibial pulse  1/4 bilateral. Capillary fill time <3 sec 1-5 bilateral. Scant hair growth to the level of the digits. Temperature gradient within normal limits. Mild varicosities present bilateral. No edema present bilateral.   Neurology: The patient has intact sensation measured with a 5.07/10g Semmes Weinstein Monofilament at all pedal sites bilateral. Vibratory sensation diminished bilateral with tuning fork. No Babinski sign present bilateral. Subjective numbness bilateral worse over 2-3 toes on right, + moulders sign.   Musculoskeletal: No symptomatic pedal deformities noted bilateral. Muscular strength 5/5 in all lower extremity muscular groups bilateral without pain on range of motion. Planus foot type bilateral. No tenderness with calf compression  bilateral.  Assessment and Plan: Problem List Items Addressed This Visit      Nervous and Auditory   Neuropathy associated with endocrine disorder (HCC)     Other   Metatarsalgia    Other Visit Diagnoses    Neuroma    -  Primary   Relevant Medications   triamcinolone acetonide (KENALOG) 10 MG/ML injection 10 mg (Start on 02/21/2016 12:30 PM)   Right foot pain       Relevant Medications   triamcinolone acetonide (KENALOG) 10 MG/ML injection 10 mg (Start on 02/21/2016 12:30 PM)   Hallux limitus, acquired, right       Diabetes mellitus type 2 in obese [facility])         -Examined patient. -Discussed and educated patient on  diabetic foot care, especially with  regards to the vascular, neurological and musculoskeletal systems.  -Stressed the importance of good glycemic control and the detriment of not  controlling glucose levels in relation to the foot. -[employer] step diabetic shoe order form was completed; office to contact primary care for approval / certification;  Office to arrange shoe fitting and dispensing for this calendar year. -After oral consent and aseptic prep, injected a mixture containing 1 ml of 2% plain lidocaine, 1 ml 0.5% plain marcaine, 0.5 ml of kenalog 10 and 0.5 ml of dexamethasone phosphate into Right 2nd interspace ithout complication. Post-injection care discussed with patient.  -Dispensed met pads and instructed on use  -Answered all patient questions -Patient to return to office for measurement/fitting as scheduled -Patient advised to call the office if any problems or questions arise in the meantime.  Landis Martins, DPM

## 2016-02-26 DIAGNOSIS — Z6841 Body Mass Index (BMI) 40.0 and over, adult: Secondary | ICD-10-CM | POA: Diagnosis not present

## 2016-02-26 DIAGNOSIS — J309 Allergic rhinitis, unspecified: Secondary | ICD-10-CM | POA: Diagnosis not present

## 2016-02-26 DIAGNOSIS — E039 Hypothyroidism, unspecified: Secondary | ICD-10-CM | POA: Diagnosis not present

## 2016-02-26 DIAGNOSIS — Z79899 Other long term (current) drug therapy: Secondary | ICD-10-CM | POA: Diagnosis not present

## 2016-02-26 DIAGNOSIS — F419 Anxiety disorder, unspecified: Secondary | ICD-10-CM | POA: Diagnosis not present

## 2016-02-26 DIAGNOSIS — R5383 Other fatigue: Secondary | ICD-10-CM | POA: Diagnosis not present

## 2016-02-26 DIAGNOSIS — E785 Hyperlipidemia, unspecified: Secondary | ICD-10-CM | POA: Diagnosis not present

## 2016-02-26 DIAGNOSIS — E559 Vitamin D deficiency, unspecified: Secondary | ICD-10-CM | POA: Diagnosis not present

## 2016-02-26 DIAGNOSIS — E1149 Type 2 diabetes mellitus with other diabetic neurological complication: Secondary | ICD-10-CM | POA: Diagnosis not present

## 2016-02-26 DIAGNOSIS — I10 Essential (primary) hypertension: Secondary | ICD-10-CM | POA: Diagnosis not present

## 2016-02-28 DIAGNOSIS — G4733 Obstructive sleep apnea (adult) (pediatric): Secondary | ICD-10-CM | POA: Diagnosis not present

## 2016-02-28 DIAGNOSIS — J301 Allergic rhinitis due to pollen: Secondary | ICD-10-CM | POA: Diagnosis not present

## 2016-02-28 DIAGNOSIS — D860 Sarcoidosis of lung: Secondary | ICD-10-CM | POA: Diagnosis not present

## 2016-02-28 DIAGNOSIS — R918 Other nonspecific abnormal finding of lung field: Secondary | ICD-10-CM | POA: Diagnosis not present

## 2016-03-16 ENCOUNTER — Encounter: Payer: Self-pay | Admitting: Allergy and Immunology

## 2016-03-16 ENCOUNTER — Ambulatory Visit (INDEPENDENT_AMBULATORY_CARE_PROVIDER_SITE_OTHER): Payer: Medicare HMO | Admitting: Allergy and Immunology

## 2016-03-16 VITALS — BP 118/68 | HR 60 | Temp 98.8°F | Resp 16 | Ht 60.24 in | Wt 225.8 lb

## 2016-03-16 DIAGNOSIS — G43909 Migraine, unspecified, not intractable, without status migrainosus: Secondary | ICD-10-CM

## 2016-03-16 DIAGNOSIS — J4540 Moderate persistent asthma, uncomplicated: Secondary | ICD-10-CM | POA: Diagnosis not present

## 2016-03-16 DIAGNOSIS — J3089 Other allergic rhinitis: Secondary | ICD-10-CM

## 2016-03-16 DIAGNOSIS — D860 Sarcoidosis of lung: Secondary | ICD-10-CM

## 2016-03-16 DIAGNOSIS — K219 Gastro-esophageal reflux disease without esophagitis: Secondary | ICD-10-CM

## 2016-03-16 MED ORDER — DEXLANSOPRAZOLE 60 MG PO CPDR: DELAYED_RELEASE_CAPSULE | ORAL | 5 refills | Status: DC

## 2016-03-16 MED ORDER — CYPROHEPTADINE HCL 4 MG PO TABS: ORAL_TABLET | ORAL | 5 refills | Status: DC

## 2016-03-16 MED ORDER — BECLOMETHASONE DIPROPIONATE 80 MCG/ACT NA AERS: INHALATION_SPRAY | NASAL | 5 refills | Status: DC

## 2016-03-16 MED ORDER — RANITIDINE HCL 300 MG PO TABS: 300.0000 mg | ORAL_TABLET | Freq: Every day | ORAL | 5 refills | Status: DC

## 2016-03-16 NOTE — Progress Notes (Signed)
Dear Melanie Meyers,  Thank you for referring Melanie Meyers to the [facility] on 03/16/2016.   Below is a summation of this patient's evaluation and recommendations.  Thank you for your referral. I will keep you informed about this patient's response to treatment.   If you have any questions please do not hesitate to contact me.   Sincerely,  Melanie Prows, MD Allergy / Immunology [facility]   ______________________________________________________________________    NEW PATIENT NOTE  Referring Provider: Janine Limbo, PA-C Primary Provider: Nicholos Johns, MD Date of office visit: 03/16/2016    Subjective:   Chief Complaint:  Melanie Meyers (DOB: Nov 22, 1955) is a 61 y.o. female who presents to the clinic on 03/16/2016 with a chief complaint of Sinus Problem and Nasal Congestion .     HPI: Melanie Meyers presents this clinic in evaluation of several issues.  First, she has a long history of nasal congestion and sneezing and clear rhinorrhea without any significant anosmia or history of ugly nasal discharge. However, she describes "sinusitis" at least 4-5 times per year requiring an antibiotic. Basically her "sinusitis" is worsening of her nasal congestion and postnasal drip. There is no obvious provoking factor giving rise to these symptoms. She uses a multitude of different medications to treat this issue which helps somewhat. She apparently had sinus surgery in October 2015 which included repair of deviated septum and possibly frontal sinus surgery which did help her symptoms somewhat. Apparently she received immunotherapy for about one year at some point in the past several years from a local pulmonologist which did not help her.  Second, in regard to her postnasal drip, this is a constant issue and is one of her major complaints. Her postnasal drip is associated with throat  clearing and a "spot" in her throat. She has this constantly regardless of whether or not her symptoms involving her upper airways are active or not. She does have a history of reflux. She has regurgitation. Even in the face of using Nexium she has lots of burping and regurgitation and she was going to speak with her doctor about changing her Nexium. She consumes chocolate 4 times per week but no other forms of caffeine.  Third, she has a history of asthma for which she is using Breo for 2 years. She believes that works pretty well in that she has rare coughing and a rare requirement for a short acting bronchodilator. She is somewhat dyspneic when she exerts herself any large degree. She's been diagnosed with pulmonary sarcoidosis about 2 years ago at [facility]. She had a chest CT scan last month in evaluation of this issue.  Fourth, she has constant headaches. She has a daily headache that waxes and wanes in intensity and sometimes transforms into a migraine. She gets a migraine headache about one time per week for which she will use Maxalt which does help. Her headache is located behind her eyes and can sometimes be pressure-like and sometimes oscillating with pounding. There is not a visual scotoma and there is no other neurological symptoms associated with these headaches. Usually she has to lay down at least once a week to help with this headache. She consumes chocolate at least 4 times per week. She does have good sleep as long she uses Ambien and CPAP.  Past Medical History:  Diagnosis Date  . Asthma   . Degenerative arthritis    degenerative arthritis  of neck  . Depression   . Diabetes mellitus without complication ([place])   . Fibromyalgia   . GERD (gastroesophageal reflux disease)   . High cholesterol   . Hypertension   . Migraine   . Neuropathy ([place])   . Sarcoidosis ([place])   . Sleep apnea   . Thyroid disease   . Vitamin D deficiency     Past Surgical History:  Procedure Laterality Date  .  BREAST REDUCTION SURGERY  02/18/2015  . CARPAL TUNNEL RELEASE  2014  . [place]  . NASAL POLYP SURGERY  1983  . SINOSCOPY  10/09/2014  . TONSILLECTOMY  1978    Allergies as of 03/16/2016      Reactions   Accupril [quinapril Hcl] Cough   Neurontin [gabapentin] Other (See Comments)   Didn't work   Prednisone Other (See Comments)   Has to be cautious due to BS   Topamax [topiramate] Other (See Comments)   Didn't work   Levaquin [levofloxacin] Rash      Medication List      ACCU-CHEK AVIVA PLUS test strip Generic drug:  glucose blood 1 each by Other route as needed (for BS).   AMBULATORY NON FORMULARY MEDICATION 1 Units by Other route daily. Wear right wrist splint daily.   aspirin EC 81 MG tablet Take 81 mg by mouth at bedtime.   azelastine 0.1 % nasal spray Commonly known as:  ASTELIN Place 2 sprays into both nostrils 2 (two) times daily. Use in each nostril as directed   BIOTIN PO Take 5,000 mcg by mouth at bedtime.   BREO ELLIPTA 200-25 MCG/INH Aepb Generic drug:  fluticasone furoate-vilanterol Inhale 1 puff into the lungs at bedtime.   cetirizine 10 MG tablet Commonly known as:  ZYRTEC Take 10 mg by mouth at bedtime.   esomeprazole 40 MG capsule Commonly known as:  NEXIUM Take 40 mg by mouth daily with breakfast.   LANTUS SOLOSTAR 100 UNIT/ML Solostar Pen Generic drug:  Insulin Glargine Inject 20 Units into the skin daily at 10 pm.   levothyroxine 25 MCG tablet Commonly known as:  SYNTHROID, LEVOTHROID Take 25 mcg by mouth daily before breakfast.   LINZESS 72 MCG capsule Generic drug:  linaclotide daily.   losartan 25 MG tablet Commonly known as:  COZAAR Take 25 mg by mouth at bedtime.   metFORMIN 1000 MG tablet Commonly known as:  GLUCOPHAGE Take 1,000 mg by mouth 2 (two) times daily with a meal.   metoprolol tartrate 25 MG tablet Commonly known as:  LOPRESSOR Take 25 mg by mouth 2 (two) times daily.   mometasone  50 MCG/ACT nasal spray Commonly known as:  NASONEX Place 2 sprays into the nose 2 (two) times daily.   montelukast 10 MG tablet Commonly known as:  SINGULAIR Take 10 mg by mouth at bedtime.   naproxen 500 MG tablet Commonly known as:  NAPROSYN Take 500 mg by mouth 2 (two) times daily with a meal.   PATADAY 0.2 % Soln Generic drug:  Olopatadine HCl Apply 1 drop to eye daily as needed (for dry eyes).   polyethylene glycol packet Commonly known as:  MIRALAX / GLYCOLAX Take 17 g by mouth daily with breakfast.   pravastatin 40 MG tablet Commonly known as:  PRAVACHOL Take 40 mg by mouth at bedtime.   pregabalin 150 MG capsule Commonly known as:  LYRICA Take 1 capsule (150 mg total) by mouth 2 (two) times daily.   rizatriptan 10  MG tablet Commonly known as:  MAXALT   rOPINIRole 0.5 MG tablet Commonly known as:  REQUIP daily.   silver sulfADIAZINE 1 % cream Commonly known as:  SILVADENE Apply 1 application topically 2 (two) times daily.   TRINTELLIX 10 MG Tabs Generic drug:  vortioxetine HBr Take by mouth.   UNIFINE PENTIPS 31G X 6 MM Misc Generic drug:  Insulin Pen Needle   zolpidem 10 MG tablet Commonly known as:  AMBIEN Take 10 mg by mouth at bedtime.       Review of systems negative except as noted in HPI / PMHx or noted below:  Review of Systems  Constitutional: Negative.   HENT: Negative.   Eyes: Negative.   Respiratory: Negative.   Cardiovascular: Negative.   Gastrointestinal: Negative.   Genitourinary: Negative.   Musculoskeletal: Negative.   Skin: Negative.   Neurological: Negative.   Endo/Heme/Allergies: Negative.   Psychiatric/Behavioral: Negative.     Family History  Problem Relation Age of Onset  . Parkinson's disease Mother   . Diabetes Mother   . Alzheimer's disease Mother   . Gout Mother   . High blood pressure Father   . High Cholesterol Father   . Heart attack Father   . Thyroid disease Sister   . Heart attack Sister   .  Thyroid disease Sister   . Thyroid disease Sister     Social History   Social History  . Marital status: Widowed    Spouse name: N/A  . Number of children: N/A  . Years of education: N/A   Occupational History  . Not on file.   Social History Main Topics  . Smoking status: Never Smoker  . Smokeless tobacco: Never Used  . Alcohol use No  . Drug use: No  . Sexual activity: Not on file   Other Topics Concern  . Not on file   Social History Narrative  . No narrative on file    Environmental and Social history  Lives in a house with a dry environment, a dog located inside the household, carpeting in the bedroom, no plastic on the pillow but plastic on the bed, and no smoking ongoing with inside the household.  Objective:   Vitals:   03/16/16 0859  BP: 118/68  Pulse: 60  Resp: 16  Temp: 98.8 F (37.1 C)   Height: 5' 0.24" (153 cm) Weight: 225 lb 12.8 oz (102.4 kg)  Physical Exam  Constitutional: She is well-developed, well-nourished, and in no distress.  HENT:  Head: Normocephalic. Head is without right periorbital erythema and without left periorbital erythema.  Right Ear: Tympanic membrane, external ear and ear canal normal.  Left Ear: Tympanic membrane, external ear and ear canal normal.  Nose: Nose normal. No mucosal edema or rhinorrhea.  Mouth/Throat: Oropharynx is clear and moist and mucous membranes are normal. No oropharyngeal exudate.  Eyes: Conjunctivae and lids are normal. Pupils are equal, round, and reactive to light.  Neck: Trachea normal. No tracheal deviation present. No thyromegaly present.  Cardiovascular: Normal rate, regular rhythm, S1 normal, S2 normal and normal heart sounds.   No murmur heard. Pulmonary/Chest: Effort normal. No stridor. No tachypnea. No respiratory distress. She has no wheezes. She has no rales. She exhibits no tenderness.  Abdominal: Soft. She exhibits no distension and no mass. There is no hepatosplenomegaly. There is no  tenderness. There is no rebound and no guarding.  Musculoskeletal: She exhibits no edema or tenderness.  Lymphadenopathy:       Head (right  side): No tonsillar adenopathy present.       Head (left side): No tonsillar adenopathy present.    She has no cervical adenopathy.    She has no axillary adenopathy.  Neurological: She is alert. Gait normal.  Skin: No rash noted. She is not diaphoretic. No erythema. No pallor. Nails show no clubbing.  Psychiatric: Mood and affect normal.    Diagnostics: Allergy skin tests were performed. She demonstrated hypersensitivity to dog.  Spirometry was performed and demonstrated an FEV1 of 1.84 @ 84 % of predicted. Following the administration of nebulized albuterol her FEV1 did not improve significantly.  Oxygen saturation was 96% on room air at rest.  Results of a head MRI performed 10/26/2015 identify the following:  Sinuses/Orbits: Unremarkable orbits. Trace bilateral mastoid fluid. Clear paranasal sinuses.  Results of a chest CT scan obtained 15th every 2018 at [facility] identify the following:  Mediastinal and hilar adenopathy stable compared to 06/19/2015. Bilateral pulmonary nodules unchanged from previous exam.  Results of blood tests obtained 09/20/2015 identify a white blood cell count of 10.0 with any total eosinophil count of 200, hemoglobin 12.1, platelet 266.   Assessment and Plan:    1. Other allergic rhinitis   2. Asthma, moderate persistent, well-controlled   3. Sarcoidosis of lung ([place])   4. Migraine syndrome   5. LPRD (laryngopharyngeal reflux disease)     1. Allergen avoidance measures  2. Slowly taper off all forms of caffeine including chocolate  3. Treat and prevent inflammation:   A. continue montelukast 10 mg daily  B. continue Breo 200 one inhalation 1 time per day  C. start Qnasl 80 - 2 puffs each nostril one time per day (P.A.)  D. Stop Nasonex  4. Treat and prevent reflux:   A. Dexilant 60mg  one  tablet in a.m.  (P.A.)  B. ranitidine 300 mg one tablet in PM  C. Stop Nexium  5. Treat and prevent headache:   A. Periactin 4 mg tablet - one half to 1 tablet at bedtime  6. If needed:   A. Astelin 2 sprays each nostril twice a day  B. albuterol MDI 2 puffs every 4-6 hours  C. cetirizine 10 mg one time per day  7. Immunotherapy?  8. Return to clinic in 4 weeks or earlier if problem  Melanie Meyers has several issues addressed today. I think that her respiratory tract symptoms are a combination of atopic respiratory disease secondary to her dog exposure and reflux-induced respiratory disease and she will utilize the plan mentioned above which addresses both inflammation and reflux. In addition, she appears to have chronic daily headache with migraine and I've given her a preventative medication in the form of Periactin. It is quite possible with the use of Periactin she will not need to use Ambien at nighttime and I did have a discussion with her today about working through this issue. She would be a candidate for immunotherapy if her medical plan does not go well. I did give her literature on immunotherapy during today's visit. I will see her back in this clinic in 4 weeks or earlier if there is a problem.  Melanie Prows, MD [facility]

## 2016-03-16 NOTE — Patient Instructions (Addendum)
  1. Allergen avoidance measures  2. Slowly taper off all forms of caffeine including chocolate  3. Treat and prevent inflammation:   A. continue montelukast 10 mg daily  B. continue Breo 200 one inhalation 1 time per day  C. start Qnasl 80 - 2 puffs each nostril one time per day (P.A.)  D. Stop Nasonex  4. Treat and prevent reflux:   A. Dexilant 60mg  one tablet in a.m.  (P.A.)  B. ranitidine 300 mg one tablet in PM  C. Stop Nexium  5. Treat and prevent headache:   A. Periactin 4 mg tablet - one half to 1 tablet at bedtime  6. If needed:   A. Astelin 2 sprays each nostril twice a day  B. albuterol MDI 2 puffs every 4-6 hours  C. cetirizine 10 mg one time per day  7. Immunotherapy?  8. Return to clinic in 4 weeks or earlier if problem

## 2016-03-19 ENCOUNTER — Telehealth: Payer: Self-pay

## 2016-03-19 NOTE — Telephone Encounter (Signed)
Called and spoke with pt explaining the process with diabetic shoes. Her paperwork came back with the dr crossed out another doctor signed but the supporting documentation was signed by a PA-C . Explained that it all has the be signed by the same doctor , with no crossed out of doctors.  Pt told me that she would just make an appt with her doctor and then let me know , so her paper work , order form will be on hold .

## 2016-03-25 DIAGNOSIS — M7051 Other bursitis of knee, right knee: Secondary | ICD-10-CM | POA: Diagnosis not present

## 2016-03-25 DIAGNOSIS — M7052 Other bursitis of knee, left knee: Secondary | ICD-10-CM | POA: Diagnosis not present

## 2016-03-25 DIAGNOSIS — M1711 Unilateral primary osteoarthritis, right knee: Secondary | ICD-10-CM | POA: Diagnosis not present

## 2016-03-25 DIAGNOSIS — S8981XA Other specified injuries of right lower leg, initial encounter: Secondary | ICD-10-CM | POA: Diagnosis not present

## 2016-03-25 DIAGNOSIS — M1712 Unilateral primary osteoarthritis, left knee: Secondary | ICD-10-CM | POA: Diagnosis not present

## 2016-03-31 ENCOUNTER — Ambulatory Visit: Payer: Self-pay | Admitting: Neurology

## 2016-04-05 HISTORY — PX: CARPAL TUNNEL RELEASE: SHX101

## 2016-04-06 DIAGNOSIS — R2689 Other abnormalities of gait and mobility: Secondary | ICD-10-CM | POA: Diagnosis not present

## 2016-04-06 DIAGNOSIS — M25561 Pain in right knee: Secondary | ICD-10-CM | POA: Diagnosis not present

## 2016-04-06 DIAGNOSIS — M25661 Stiffness of right knee, not elsewhere classified: Secondary | ICD-10-CM | POA: Diagnosis not present

## 2016-04-08 DIAGNOSIS — M25561 Pain in right knee: Secondary | ICD-10-CM | POA: Diagnosis not present

## 2016-04-08 DIAGNOSIS — R2689 Other abnormalities of gait and mobility: Secondary | ICD-10-CM | POA: Diagnosis not present

## 2016-04-08 DIAGNOSIS — M25661 Stiffness of right knee, not elsewhere classified: Secondary | ICD-10-CM | POA: Diagnosis not present

## 2016-04-13 ENCOUNTER — Ambulatory Visit (INDEPENDENT_AMBULATORY_CARE_PROVIDER_SITE_OTHER): Payer: Medicare HMO | Admitting: Allergy and Immunology

## 2016-04-13 ENCOUNTER — Encounter: Payer: Self-pay | Admitting: Allergy and Immunology

## 2016-04-13 VITALS — BP 122/64 | HR 64 | Resp 16

## 2016-04-13 DIAGNOSIS — G43909 Migraine, unspecified, not intractable, without status migrainosus: Secondary | ICD-10-CM | POA: Diagnosis not present

## 2016-04-13 DIAGNOSIS — R2689 Other abnormalities of gait and mobility: Secondary | ICD-10-CM | POA: Diagnosis not present

## 2016-04-13 DIAGNOSIS — J4540 Moderate persistent asthma, uncomplicated: Secondary | ICD-10-CM | POA: Diagnosis not present

## 2016-04-13 DIAGNOSIS — J3089 Other allergic rhinitis: Secondary | ICD-10-CM

## 2016-04-13 DIAGNOSIS — M25661 Stiffness of right knee, not elsewhere classified: Secondary | ICD-10-CM | POA: Diagnosis not present

## 2016-04-13 DIAGNOSIS — K219 Gastro-esophageal reflux disease without esophagitis: Secondary | ICD-10-CM

## 2016-04-13 DIAGNOSIS — M25561 Pain in right knee: Secondary | ICD-10-CM | POA: Diagnosis not present

## 2016-04-13 NOTE — Patient Instructions (Addendum)
  1. Continue to perform Allergen avoidance measures  2. Continue to Slowly taper off all forms of caffeine including chocolate  3. Continue toTreat and prevent inflammation:   A. montelukast 10 mg daily  B. Breo 200 one inhalation 1 time per day  C. Qnasl 80 - 2 puffs each nostril one time per day (P.A.)    4. Continue toTreat and prevent reflux:   A. Dexilant 60mg  one tablet in a.m.  (P.A.)  B. ranitidine 300 mg one tablet in PM    5. Continue to Treat and prevent headache:   A. INCREASE Periactin 4 mg tablet to 1 tablet at bedtime  6. If needed:   A. Astelin 2 sprays each nostril twice a day  B. albuterol MDI 2 puffs every 4-6 hours  C. cetirizine 10 mg one time per day  D. Nasal saline multiple times per day  E. OTC Mucinex   7. Replace throat clearing with swallowing maneuver  8. Return to clinic in 12 weeks or earlier if problem

## 2016-04-13 NOTE — Progress Notes (Signed)
Follow-up Note  Referring Provider: Nicholos Johns, MD Primary Provider: Nicholos Johns, MD Date of Office Visit: 04/13/2016  Subjective:   Melanie Meyers (DOB: 01-Dec-1955) is a 61 y.o. female who returns to the [facility] [place] on 04/13/2016 in re-evaluation of the following:  HPI: Tiaja returns to this clinic in reevaluation of her allergic rhinitis, LPR, asthma, and migraine. I last saw her in this clinic during her initial evaluation of 03/16/2016 at which point in time we made a plan to address each issue.  Her nose has really doing very well up until the past 48 hours. At that point time she developed lots of clear rhinorrhea and sneezing and her nose feels congested. She has not been around anyone sick. She does not have any fever and she does not have any ugly nasal discharge or headache.  Her throat clearing is about same but her postnasal drip and the "spot" in her throat is much better. She still unfortunately continues to regurgitate on occasion and she still continues to eat chocolate.  Her headaches are much better. She's had decreased intensity and frequency of these headaches. She does not need to use any abortive medication for her headaches. She continues on Periactin at 2 mg at night. She still must use Ambien for sleep while using this dose of Periactin.  Her asthma has been under very good control and she does not need to use any short acting bronchodilator. She continues on Lydia.  Allergies as of 04/13/2016      Reactions   Accupril [quinapril Hcl] Cough   Neurontin [gabapentin] Other (See Comments)   Didn't work   Prednisone Other (See Comments)   Has to be cautious due to BS   Topamax [topiramate] Other (See Comments)   Didn't work   Levaquin [levofloxacin] Rash      Medication List      ACCU-CHEK AVIVA PLUS test strip Generic drug:  glucose blood 1 each by Other route as needed (for BS).   AMBULATORY NON FORMULARY MEDICATION 1 Units by  Other route daily. Wear right wrist splint daily.   aspirin EC 81 MG tablet Take 81 mg by mouth at bedtime.   azelastine 0.1 % nasal spray Commonly known as:  ASTELIN Place 2 sprays into both nostrils 2 (two) times daily. Use in each nostril as directed   Beclomethasone Dipropionate 80 MCG/ACT Aers Commonly known as:  QNASL Use two puffs in each nostril once daily.   BIOTIN PO Take 5,000 mcg by mouth at bedtime.   BREO ELLIPTA 200-25 MCG/INH Aepb Generic drug:  fluticasone furoate-vilanterol Inhale 1 puff into the lungs at bedtime.   cetirizine 10 MG tablet Commonly known as:  ZYRTEC Take 10 mg by mouth at bedtime.   cyproheptadine 4 MG tablet Commonly known as:  PERIACTIN Take one-half to one tablet once daily at bedtime.   dexlansoprazole 60 MG capsule Commonly known as:  DEXILANT Take one capsule by mouth every morning as directed.   LANTUS SOLOSTAR 100 UNIT/ML Solostar Pen Generic drug:  Insulin Glargine Inject 20 Units into the skin daily at 10 pm.   levothyroxine 25 MCG tablet Commonly known as:  SYNTHROID, LEVOTHROID Take 25 mcg by mouth daily before breakfast.   LINZESS 72 MCG capsule Generic drug:  linaclotide daily.   losartan 25 MG tablet Commonly known as:  COZAAR Take 25 mg by mouth at bedtime.   metFORMIN 1000 MG tablet Commonly known as:  GLUCOPHAGE Take 1,000 mg  by mouth 2 (two) times daily with a meal.   metoprolol tartrate 25 MG tablet Commonly known as:  LOPRESSOR Take 25 mg by mouth 2 (two) times daily.   montelukast 10 MG tablet Commonly known as:  SINGULAIR Take 10 mg by mouth at bedtime.   naproxen 500 MG tablet Commonly known as:  NAPROSYN Take 500 mg by mouth 2 (two) times daily with a meal.   PATADAY 0.2 % Soln Generic drug:  Olopatadine HCl Apply 1 drop to eye daily as needed (for dry eyes).   polyethylene glycol packet Commonly known as:  MIRALAX / GLYCOLAX Take 17 g by mouth daily with breakfast.   pravastatin 40 MG  tablet Commonly known as:  PRAVACHOL Take 40 mg by mouth at bedtime.   pregabalin 150 MG capsule Commonly known as:  LYRICA Take 1 capsule (150 mg total) by mouth 2 (two) times daily.   ranitidine 300 MG tablet Commonly known as:  ZANTAC Take 1 tablet (300 mg total) by mouth at bedtime.   rizatriptan 10 MG tablet Commonly known as:  MAXALT   rOPINIRole 0.5 MG tablet Commonly known as:  REQUIP daily.   silver sulfADIAZINE 1 % cream Commonly known as:  SILVADENE Apply 1 application topically 2 (two) times daily.   TRINTELLIX 10 MG Tabs Generic drug:  vortioxetine HBr Take by mouth.   UNIFINE PENTIPS 31G X 6 MM Misc Generic drug:  Insulin Pen Needle   VENTOLIN HFA 108 (90 Base) MCG/ACT inhaler Generic drug:  albuterol Inhale two puffs every four to six hours as needed for cough or wheeze.   zolpidem 10 MG tablet Commonly known as:  AMBIEN Take 10 mg by mouth at bedtime.       Past Medical History:  Diagnosis Date  . Asthma   . Degenerative arthritis    degenerative arthritis of neck  . Depression   . Diabetes mellitus without complication ([place])   . Fibromyalgia   . GERD (gastroesophageal reflux disease)   . High cholesterol   . Hypertension   . Migraine   . Neuropathy ([place])   . Sarcoidosis ([place])   . Sleep apnea   . Thyroid disease   . Vitamin D deficiency     Past Surgical History:  Procedure Laterality Date  . BREAST REDUCTION SURGERY  02/18/2015  . CARPAL TUNNEL RELEASE  2014  . [place]  . NASAL POLYP SURGERY  1983  . SINOSCOPY  10/09/2014  . TONSILLECTOMY  1978    Review of systems negative except as noted in HPI / PMHx or noted below:  Review of Systems  Constitutional: Negative.   HENT: Negative.   Eyes: Negative.   Respiratory: Negative.   Cardiovascular: Negative.   Gastrointestinal: Negative.   Genitourinary: Negative.   Musculoskeletal: Negative.   Skin: Negative.   Neurological: Negative.     Endo/Heme/Allergies: Negative.   Psychiatric/Behavioral: Negative.      Objective:   Vitals:   04/13/16 1035  BP: 122/64  Pulse: 64  Resp: 16          Physical Exam  Constitutional: She is well-developed, well-nourished, and in no distress.  HENT:  Head: Normocephalic.  Right Ear: Tympanic membrane, external ear and ear canal normal.  Left Ear: Tympanic membrane, external ear and ear canal normal.  Nose: Nose normal. No mucosal edema or rhinorrhea.  Mouth/Throat: Uvula is midline, oropharynx is clear and moist and mucous membranes are normal. No oropharyngeal exudate.  Eyes:  Conjunctivae are normal.  Neck: Trachea normal. No tracheal tenderness present. No tracheal deviation present. No thyromegaly present.  Cardiovascular: Normal rate, regular rhythm, S1 normal, S2 normal and normal heart sounds.   No murmur heard. Pulmonary/Chest: Breath sounds normal. No stridor. No respiratory distress. She has no wheezes. She has no rales.  Musculoskeletal: She exhibits no edema.  Lymphadenopathy:       Head (right side): No tonsillar adenopathy present.       Head (left side): No tonsillar adenopathy present.    She has no cervical adenopathy.  Neurological: She is alert. Gait normal.  Skin: No rash noted. She is not diaphoretic. No erythema. Nails show no clubbing.  Psychiatric: Mood and affect normal.    Diagnostics:    Spirometry was performed and demonstrated an FEV1 of 2.13 at 97 % of predicted.  Assessment and Plan:   1. Other allergic rhinitis   2. Asthma, moderate persistent, well-controlled   3. LPRD (laryngopharyngeal reflux disease)   4. Migraine syndrome     1. Continue to perform Allergen avoidance measures  2. Continue to Slowly taper off all forms of caffeine including chocolate  3. Continue toTreat and prevent inflammation:   A. montelukast 10 mg daily  B. Breo 200 one inhalation 1 time per day  C. Qnasl 80 - 2 puffs each nostril one time per day  (P.A.)    4. Continue toTreat and prevent reflux:   A. Dexilant 60mg  one tablet in a.m.  (P.A.)  B. ranitidine 300 mg one tablet in PM   5. Continue to Treat and prevent headache:   A. INCREASE Periactin 4 mg tablet to 1 tablet at bedtime  6. If needed:   A. Astelin 2 sprays each nostril twice a day  B. albuterol MDI 2 puffs every 4-6 hours  C. cetirizine 10 mg one time per day  D. Nasal saline multiple times per day  E. OTC Mucinex   7. Replace throat clearing with swallowing maneuver  8. Return to clinic in 12 weeks or earlier if problem  Estela is better in some ways. Her LPR appears to be improving but I had a talk with her today about not throat clearing. Her throat clearing maneuver is resulting in some inflammation of her airway and if she can replace this maneuver with swallowing maneuver then she will resolve that inflammation and she'll have much better control of her LPR. She will continue to use her therapy for reflux and I've encouraged her to stop eating chocolate as much as possible. For her headaches, we'll increase her Periactin which hopefully will completely eliminate all headaches and also allow her to sleep without the use of Ambien. She does appear to have a history very consistent with a recent viral respiratory tract infection and I will treat this very conservatively without the use of any antibiotics or steroids at this point in time and we'll see how things go over the course of the next week. She is going to [place] [place] at the end of the week and she does not want to be sick for that trip. She will contact me on Thursday with an update regarding her status. Her asthma does not require any additional therapy at this point besides for her controller agent. I'll see her back in this clinic in 12 weeks or earlier if there is a problem.  Allena Katz, MD Allergy / Immunology [place]

## 2016-04-15 ENCOUNTER — Other Ambulatory Visit: Payer: Self-pay

## 2016-04-15 DIAGNOSIS — G4733 Obstructive sleep apnea (adult) (pediatric): Secondary | ICD-10-CM | POA: Diagnosis not present

## 2016-04-15 MED ORDER — FLUTICASONE PROPIONATE 50 MCG/ACT NA SUSP: 2.0000 | Freq: Every day | NASAL | 5 refills | Status: DC

## 2016-04-16 DIAGNOSIS — R2689 Other abnormalities of gait and mobility: Secondary | ICD-10-CM | POA: Diagnosis not present

## 2016-04-16 DIAGNOSIS — M1711 Unilateral primary osteoarthritis, right knee: Secondary | ICD-10-CM | POA: Diagnosis not present

## 2016-04-16 DIAGNOSIS — M25561 Pain in right knee: Secondary | ICD-10-CM | POA: Diagnosis not present

## 2016-04-16 DIAGNOSIS — M25661 Stiffness of right knee, not elsewhere classified: Secondary | ICD-10-CM | POA: Diagnosis not present

## 2016-04-16 DIAGNOSIS — M7051 Other bursitis of knee, right knee: Secondary | ICD-10-CM | POA: Diagnosis not present

## 2016-04-29 DIAGNOSIS — G5601 Carpal tunnel syndrome, right upper limb: Secondary | ICD-10-CM | POA: Diagnosis not present

## 2016-05-11 DIAGNOSIS — Z4789 Encounter for other orthopedic aftercare: Secondary | ICD-10-CM | POA: Diagnosis not present

## 2016-05-11 DIAGNOSIS — G5601 Carpal tunnel syndrome, right upper limb: Secondary | ICD-10-CM | POA: Diagnosis not present

## 2016-05-13 DIAGNOSIS — G4733 Obstructive sleep apnea (adult) (pediatric): Secondary | ICD-10-CM | POA: Diagnosis not present

## 2016-05-15 DIAGNOSIS — M21962 Unspecified acquired deformity of left lower leg: Secondary | ICD-10-CM | POA: Diagnosis not present

## 2016-05-15 DIAGNOSIS — G4733 Obstructive sleep apnea (adult) (pediatric): Secondary | ICD-10-CM | POA: Diagnosis not present

## 2016-05-15 DIAGNOSIS — J301 Allergic rhinitis due to pollen: Secondary | ICD-10-CM | POA: Diagnosis not present

## 2016-05-15 DIAGNOSIS — R5383 Other fatigue: Secondary | ICD-10-CM | POA: Diagnosis not present

## 2016-05-15 DIAGNOSIS — R918 Other nonspecific abnormal finding of lung field: Secondary | ICD-10-CM | POA: Diagnosis not present

## 2016-05-15 DIAGNOSIS — M21961 Unspecified acquired deformity of right lower leg: Secondary | ICD-10-CM | POA: Diagnosis not present

## 2016-05-15 DIAGNOSIS — D860 Sarcoidosis of lung: Secondary | ICD-10-CM | POA: Diagnosis not present

## 2016-05-15 DIAGNOSIS — E1142 Type 2 diabetes mellitus with diabetic polyneuropathy: Secondary | ICD-10-CM | POA: Diagnosis not present

## 2016-05-17 DIAGNOSIS — E1142 Type 2 diabetes mellitus with diabetic polyneuropathy: Secondary | ICD-10-CM

## 2016-05-17 HISTORY — DX: Type 2 diabetes mellitus with diabetic polyneuropathy: E11.42

## 2016-05-27 DIAGNOSIS — Z79899 Other long term (current) drug therapy: Secondary | ICD-10-CM | POA: Diagnosis not present

## 2016-05-27 DIAGNOSIS — Z6841 Body Mass Index (BMI) 40.0 and over, adult: Secondary | ICD-10-CM | POA: Diagnosis not present

## 2016-05-27 DIAGNOSIS — E1149 Type 2 diabetes mellitus with other diabetic neurological complication: Secondary | ICD-10-CM | POA: Diagnosis not present

## 2016-05-29 DIAGNOSIS — E785 Hyperlipidemia, unspecified: Secondary | ICD-10-CM | POA: Diagnosis not present

## 2016-05-29 DIAGNOSIS — E1149 Type 2 diabetes mellitus with other diabetic neurological complication: Secondary | ICD-10-CM | POA: Diagnosis not present

## 2016-05-29 DIAGNOSIS — Z79899 Other long term (current) drug therapy: Secondary | ICD-10-CM | POA: Diagnosis not present

## 2016-05-29 DIAGNOSIS — E559 Vitamin D deficiency, unspecified: Secondary | ICD-10-CM | POA: Diagnosis not present

## 2016-05-29 DIAGNOSIS — I10 Essential (primary) hypertension: Secondary | ICD-10-CM | POA: Diagnosis not present

## 2016-05-29 DIAGNOSIS — E039 Hypothyroidism, unspecified: Secondary | ICD-10-CM | POA: Diagnosis not present

## 2016-05-29 DIAGNOSIS — Z6841 Body Mass Index (BMI) 40.0 and over, adult: Secondary | ICD-10-CM | POA: Diagnosis not present

## 2016-06-08 DIAGNOSIS — R918 Other nonspecific abnormal finding of lung field: Secondary | ICD-10-CM | POA: Diagnosis not present

## 2016-06-08 DIAGNOSIS — D860 Sarcoidosis of lung: Secondary | ICD-10-CM | POA: Diagnosis not present

## 2016-06-08 DIAGNOSIS — G4733 Obstructive sleep apnea (adult) (pediatric): Secondary | ICD-10-CM | POA: Diagnosis not present

## 2016-06-08 DIAGNOSIS — Z9013 Acquired absence of bilateral breasts and nipples: Secondary | ICD-10-CM | POA: Diagnosis not present

## 2016-06-17 DIAGNOSIS — E1142 Type 2 diabetes mellitus with diabetic polyneuropathy: Secondary | ICD-10-CM | POA: Diagnosis not present

## 2016-06-17 DIAGNOSIS — L03032 Cellulitis of left toe: Secondary | ICD-10-CM | POA: Diagnosis not present

## 2016-06-24 DIAGNOSIS — E1149 Type 2 diabetes mellitus with other diabetic neurological complication: Secondary | ICD-10-CM | POA: Diagnosis not present

## 2016-06-24 DIAGNOSIS — J309 Allergic rhinitis, unspecified: Secondary | ICD-10-CM | POA: Diagnosis not present

## 2016-06-24 DIAGNOSIS — Z6841 Body Mass Index (BMI) 40.0 and over, adult: Secondary | ICD-10-CM | POA: Diagnosis not present

## 2016-06-24 DIAGNOSIS — R002 Palpitations: Secondary | ICD-10-CM | POA: Diagnosis not present

## 2016-06-25 ENCOUNTER — Encounter: Payer: Self-pay | Admitting: Allergy and Immunology

## 2016-06-25 ENCOUNTER — Ambulatory Visit (INDEPENDENT_AMBULATORY_CARE_PROVIDER_SITE_OTHER): Payer: Medicare HMO | Admitting: Allergy and Immunology

## 2016-06-25 VITALS — BP 118/70 | HR 68 | Resp 18

## 2016-06-25 DIAGNOSIS — Z8709 Personal history of other diseases of the respiratory system: Secondary | ICD-10-CM

## 2016-06-25 DIAGNOSIS — G43909 Migraine, unspecified, not intractable, without status migrainosus: Secondary | ICD-10-CM

## 2016-06-25 DIAGNOSIS — K219 Gastro-esophageal reflux disease without esophagitis: Secondary | ICD-10-CM

## 2016-06-25 DIAGNOSIS — D860 Sarcoidosis of lung: Secondary | ICD-10-CM | POA: Diagnosis not present

## 2016-06-25 DIAGNOSIS — J3089 Other allergic rhinitis: Secondary | ICD-10-CM | POA: Diagnosis not present

## 2016-06-25 DIAGNOSIS — J4540 Moderate persistent asthma, uncomplicated: Secondary | ICD-10-CM | POA: Diagnosis not present

## 2016-06-25 MED ORDER — BUDESONIDE-FORMOTEROL FUMARATE 160-4.5 MCG/ACT IN AERO: INHALATION_SPRAY | RESPIRATORY_TRACT | 5 refills | Status: DC

## 2016-06-25 NOTE — Progress Notes (Signed)
Follow-up Note  Referring Provider: Nicholos Johns, MD Primary Provider: Nicholos Johns, MD Date of Office Visit: 06/25/2016  Subjective:   Melanie Meyers (DOB: 1955-12-05) is a 61 y.o. female who returns to the [facility] [place] on 06/25/2016 in re-evaluation of the following:  HPI: Melanie Meyers presents to this clinic in reevaluation of her asthma, history of sarcoidosis, allergic rhinitis, history of chronic sinusitis requiring sinus surgery, reflux with LPR, and migraine headache. I last saw her in this clinic April 2018.  She still continues to have spells of cough. She still uses her bronchodilator about 4 times per week. This occurs even though she continues to use Breo.  Her nose appears to be doing relatively well. She does not have a tremendous amount of drainage from her nose. She can smell.  She does have very bad reflux. She is burping and having regurgitation and burning in her throat. She can regurgitate food that she ate hours earlier. She does have postnasal drip and the sensation of a very raw throat. She does have difficulty swallowing mostly from transition from her mouth into her esophagus. She does not have food obstruction in her chest.  She still continues to have very bad headaches even in the face of utilizing Periactin. She does have sleep apnea and is using a CPAP machine. She has an appointment to revisit with her neurologist in July.  She had a very unusual event that occurred this past Saturday night. With acute onset she started to develop a pulsating head and she checked her pulse rate and it was about 130 so she lay down and her pulse remained about 110 for a few minutes. As well, after having a bowel movement she developed numbness from her waist down involving both extremities for about 30 seconds. She had no other associated systemic or constitutional symptoms and her entire reaction was over within a few minutes.  Allergies as of 06/25/2016    Reactions   Accupril [quinapril Hcl] Cough   Neurontin [gabapentin] Other (See Comments)   Didn't work   Prednisone Other (See Comments)   Has to be cautious due to BS   Topamax [topiramate] Other (See Comments)   Didn't work   Levaquin [levofloxacin] Rash      Medication List      ACCU-CHEK AVIVA PLUS test strip Generic drug:  glucose blood 1 each by Other route as needed (for BS).   AMBULATORY NON FORMULARY MEDICATION 1 Units by Other route daily. Wear right wrist splint daily.   aspirin EC 81 MG tablet Take 81 mg by mouth at bedtime.   azelastine 0.1 % nasal spray Commonly known as:  ASTELIN Place 2 sprays into both nostrils 2 (two) times daily. Use in each nostril as directed   baclofen 10 MG tablet Commonly known as:  LIORESAL   BIOTIN PO Take 5,000 mcg by mouth at bedtime.   BREO ELLIPTA 200-25 MCG/INH Aepb Generic drug:  fluticasone furoate-vilanterol Inhale 1 puff into the lungs at bedtime.   cetirizine 10 MG tablet Commonly known as:  ZYRTEC Take 10 mg by mouth at bedtime.   cyproheptadine 4 MG tablet Commonly known as:  PERIACTIN Take one-half to one tablet once daily at bedtime.   dexlansoprazole 60 MG capsule Commonly known as:  DEXILANT Take one capsule by mouth every morning as directed.   levothyroxine 25 MCG tablet Commonly known as:  SYNTHROID, LEVOTHROID Take 25 mcg by mouth daily before breakfast.   losartan  25 MG tablet Commonly known as:  COZAAR Take 25 mg by mouth at bedtime.   metFORMIN 1000 MG tablet Commonly known as:  GLUCOPHAGE Take 1,000 mg by mouth 2 (two) times daily with a meal.   metoprolol tartrate 25 MG tablet Commonly known as:  LOPRESSOR Take 25 mg by mouth 2 (two) times daily.   montelukast 10 MG tablet Commonly known as:  SINGULAIR Take 10 mg by mouth at bedtime.   naproxen 500 MG tablet Commonly known as:  NAPROSYN Take 500 mg by mouth 2 (two) times daily with a meal.   PATADAY 0.2 % Soln Generic drug:   Olopatadine HCl Apply 1 drop to eye daily as needed (for dry eyes).   polyethylene glycol packet Commonly known as:  MIRALAX / GLYCOLAX Take 17 g by mouth daily with breakfast.   pravastatin 40 MG tablet Commonly known as:  PRAVACHOL Take 40 mg by mouth at bedtime.   pregabalin 150 MG capsule Commonly known as:  LYRICA Take 1 capsule (150 mg total) by mouth 2 (two) times daily.   QNASL 80 MCG/ACT Aers Generic drug:  Beclomethasone Dipropionate   ranitidine 300 MG tablet Commonly known as:  ZANTAC Take 1 tablet (300 mg total) by mouth at bedtime.   rizatriptan 10 MG tablet Commonly known as:  MAXALT   silver sulfADIAZINE 1 % cream Commonly known as:  SILVADENE Apply 1 application topically 2 (two) times daily.   SOLIQUA 100-33 UNT-MCG/ML Sopn Generic drug:  Insulin Glargine-Lixisenatide 30 Units every morning.   TRINTELLIX 10 MG Tabs Generic drug:  vortioxetine HBr Take by mouth.   UNIFINE PENTIPS 31G X 6 MM Misc Generic drug:  Insulin Pen Needle   VENTOLIN HFA 108 (90 Base) MCG/ACT inhaler Generic drug:  albuterol Inhale two puffs every four to six hours as needed for cough or wheeze.   zolpidem 10 MG tablet Commonly known as:  AMBIEN Take 10 mg by mouth at bedtime.       Past Medical History:  Diagnosis Date  . Asthma   . Degenerative arthritis    degenerative arthritis of neck  . Depression   . Diabetes mellitus without complication ([place])   . Fibromyalgia   . GERD (gastroesophageal reflux disease)   . High cholesterol   . Hypertension   . Migraine   . Neuropathy   . Sarcoidosis   . Sleep apnea   . Thyroid disease   . Vitamin D deficiency     Past Surgical History:  Procedure Laterality Date  . BREAST REDUCTION SURGERY  02/18/2015  . CARPAL TUNNEL RELEASE  2014  . CARPAL TUNNEL RELEASE  04/2016  . [place]  . NASAL POLYP SURGERY  1983  . SINOSCOPY  10/09/2014  . TONSILLECTOMY  1978    Review of systems  negative except as noted in HPI / PMHx or noted below:  Review of Systems  Constitutional: Negative.   HENT: Negative.   Eyes: Negative.   Respiratory: Negative.   Cardiovascular: Negative.   Gastrointestinal: Negative.   Genitourinary: Negative.   Musculoskeletal: Negative.   Skin: Negative.   Neurological: Negative.   Endo/Heme/Allergies: Negative.   Psychiatric/Behavioral: Negative.      Objective:   Vitals:   06/25/16 1058  BP: 118/70  Pulse: 68  Resp: 18          Physical Exam  Constitutional: She is well-developed, well-nourished, and in no distress.  HENT:  Head: Normocephalic.  Right Ear: Tympanic  membrane, external ear and ear canal normal.  Left Ear: Tympanic membrane, external ear and ear canal normal.  Nose: Nose normal. No mucosal edema or rhinorrhea.  Mouth/Throat: Uvula is midline, oropharynx is clear and moist and mucous membranes are normal. No oropharyngeal exudate.  Eyes: Conjunctivae are normal.  Neck: Trachea normal. No tracheal tenderness present. No tracheal deviation present. No thyromegaly present.  Cardiovascular: Normal rate, regular rhythm, S1 normal, S2 normal and normal heart sounds.   No murmur heard. Pulmonary/Chest: Breath sounds normal. No stridor. No respiratory distress. She has no wheezes. She has no rales.  Musculoskeletal: She exhibits no edema.  Lymphadenopathy:       Head (right side): No tonsillar adenopathy present.       Head (left side): No tonsillar adenopathy present.    She has no cervical adenopathy.  Neurological: She is alert. Gait normal.  Skin: No rash noted. She is not diaphoretic. No erythema. Nails show no clubbing.  Psychiatric: Mood and affect normal.    Diagnostics:    Spirometry was performed and demonstrated an FEV1 of 1.97 at 91 % of predicted.  The patient had an Asthma Control Test with the following results: ACT Total Score: 15.    Assessment and Plan:   1. Asthma, moderate persistent,  well-controlled   2. Sarcoidosis of lung ([place])   3. Other allergic rhinitis   4. History of chronic sinusitis   5. Migraine syndrome   6. LPRD (laryngopharyngeal reflux disease)     1. Continue to perform Allergen avoidance measures  2. Continue toTreat and prevent inflammation:   A. montelukast 10 mg daily  B. Symbicort 160 2 inhalations 2 times a day w/ spacer (replaces Breo)  C. Qnasl 80 - 2 puffs each nostril one time per day (P.A.)    3. Continue toTreat and prevent reflux:   A. Dexilant 60mg  one tablet in a.m.   B. ranitidine 300 mg one tablet in PM  C. Continue off all forms of caffeine including chocolate  D. Revisit with Dr. Hannah Beat. Obtain gastric emptying scan    4. Continue to Treat and prevent headache:   A. Periactin 4 mg tablet to 1 tablet at bedtime  B. reevaluation with neurologist in [place]  5. If needed:   A. Astelin 2 sprays each nostril twice a day  B. albuterol MDI 2 puffs every 4-6 hours  C. cetirizine 10 mg one time per day  D. Nasal saline multiple times per day  E. OTC Mucinex   6. Return to clinic in 4 weeks or earlier if problem  7. May need to see Dr Gaylyn Cheers again for look at throat if not better  Lashawndra has several active issues. I'm going to change her dry powder inhaler to Symbicort as [place] [employer] may be causing some degree of throat problem. She appears to have active reflux even with aggressive therapy and without the use of any caffeine or chocolate and I'm going to obtain a gastric emptying scan to see if she has delayed gastric emptying based upon her diabetes. As well, she does have this difficulty transitioning food from her mouth to her esophagus and she may require a modified barium swallow to look into this a little more detail if this continues. I think it would be worthwhile to have her revisit with her gastroenterologist to see if any additional investigation is required. It's difficult to tell if her spells or coughing are secondary to  asthma or secondary to reflux-induced respiratory  disease but other than changing her to Symbicort I'm not going to have her undergo any further treatment change for her asthma. She still has significant headaches and I have encouraged her to follow with her neurologist concerning further evaluation of this issue during her upcoming July visit. I do not know the cause of her event Saturday night that was associated with "pulsating head", tachycardia, and a very transient episode of numbness of her lower extremities. She can discuss this also with her neurologist. I'm going to see her back in this clinic in 4 weeks to assess her response to the therapeutic changes noted above.  Allena Katz, MD Allergy / Immunology [place]

## 2016-06-25 NOTE — Patient Instructions (Addendum)
  1. Continue to perform Allergen avoidance measures  2. Continue toTreat and prevent inflammation:   A. montelukast 10 mg daily  B. Symbicort 160 2 inhalations 2 times a day w/ spacer (replaces Breo)  C. Qnasl 80 - 2 puffs each nostril one time per day (P.A.)    3. Continue toTreat and prevent reflux:   A. Dexilant 60mg  one tablet in a.m.   B. ranitidine 300 mg one tablet in PM  C. Continue off all forms of caffeine including chocolate  D. Revisit with Dr. Hannah Beat. Obtain gastric emptying scan    4. Continue to Treat and prevent headache:   A. Periactin 4 mg tablet to 1 tablet at bedtime  B. reevaluation with neurologist in [place]  5. If needed:   A. Astelin 2 sprays each nostril twice a day  B. albuterol MDI 2 puffs every 4-6 hours  C. cetirizine 10 mg one time per day  D. Nasal saline multiple times per day  E. OTC Mucinex   6. Return to clinic in 4 weeks or earlier if problem  7. May need to see Dr Gaylyn Cheers again for look at throat if not better

## 2016-06-26 ENCOUNTER — Telehealth: Payer: Self-pay

## 2016-06-26 NOTE — Telephone Encounter (Signed)
I informed patient of the gastric emptying scan, scheduled at [facility] for Thursday, June 28th, to arrive at 7:30am..nothing to eat or drink and no medications for 6 hours prior to test. Also told patient we had a free spacer for her to pick up. Will leave copy of test requisition and spacer at front desk for her to pick up. Note: no PA required for scan per [facility]. Telephone call reference number: [id number]. Also,  I faxed a copy of test requisition to [facility] scheduling.

## 2016-07-02 DIAGNOSIS — K3184 Gastroparesis: Secondary | ICD-10-CM | POA: Diagnosis not present

## 2016-07-02 DIAGNOSIS — R140 Abdominal distension (gaseous): Secondary | ICD-10-CM | POA: Diagnosis not present

## 2016-07-02 DIAGNOSIS — E119 Type 2 diabetes mellitus without complications: Secondary | ICD-10-CM | POA: Diagnosis not present

## 2016-07-03 DIAGNOSIS — Z6841 Body Mass Index (BMI) 40.0 and over, adult: Secondary | ICD-10-CM | POA: Diagnosis not present

## 2016-07-03 DIAGNOSIS — M25511 Pain in right shoulder: Secondary | ICD-10-CM | POA: Diagnosis not present

## 2016-07-06 ENCOUNTER — Ambulatory Visit: Payer: Medicare HMO | Admitting: Allergy and Immunology

## 2016-07-07 ENCOUNTER — Telehealth: Payer: Self-pay

## 2016-07-07 NOTE — Telephone Encounter (Signed)
Left message for pt to call office. Nuclear scan of gastric emptying was normal.

## 2016-07-07 NOTE — Telephone Encounter (Signed)
err

## 2016-07-07 NOTE — Telephone Encounter (Signed)
Pt advised on results.

## 2016-07-09 ENCOUNTER — Encounter: Payer: Self-pay | Admitting: *Deleted

## 2016-07-10 ENCOUNTER — Ambulatory Visit (INDEPENDENT_AMBULATORY_CARE_PROVIDER_SITE_OTHER): Payer: Medicare HMO | Admitting: Neurology

## 2016-07-10 ENCOUNTER — Encounter: Payer: Self-pay | Admitting: Neurology

## 2016-07-10 VITALS — BP 134/68 | HR 60 | Resp 12 | Ht 60.0 in | Wt 225.0 lb

## 2016-07-10 DIAGNOSIS — G44229 Chronic tension-type headache, not intractable: Secondary | ICD-10-CM

## 2016-07-10 DIAGNOSIS — R404 Transient alteration of awareness: Secondary | ICD-10-CM

## 2016-07-10 DIAGNOSIS — G629 Polyneuropathy, unspecified: Secondary | ICD-10-CM | POA: Diagnosis not present

## 2016-07-10 HISTORY — DX: Chronic tension-type headache, not intractable: G44.229

## 2016-07-10 MED ORDER — PREGABALIN 150 MG PO CAPS: ORAL_CAPSULE | ORAL | 5 refills | Status: DC

## 2016-07-10 NOTE — Progress Notes (Signed)
NEUROLOGY FOLLOW UP OFFICE NOTE  Melanie Meyers [id number]  HISTORY OF PRESENT ILLNESS: I had the pleasure of seeing Melanie Meyers in follow-up in the neurology clinic on 07/10/2016.  The patient was last seen 6 months ago after an episode of loss of awareness last July 2017. MRI brain with and without contrast which did not show any acute changes, hippocampi symmetric with no abnormal signal or enhancement seen. Her 1-hour wake and sleep EEG was normal. Single-lead EKG showed sinus bradycardia. She was also reporting numbness and tingling in both arms and legs, EMG/NCV of the right UE and LE showed no evidence of large fiber neuropathy, there was right median neuropathy (carpal tunnel syndrome), mild in degree. Since her last visit, she denies any further episodes of loss of awareness since July 2017. She had carpal tunnel surgery last April and still has some pain in her right forearm. She is also reporting pain in her chest, underarm, and different places in her arm. She reports a diagnosis of fibromyalgia in the past. She has neuropathy and was taking Lyrica, asked to switch to gabapentin, then had side effects and is now back on Lyrica 150mg  BID. She continues to report headaches occurring around 4 times a week. She has pain today over the right temporal area, pain moves around. There is some nausea and sensitivity to sounds. No diplopia. She takes Naproxen twice a day for various aches and pains. She has dizziness when turning in bed. She had and episode last month after working hard in the yard, she felt her pulse was high. She noted her pulse rate was 139, lay down but HR stayed at 110-115 for over an hour, no chest pain. She had a BM and when she stood up noted numbness and tingling from the waist down on her left side. This went away after she took a few steps. When she lay back down, her HR was back to 70. She has had a few falls, she had 2 falls last July 4th, hitting her chest and back.  She has neuropathy with numbness and tingling in both feet, as well as back pain. No bowel/bladder dysfunction.   HPI 10/09/15: This is a 61 yo RH woman with a history of hypertension, diabetes, sleep apnea on CPAP, sarcoidosis, B12 deficiency, hypothyroidism, anxiety, migraines, with a transient episode of loss of awareness last 07/24/2015. She recalls sitting in the car with her daughter then suddenly feeling sleepy. She recalls closing her eyes, then waking up in the ER. She then went to [facility] ER, records reviewed. Her other daughter witnessed her head bobbing back and forth, she was not responding to questions although she was reported to be awake. She was told her face was droopy and her speech was slurred when she asked her daughter why she was being taken to the ER (she does not recall this). They stopped on the road and she got out of the car, then fell backwards, no injuries. They went to a different ER but left due to a long wait, then went to [facility] where she was back to baseline. The episode lasted 45 minutes. She reported a mild headache with photophobia all day prior to the event, and was mildly nauseated in the car. She reported feeling mildly numb on her left arm, but today denies any focal symptoms. Bloodwork showed unremarkable CBC, CMP, negative ethanol level. I personally reviewed head CT without contrast which did not show any acute changes. She denies any further episodes  since then, no history of similar episodes in the past.  She and her daughter deny any other staring/unresponsive episodes or gaps in time, no olfactory/gustatory hallucinations, deja vu, rising epigastric sensation, focal numbness/tingling/weakness, myoclonic jerks. She has a history of migraines that used to respond to Relpax, however over the past 3-4 months, she has been having a constant throbbing headache over the vertex and occipital regions, waxing and waning in intensity, with associated photosensitivity, no  nausea/vomiting. Imitrex does not help as much as the Relpax, however insurance will not cover Relpax. She has occasional dizziness described as lightheadedness and occasional spinning, lasting a few seconds, which can occur with any position. She has chronic neck and back pain. She has a history of neuropathy with numbness and tingling in both arms and legs, she states the neuropathy in her arms are due to B12 deficiency, and due to diabetic neuropathy in her legs. Her last HbA1c was 6.1. She is taking Lyrica 75mg  BID. On higher dose (150mg  BID), she was told by her son she had psychiatric problems, which hurt her. She does not see Psychiatry. She reports unrefreshing sleep despite use of CPAP machine. She has been forgetful for the past few months, she forgets words. She drives and denies getting lost driving. She lives with her daughter and denies any missed bill payments or missed medications. Her mother and maternal aunt had dementia. She reports starting Paxil a week before the episode, this was switched to Abilify 2 weeks ago.   She had a normal birth and early development.  There is no history of febrile convulsions, CNS infections such as meningitis/encephalitis, significant traumatic brain injury, neurosurgical procedures, or family history of seizures.    PAST MEDICAL HISTORY: Past Medical History:  Diagnosis Date  . Asthma   . Degenerative arthritis    degenerative arthritis of neck  . Depression   . Diabetes mellitus without complication ([place])   . Fibromyalgia   . GERD (gastroesophageal reflux disease)   . High cholesterol   . Hypertension   . Migraine   . Neuropathy   . Sarcoidosis   . Sleep apnea   . Thyroid disease   . Vitamin D deficiency     MEDICATIONS: Current Outpatient Prescriptions on File Prior to Visit  Medication Sig Dispense Refill  . ACCU-CHEK AVIVA PLUS test strip 1 each by Other route as needed (for BS).     Marland Kitchen albuterol (VENTOLIN HFA) 108 (90 Base) MCG/ACT  inhaler Inhale two puffs every four to six hours as needed for cough or wheeze.    Marland Kitchen AMBULATORY NON FORMULARY MEDICATION 1 Units by Other route daily. Wear right wrist splint daily. 1 Units 0  . aspirin EC 81 MG tablet Take 81 mg by mouth at bedtime.     Marland Kitchen azelastine (ASTELIN) 0.1 % nasal spray Place 2 sprays into both nostrils 2 (two) times daily. Use in each nostril as directed    . baclofen (LIORESAL) 10 MG tablet     . BIOTIN PO Take 5,000 mcg by mouth at bedtime.     . budesonide-formoterol (SYMBICORT) 160-4.5 MCG/ACT inhaler Inhale two puffs twice daily with spacer to prevent cough or wheeze.  Rinse, gargle, and spit after use. 1 Inhaler 5  . cetirizine (ZYRTEC) 10 MG tablet Take 10 mg by mouth at bedtime.     . cyproheptadine (PERIACTIN) 4 MG tablet Take one-half to one tablet once daily at bedtime. 30 tablet 5  . dexlansoprazole (DEXILANT) 60 MG capsule Take  one capsule by mouth every morning as directed. 30 capsule 5  . fluticasone furoate-vilanterol (BREO ELLIPTA) 200-25 MCG/INH AEPB Inhale 1 puff into the lungs at bedtime.     Marland Kitchen levothyroxine (SYNTHROID, LEVOTHROID) 25 MCG tablet Take 25 mcg by mouth daily before breakfast.    . losartan (COZAAR) 25 MG tablet Take 25 mg by mouth at bedtime.     . metFORMIN (GLUCOPHAGE) 1000 MG tablet Take 1,000 mg by mouth 2 (two) times daily with a meal.     . metoprolol tartrate (LOPRESSOR) 25 MG tablet Take 25 mg by mouth 2 (two) times daily.    . montelukast (SINGULAIR) 10 MG tablet Take 10 mg by mouth at bedtime.     . naproxen (NAPROSYN) 500 MG tablet Take 500 mg by mouth 2 (two) times daily with a meal.     . Olopatadine HCl (PATADAY) 0.2 % SOLN Apply 1 drop to eye daily as needed (for dry eyes).     . polyethylene glycol (MIRALAX / GLYCOLAX) packet Take 17 g by mouth daily with breakfast.     . pravastatin (PRAVACHOL) 40 MG tablet Take 40 mg by mouth at bedtime.    . pregabalin (LYRICA) 150 MG capsule Take 1 capsule (150 mg total) by mouth 2  (two) times daily. 90 capsule 5  . QNASL 80 MCG/ACT AERS     . ranitidine (ZANTAC) 300 MG tablet Take 1 tablet (300 mg total) by mouth at bedtime. 30 tablet 5  . rizatriptan (MAXALT) 10 MG tablet     . silver sulfADIAZINE (SILVADENE) 1 % cream Apply 1 application topically 2 (two) times daily.    . SOLIQUA 100-33 UNT-MCG/ML SOPN 30 Units every morning.     Marland Kitchen UNIFINE PENTIPS 31G X 6 MM MISC     . vortioxetine HBr (TRINTELLIX) 10 MG TABS Take by mouth.    . zolpidem (AMBIEN) 10 MG tablet Take 10 mg by mouth at bedtime.      Current Facility-Administered Medications on File Prior to Visit  Medication Dose Route Frequency Provider Last Rate Last Dose  . triamcinolone acetonide (KENALOG) 10 MG/ML injection 10 mg  10 mg Other Once Landis Martins, DPM        ALLERGIES: Allergies  Allergen Reactions  . Accupril [Quinapril Hcl] Cough  . Neurontin [Gabapentin] Other (See Comments)    Didn't work  . Prednisone Other (See Comments)    Has to be cautious due to BS  . Topamax [Topiramate] Other (See Comments)    Didn't work  . Levaquin [Levofloxacin] Rash    FAMILY HISTORY: Family History  Problem Relation Age of Onset  . Parkinson's disease Mother   . Diabetes Mother   . Alzheimer's disease Mother   . Gout Mother   . High blood pressure Father   . High Cholesterol Father   . Heart attack Father   . Thyroid disease Sister   . Heart attack Sister   . Thyroid disease Sister   . Thyroid disease Sister     SOCIAL HISTORY: Social History   Social History  . Marital status: Widowed    Spouse name: N/A  . Number of children: N/A  . Years of education: N/A   Occupational History  . Not on file.   Social History Main Topics  . Smoking status: Never Smoker  . Smokeless tobacco: Never Used  . Alcohol use No  . Drug use: No  . Sexual activity: Not on file   Other Topics Concern  .  Not on file   Social History Narrative  . No narrative on file    REVIEW OF  SYSTEMS: Constitutional: No fevers, chills, or sweats, no generalized fatigue, change in appetite Eyes: No visual changes, double vision, eye pain Ear, nose and throat: No hearing loss, ear pain, nasal congestion, sore throat Cardiovascular: No chest pain, palpitations Respiratory:  No shortness of breath at rest or with exertion, wheezes GastrointestinaI: No nausea, vomiting, diarrhea, abdominal pain, fecal incontinence Genitourinary:  No dysuria, urinary retention or frequency Musculoskeletal:  No neck pain,+ back pain Integumentary: No rash, pruritus, skin lesions Neurological: as above Psychiatric: + depression, no insomnia, anxiety Endocrine: No palpitations, fatigue, diaphoresis, mood swings, change in appetite, change in weight, increased thirst Hematologic/Lymphatic:  No anemia, purpura, petechiae. Allergic/Immunologic: no itchy/runny eyes, nasal congestion, recent allergic reactions, rashes  PHYSICAL EXAM: Vitals:   07/10/16 0839  BP: 134/68  Pulse: 60  Resp: 12   General: No acute distress Head:  Normocephalic/atraumatic Neck: supple, no paraspinal tenderness, full range of motion Heart:  Regular rate and rhythm Lungs:  Clear to auscultation bilaterally Back: No paraspinal tenderness Skin/Extremities: No rash, no edema Neurological Exam: alert and oriented to person, place, and time. No aphasia or dysarthria. Fund of knowledge is appropriate.  Recent and remote memory are intact.  Attention and concentration are normal.    Able to name objects and repeat phrases. Cranial nerves: Pupils equal, round, reactive to light.  Fundoscopy shows sharp discs bilaterally. Extraocular movements intact with no nystagmus. Visual fields full. Facial sensation intact. No facial asymmetry. Tongue, uvula, palate midline.  Motor: Bulk and tone normal, muscle strength 5/5 throughout with no pronator drift.  Sensation to light touch intact.  No extinction to double simultaneous stimulation.  Deep  tendon reflexes +1 throughout, toes downgoing.  Finger to nose testing intact.  Gait narrow-based and steady, difficulty with tandem walk.  Romberg positive.  IMPRESSION: This is a 61 yo RH woman with a history of history of hypertension, diabetes, sleep apnea on CPAP, sarcoidosis, B12 deficiency, hypothyroidism, anxiety, migraines, who had a transient episode of loss of awareness last 07/24/2015.The etiology of the episode is unclear, MRI brain and 1-hour EG were normal. Seizure is a consideration, however we had discussed that after an initial seizure, unless there are significant risk factors, an abnormal neurological exam, an EEG showing epileptiform abnormalities, and/or abnormal neuroimaging, treatment with an antiepileptic drug is not indicated. Continue to monitor symptoms, no further recurrence since July 2017. She is taking Lyrica for neuropathic pain. She also reports headaches 4x a week suggestive of tension-type headaches (potentially medication overuse as well). She will increase Lyrica to 150mg  in AM, 300mg  in PM. She is taking Naproxen for various aches and pains and was advised to minimize intake to 2-3 a week to avoid rebound headaches. She has had a few falls likely due to neuropathy, she will be referred for Balance therapy. She will follow-up in 6 months and knows to call for any changes.   Thank you for allowing me to participate in her care.  Please do not hesitate to call for any questions or concerns.  The duration of this appointment visit was 25 minutes of face-to-face time with the patient.  Greater than 50% of this time was spent in counseling, explanation of diagnosis, planning of further management, and coordination of care.   Ellouise Newer, M.D.   CC: Dr. Rica Records

## 2016-07-10 NOTE — Patient Instructions (Signed)
1. Increase Lyrica 150mg : Take 1 cap in AM, 2 caps in PM 2. Refer to PT for Balance therapy 3. Follow-up in 6 months, call for any changes

## 2016-07-13 DIAGNOSIS — E1142 Type 2 diabetes mellitus with diabetic polyneuropathy: Secondary | ICD-10-CM | POA: Diagnosis not present

## 2016-07-17 DIAGNOSIS — R1310 Dysphagia, unspecified: Secondary | ICD-10-CM | POA: Diagnosis not present

## 2016-07-17 DIAGNOSIS — K210 Gastro-esophageal reflux disease with esophagitis: Secondary | ICD-10-CM | POA: Diagnosis not present

## 2016-07-23 ENCOUNTER — Ambulatory Visit (INDEPENDENT_AMBULATORY_CARE_PROVIDER_SITE_OTHER): Payer: Medicare HMO | Admitting: Allergy and Immunology

## 2016-07-23 ENCOUNTER — Encounter: Payer: Self-pay | Admitting: Allergy and Immunology

## 2016-07-23 VITALS — BP 108/60 | HR 64 | Resp 16

## 2016-07-23 DIAGNOSIS — D860 Sarcoidosis of lung: Secondary | ICD-10-CM

## 2016-07-23 DIAGNOSIS — Z8709 Personal history of other diseases of the respiratory system: Secondary | ICD-10-CM | POA: Diagnosis not present

## 2016-07-23 DIAGNOSIS — K219 Gastro-esophageal reflux disease without esophagitis: Secondary | ICD-10-CM

## 2016-07-23 DIAGNOSIS — J3089 Other allergic rhinitis: Secondary | ICD-10-CM

## 2016-07-23 DIAGNOSIS — G43909 Migraine, unspecified, not intractable, without status migrainosus: Secondary | ICD-10-CM

## 2016-07-23 DIAGNOSIS — J4540 Moderate persistent asthma, uncomplicated: Secondary | ICD-10-CM | POA: Diagnosis not present

## 2016-07-23 MED ORDER — CYPROHEPTADINE HCL 4 MG PO TABS
ORAL_TABLET | ORAL | 3 refills | Status: DC
Start: 2016-07-23 — End: 2017-01-12

## 2016-07-23 NOTE — Patient Instructions (Signed)
  1. Continue to perform Allergen avoidance measures  2. Continue toTreat and prevent inflammation:   A. montelukast 10 mg daily  B. Symbicort 160 2 inhalations 2 times a day w/ spacer  C. Decrease Qnasl 80 - 1-2 puffs each nostril 3-7 times per week    3. Continue toTreat and prevent reflux:   A. Dexilant 60mg  one tablet in a.m.   B. ranitidine 300 mg one tablet in PM  C. Continue off all forms of caffeine including chocolate   4. Continue to Treat and prevent headache:   A. Increase Periactin 4 mg tablet to 2 tablet at bedtime  5. If needed:   A. Astelin 2 sprays each nostril twice a day  B. albuterol MDI 2 puffs every 4-6 hours  C. cetirizine 10 mg one time per day  D. Nasal saline multiple times per day  E. OTC Mucinex   6. Return to clinic in 4 weeks or earlier if problem

## 2016-07-23 NOTE — Progress Notes (Signed)
Follow-up Note  Referring Provider: Nicholos Johns, MD Primary Provider: Nicholos Johns, MD Date of Office Visit: 07/23/2016  Subjective:   Melanie Meyers (DOB: July 31, 1955) is a 61 y.o. female who returns to the [facility] on 07/23/2016 in re-evaluation of the following:  HPI: Melanie Meyers returns to this clinic in reevaluation of her asthma, history of sarcoidosis, allergic rhinitis, history of chronic sinusitis requiring sinus surgery, reflux with LPR, and migraine headache. Her last visit to this clinic was 06/25/2016 at which point in time we assigned a plan to see if we could affect each one of these issues.  Regarding her spells of cough, they are somewhat better. She still must use a cough drop about 1 time per day. Her bronchodilator uses is about twice a week.   Her nose has become a little bit raw inside and she feels as though it is burning when she uses her nose sprays.  Her reflux is better. She does not have as much postnasal drip and raw throat. She is scheduled for a barium swallow as she still has this problem with transitioning food from her mouth into her esophagus.  Her headaches are no better. She did visit with the neurologist but apparently the headache issue was not addressed and the visit was more focused on her neuropathy with an increase in her Lyrica which has helped this issue. She continues on CPAP for her sleep apnea  Allergies as of 07/23/2016      Reactions   Accupril [quinapril Hcl] Cough   Neurontin [gabapentin] Other (See Comments)   Didn't work   Prednisone Other (See Comments)   Has to be cautious due to BS   Topamax [topiramate] Other (See Comments)   Didn't work   Levaquin [levofloxacin] Rash      Medication List      ACCU-CHEK AVIVA PLUS test strip Generic drug:  glucose blood 1 each by Other route as needed (for BS).   AMBULATORY NON FORMULARY MEDICATION 1 Units by Other route daily. Wear right wrist splint daily.     aspirin EC 81 MG tablet Take 81 mg by mouth at bedtime.   azelastine 0.1 % nasal spray Commonly known as:  ASTELIN Place 2 sprays into both nostrils 2 (two) times daily. Use in each nostril as directed   baclofen 10 MG tablet Commonly known as:  LIORESAL   BIOTIN PO Take 5,000 mcg by mouth at bedtime.   budesonide-formoterol 160-4.5 MCG/ACT inhaler Commonly known as:  SYMBICORT Inhale two puffs twice daily with spacer to prevent cough or wheeze.  Rinse, gargle, and spit after use.   cetirizine 10 MG tablet Commonly known as:  ZYRTEC Take 10 mg by mouth at bedtime.   cyproheptadine 4 MG tablet Commonly known as:  PERIACTIN Take one-half to one tablet once daily at bedtime.   dexlansoprazole 60 MG capsule Commonly known as:  DEXILANT Take one capsule by mouth every morning as directed.   levothyroxine 25 MCG tablet Commonly known as:  SYNTHROID, LEVOTHROID Take 25 mcg by mouth daily before breakfast.   losartan 25 MG tablet Commonly known as:  COZAAR Take 25 mg by mouth at bedtime.   metFORMIN 1000 MG tablet Commonly known as:  GLUCOPHAGE Take 1,000 mg by mouth 2 (two) times daily with a meal.   metoprolol tartrate 25 MG tablet Commonly known as:  LOPRESSOR Take 25 mg by mouth 2 (two) times daily.   montelukast 10 MG tablet Commonly known  as:  SINGULAIR Take 10 mg by mouth at bedtime.   naproxen 500 MG tablet Commonly known as:  NAPROSYN Take 500 mg by mouth 2 (two) times daily with a meal.   PATADAY 0.2 % Soln Generic drug:  Olopatadine HCl Apply 1 drop to eye daily as needed (for dry eyes).   polyethylene glycol packet Commonly known as:  MIRALAX / GLYCOLAX Take 17 g by mouth daily with breakfast.   pravastatin 40 MG tablet Commonly known as:  PRAVACHOL Take 40 mg by mouth at bedtime.   pregabalin 150 MG capsule Commonly known as:  LYRICA Take 1 cap in AM, 2 caps in PM   QNASL 80 MCG/ACT Aers Generic drug:  Beclomethasone Dipropionate    ranitidine 300 MG tablet Commonly known as:  ZANTAC Take 1 tablet (300 mg total) by mouth at bedtime.   rizatriptan 10 MG tablet Commonly known as:  MAXALT   silver sulfADIAZINE 1 % cream Commonly known as:  SILVADENE Apply 1 application topically 2 (two) times daily.   SOLIQUA 100-33 UNT-MCG/ML Sopn Generic drug:  Insulin Glargine-Lixisenatide 30 Units every morning.   TRINTELLIX 10 MG Tabs Generic drug:  vortioxetine HBr Take by mouth.   UNIFINE PENTIPS 31G X 6 MM Misc Generic drug:  Insulin Pen Needle   VENTOLIN HFA 108 (90 Base) MCG/ACT inhaler Generic drug:  albuterol Inhale two puffs every four to six hours as needed for cough or wheeze.   zolpidem 10 MG tablet Commonly known as:  AMBIEN Take 10 mg by mouth at bedtime.       Past Medical History:  Diagnosis Date  . Asthma   . Degenerative arthritis    degenerative arthritis of neck  . Depression   . Diabetes mellitus without complication ([place])   . Fibromyalgia   . GERD (gastroesophageal reflux disease)   . High cholesterol   . Hypertension   . Migraine   . Neuropathy   . Sarcoidosis   . Sleep apnea   . Thyroid disease   . Vitamin D deficiency     Past Surgical History:  Procedure Laterality Date  . BREAST REDUCTION SURGERY  02/18/2015  . CARPAL TUNNEL RELEASE  2014  . CARPAL TUNNEL RELEASE  04/2016  . [place]  . NASAL POLYP SURGERY  1983  . SINOSCOPY  10/09/2014  . TONSILLECTOMY  1978    Review of systems negative except as noted in HPI / PMHx or noted below:  Review of Systems  Constitutional: Negative.   HENT: Negative.   Eyes: Negative.   Respiratory: Negative.   Cardiovascular: Negative.   Gastrointestinal: Negative.   Genitourinary: Negative.   Musculoskeletal: Negative.   Skin: Negative.   Neurological: Negative.   Endo/Heme/Allergies: Negative.   Psychiatric/Behavioral: Negative.      Objective:   Vitals:   07/23/16 1140  BP: 108/60  Pulse:  64  Resp: 16          Physical Exam  Constitutional: She is well-developed, well-nourished, and in no distress.  HENT:  Head: Normocephalic.  Right Ear: Tympanic membrane, external ear and ear canal normal.  Left Ear: Tympanic membrane, external ear and ear canal normal.  Nose: Nose normal. No mucosal edema or rhinorrhea.  Mouth/Throat: Uvula is midline, oropharynx is clear and moist and mucous membranes are normal. No oropharyngeal exudate.  Eyes: Conjunctivae are normal.  Neck: Trachea normal. No tracheal tenderness present. No tracheal deviation present. No thyromegaly present.  Cardiovascular: Normal rate, regular  rhythm, S1 normal, S2 normal and normal heart sounds.   No murmur heard. Pulmonary/Chest: Breath sounds normal. No stridor. No respiratory distress. She has no wheezes. She has no rales.  Musculoskeletal: She exhibits no edema.  Lymphadenopathy:       Head (right side): No tonsillar adenopathy present.       Head (left side): No tonsillar adenopathy present.    She has no cervical adenopathy.  Neurological: She is alert. Gait normal.  Skin: No rash noted. She is not diaphoretic. No erythema. Nails show no clubbing.  Psychiatric: Mood and affect normal.    Diagnostics: Results of a gastric emptying scan performs 07/01/2016 identified no significant abnormality with gastric emptying.   Spirometry was performed and demonstrated an FEV1 of 1.65 at 76 % of predicted.  The patient had an Asthma Control Test with the following results: ACT Total Score: 19.    Assessment and Plan:   1. Asthma, moderate persistent, well-controlled   2. Sarcoidosis of lung ([place])   3. Other allergic rhinitis   4. History of chronic sinusitis   5. Migraine syndrome   6. LPRD (laryngopharyngeal reflux disease)     1. Continue to perform Allergen avoidance measures  2. Continue toTreat and prevent inflammation:   A. montelukast 10 mg daily  B. Symbicort 160 2 inhalations 2 times a  day w/ spacer  C. Decrease Qnasl 80 - 1-2 puffs each nostril 3-7 times per week    3. Continue toTreat and prevent reflux:   A. Dexilant 60mg  one tablet in a.m.   B. ranitidine 300 mg one tablet in PM  C. Continue off all forms of caffeine including chocolate   4. Continue to Treat and prevent headache:   A. Increase Periactin 4 mg tablet to 2 tablet at bedtime  5. If needed:   A. Astelin 2 sprays each nostril twice a day  B. albuterol MDI 2 puffs every 4-6 hours  C. cetirizine 10 mg one time per day  D. Nasal saline multiple times per day  E. OTC Mucinex   6. Return to clinic in 4 weeks or earlier if problem  Ottie appears to have some improvement regarding her respiratory tract issues with her current therapy. We will continue to have her use treatment directed against inflammation and reflux-induced respiratory disease. I think we may be getting into somewhat of a problem with her Qnasl and I have asked her to decrease her dose aiming for that dose that gives her improvement but does not precipitate a side effect. As well, we need to do something about her headaches and will initially start by increasing her Periactin as noted above. I will regroup with her in 4 weeks and we will make a decision about how to proceed pending her response.  Allena Katz, MD Allergy / Immunology [facility]

## 2016-07-24 ENCOUNTER — Telehealth: Payer: Self-pay | Admitting: Neurology

## 2016-07-24 DIAGNOSIS — G4733 Obstructive sleep apnea (adult) (pediatric): Secondary | ICD-10-CM | POA: Diagnosis not present

## 2016-07-24 DIAGNOSIS — K224 Dyskinesia of esophagus: Secondary | ICD-10-CM | POA: Diagnosis not present

## 2016-07-24 DIAGNOSIS — R1312 Dysphagia, oropharyngeal phase: Secondary | ICD-10-CM | POA: Diagnosis not present

## 2016-07-24 NOTE — Telephone Encounter (Signed)
I will send referral to [facility].

## 2016-07-24 NOTE — Telephone Encounter (Signed)
Pt does not want to drive to [facility] for her PT.  She would like referral sent to [facility] in [place].  The phone number for this rehab is [phone].

## 2016-08-06 DIAGNOSIS — R42 Dizziness and giddiness: Secondary | ICD-10-CM | POA: Diagnosis not present

## 2016-08-06 DIAGNOSIS — R2689 Other abnormalities of gait and mobility: Secondary | ICD-10-CM | POA: Diagnosis not present

## 2016-08-06 DIAGNOSIS — M6281 Muscle weakness (generalized): Secondary | ICD-10-CM | POA: Diagnosis not present

## 2016-08-07 DIAGNOSIS — R1310 Dysphagia, unspecified: Secondary | ICD-10-CM | POA: Diagnosis not present

## 2016-08-10 DIAGNOSIS — R2689 Other abnormalities of gait and mobility: Secondary | ICD-10-CM | POA: Diagnosis not present

## 2016-08-10 DIAGNOSIS — R42 Dizziness and giddiness: Secondary | ICD-10-CM | POA: Diagnosis not present

## 2016-08-10 DIAGNOSIS — M6281 Muscle weakness (generalized): Secondary | ICD-10-CM | POA: Diagnosis not present

## 2016-08-12 DIAGNOSIS — Z794 Long term (current) use of insulin: Secondary | ICD-10-CM | POA: Diagnosis not present

## 2016-08-12 DIAGNOSIS — H524 Presbyopia: Secondary | ICD-10-CM | POA: Diagnosis not present

## 2016-08-12 DIAGNOSIS — H52223 Regular astigmatism, bilateral: Secondary | ICD-10-CM | POA: Diagnosis not present

## 2016-08-12 DIAGNOSIS — I10 Essential (primary) hypertension: Secondary | ICD-10-CM | POA: Diagnosis not present

## 2016-08-12 DIAGNOSIS — H25813 Combined forms of age-related cataract, bilateral: Secondary | ICD-10-CM | POA: Diagnosis not present

## 2016-08-12 DIAGNOSIS — Z7984 Long term (current) use of oral hypoglycemic drugs: Secondary | ICD-10-CM | POA: Diagnosis not present

## 2016-08-12 DIAGNOSIS — H5203 Hypermetropia, bilateral: Secondary | ICD-10-CM | POA: Diagnosis not present

## 2016-08-12 DIAGNOSIS — E119 Type 2 diabetes mellitus without complications: Secondary | ICD-10-CM | POA: Diagnosis not present

## 2016-08-13 DIAGNOSIS — R42 Dizziness and giddiness: Secondary | ICD-10-CM | POA: Diagnosis not present

## 2016-08-13 DIAGNOSIS — M6281 Muscle weakness (generalized): Secondary | ICD-10-CM | POA: Diagnosis not present

## 2016-08-13 DIAGNOSIS — R2689 Other abnormalities of gait and mobility: Secondary | ICD-10-CM | POA: Diagnosis not present

## 2016-08-17 DIAGNOSIS — M6281 Muscle weakness (generalized): Secondary | ICD-10-CM | POA: Diagnosis not present

## 2016-08-17 DIAGNOSIS — M79641 Pain in right hand: Secondary | ICD-10-CM | POA: Diagnosis not present

## 2016-08-17 DIAGNOSIS — R2689 Other abnormalities of gait and mobility: Secondary | ICD-10-CM | POA: Diagnosis not present

## 2016-08-17 DIAGNOSIS — R42 Dizziness and giddiness: Secondary | ICD-10-CM | POA: Diagnosis not present

## 2016-08-20 ENCOUNTER — Ambulatory Visit (INDEPENDENT_AMBULATORY_CARE_PROVIDER_SITE_OTHER): Payer: Medicare HMO | Admitting: Allergy and Immunology

## 2016-08-20 ENCOUNTER — Encounter: Payer: Self-pay | Admitting: Allergy and Immunology

## 2016-08-20 VITALS — BP 110/80 | HR 80 | Resp 16

## 2016-08-20 DIAGNOSIS — D860 Sarcoidosis of lung: Secondary | ICD-10-CM

## 2016-08-20 DIAGNOSIS — J4540 Moderate persistent asthma, uncomplicated: Secondary | ICD-10-CM | POA: Diagnosis not present

## 2016-08-20 DIAGNOSIS — G43909 Migraine, unspecified, not intractable, without status migrainosus: Secondary | ICD-10-CM

## 2016-08-20 DIAGNOSIS — Z8709 Personal history of other diseases of the respiratory system: Secondary | ICD-10-CM | POA: Diagnosis not present

## 2016-08-20 DIAGNOSIS — J3089 Other allergic rhinitis: Secondary | ICD-10-CM | POA: Diagnosis not present

## 2016-08-20 DIAGNOSIS — K219 Gastro-esophageal reflux disease without esophagitis: Secondary | ICD-10-CM

## 2016-08-21 DIAGNOSIS — M6281 Muscle weakness (generalized): Secondary | ICD-10-CM | POA: Diagnosis not present

## 2016-08-21 DIAGNOSIS — R2689 Other abnormalities of gait and mobility: Secondary | ICD-10-CM | POA: Diagnosis not present

## 2016-08-21 DIAGNOSIS — R42 Dizziness and giddiness: Secondary | ICD-10-CM | POA: Diagnosis not present

## 2016-08-21 NOTE — Patient Instructions (Signed)
  1. Continue to perform Allergen avoidance measures  2. Continue to Treat and prevent inflammation:   A. montelukast 10 mg daily  B. Symbicort 160 2 inhalations 2 times a day w/ spacer  C. Azelastine 1-2 sprays each nostril twice a day    3. Continue toTreat and prevent reflux:   A. Dexilant 60mg  one tablet in a.m.   B. ranitidine 300 mg one tablet in PM  C. Continue off all forms of caffeine including chocolate   4. Continue to Treat and prevent headache:   A. Periactin 4 mg tablet 1 - 2 tablet at bedtime  5. If needed:   A. albuterol MDI 2 puffs every 4-6 hours  B. cetirizine 10 mg one time per day  C. Nasal saline multiple times per day  D. OTC Mucinex   6. Return to clinic in 12 weeks or earlier if problem  7. Obtain fall flu vaccine

## 2016-08-21 NOTE — Progress Notes (Signed)
Follow-up Note  Referring Provider: Nicholos Johns, MD Primary Provider: Nicholos Johns, MD Date of Office Visit: 08/20/2016  Subjective:   Melanie Meyers (DOB: 11/22/1955) is a 61 y.o. female who returns to the [facility] on 08/20/2016 in re-evaluation of the following:  HPI: Melanie Meyers presents this clinic in evaluation of her asthma, history of sarcoidosis, allergic rhinitis, history of chronic sinusitis requiring FESS, LPR, and migraine. I last saw her in his clinic 07/23/2016.  She has continued to do very well with her current plan. She has resolved her spells of cough. She occasionally uses a cherry cough drop. Her requirement for bronchodilator is less than 1 time per week. She has had a decrease in her throat clearing and she is making a concerted effort to not throat clear. Occasionally when she is outside in the heat she will develop a problem with some chest tightness so she makes an attempt to not go out during the hot point of the day. Her nose is doing very well and she no longer has the burning sensation in her nose. Her reflux is doing very well and she is not consuming any caffeine or chocolate. She has had improvement regarding her headache intensity and frequency.  Allergies as of 08/20/2016      Reactions   Accupril [quinapril Hcl] Cough   Neurontin [gabapentin] Other (See Comments)   Didn't work   Prednisone Other (See Comments)   Has to be cautious due to BS   Topamax [topiramate] Other (See Comments)   Didn't work   Levaquin [levofloxacin] Rash      Medication List      ACCU-CHEK AVIVA PLUS test strip Generic drug:  glucose blood 1 each by Other route as needed (for BS).   AMBULATORY NON FORMULARY MEDICATION 1 Units by Other route daily. Wear right wrist splint daily.   aspirin EC 81 MG tablet Take 81 mg by mouth at bedtime.   azelastine 0.1 % nasal spray Commonly known as:  ASTELIN Place 2 sprays into both nostrils 2 (two) times  daily. Use in each nostril as directed   BIOTIN PO Take 5,000 mcg by mouth at bedtime.   budesonide-formoterol 160-4.5 MCG/ACT inhaler Commonly known as:  SYMBICORT Inhale two puffs twice daily with spacer to prevent cough or wheeze.  Rinse, gargle, and spit after use.   cetirizine 10 MG tablet Commonly known as:  ZYRTEC Take 10 mg by mouth at bedtime.   cyproheptadine 4 MG tablet Commonly known as:  PERIACTIN Take 2 tablets at bedtime   dexlansoprazole 60 MG capsule Commonly known as:  DEXILANT Take one capsule by mouth every morning as directed.   levothyroxine 25 MCG tablet Commonly known as:  SYNTHROID, LEVOTHROID Take 25 mcg by mouth daily before breakfast.   losartan 25 MG tablet Commonly known as:  COZAAR Take 25 mg by mouth at bedtime.   MAGNESIUM PO Take by mouth daily.   metFORMIN 1000 MG tablet Commonly known as:  GLUCOPHAGE Take 1,000 mg by mouth 2 (two) times daily with a meal.   metoprolol tartrate 25 MG tablet Commonly known as:  LOPRESSOR Take 25 mg by mouth 2 (two) times daily.   montelukast 10 MG tablet Commonly known as:  SINGULAIR Take 10 mg by mouth at bedtime.   naproxen 500 MG tablet Commonly known as:  NAPROSYN Take 500 mg by mouth 2 (two) times daily with a meal.   PATADAY 0.2 % Soln Generic drug:  Olopatadine HCl Apply 1 drop to eye daily as needed (for dry eyes).   polyethylene glycol packet Commonly known as:  MIRALAX / GLYCOLAX Take 17 g by mouth daily with breakfast.   pravastatin 40 MG tablet Commonly known as:  PRAVACHOL Take 40 mg by mouth at bedtime.   pregabalin 150 MG capsule Commonly known as:  LYRICA Take 1 cap in AM, 2 caps in PM   ranitidine 300 MG tablet Commonly known as:  ZANTAC Take 1 tablet (300 mg total) by mouth at bedtime.   rizatriptan 10 MG tablet Commonly known as:  MAXALT   silver sulfADIAZINE 1 % cream Commonly known as:  SILVADENE Apply 1 application topically 2 (two) times daily.     SOLIQUA 100-33 UNT-MCG/ML Sopn Generic drug:  Insulin Glargine-Lixisenatide 30 Units every morning.   UNIFINE PENTIPS 31G X 6 MM Misc Generic drug:  Insulin Pen Needle   VENTOLIN HFA 108 (90 Base) MCG/ACT inhaler Generic drug:  albuterol Inhale two puffs every four to six hours as needed for cough or wheeze.   zolpidem 10 MG tablet Commonly known as:  AMBIEN Take 10 mg by mouth at bedtime.       Past Medical History:  Diagnosis Date  . Asthma   . Degenerative arthritis    degenerative arthritis of neck  . Depression   . Diabetes mellitus without complication ([place])   . Fibromyalgia   . GERD (gastroesophageal reflux disease)   . High cholesterol   . Hypertension   . Migraine   . Neuropathy   . Sarcoidosis   . Sleep apnea   . Thyroid disease   . Vitamin D deficiency     Past Surgical History:  Procedure Laterality Date  . BREAST REDUCTION SURGERY  02/18/2015  . CARPAL TUNNEL RELEASE  2014  . CARPAL TUNNEL RELEASE  04/2016  . [place]  . NASAL POLYP SURGERY  1983  . SINOSCOPY  10/09/2014  . TONSILLECTOMY  1978    Review of systems negative except as noted in HPI / PMHx or noted below:  Review of Systems  Constitutional: Negative.   HENT: Negative.   Eyes: Negative.   Respiratory: Negative.   Cardiovascular: Negative.   Gastrointestinal: Negative.   Genitourinary: Negative.   Musculoskeletal: Negative.   Skin: Negative.   Neurological: Negative.   Endo/Heme/Allergies: Negative.   Psychiatric/Behavioral: Negative.      Objective:   Vitals:   08/20/16 1038  BP: 110/80  Pulse: 80  Resp: 16          Physical Exam  Constitutional: She is well-developed, well-nourished, and in no distress.  HENT:  Head: Normocephalic.  Right Ear: Tympanic membrane, external ear and ear canal normal.  Left Ear: Tympanic membrane, external ear and ear canal normal.  Nose: Nose normal. No mucosal edema or rhinorrhea.  Mouth/Throat:  Uvula is midline, oropharynx is clear and moist and mucous membranes are normal. No oropharyngeal exudate.  Eyes: Conjunctivae are normal.  Neck: Trachea normal. No tracheal tenderness present. No tracheal deviation present. No thyromegaly present.  Cardiovascular: Normal rate, regular rhythm, S1 normal, S2 normal and normal heart sounds.   No murmur heard. Pulmonary/Chest: Breath sounds normal. No stridor. No respiratory distress. She has no wheezes. She has no rales.  Musculoskeletal: She exhibits no edema.  Lymphadenopathy:       Head (right side): No tonsillar adenopathy present.       Head (left side): No tonsillar adenopathy present.  She has no cervical adenopathy.  Neurological: She is alert. Gait normal.  Skin: No rash noted. She is not diaphoretic. No erythema. Nails show no clubbing.  Psychiatric: Mood and affect normal.    Diagnostics:    Spirometry was performed and demonstrated an FEV1 of 1.21 at 56 % of predicted. She had a less than optimal effort on the spirometric maneuver  The patient had an Asthma Control Test with the following results: ACT Total Score: 18.    Assessment and Plan:   1. Asthma, moderate persistent, well-controlled   2. Sarcoidosis of lung ([place])   3. Other allergic rhinitis   4. History of chronic sinusitis   5. Migraine syndrome   6. LPRD (laryngopharyngeal reflux disease)     1. Continue to perform Allergen avoidance measures  2. Continue to Treat and prevent inflammation:   A. montelukast 10 mg daily  B. Symbicort 160 2 inhalations 2 times a day w/ spacer  C. Azelastine 1-2 sprays each nostril twice a day   3. Continue toTreat and prevent reflux:   A. Dexilant 60mg  one tablet in a.m.   B. ranitidine 300 mg one tablet in PM  C. Continue off all forms of caffeine including chocolate   4. Continue to Treat and prevent headache:   A. Periactin 4 mg tablet 1 - 2 tablet at bedtime  5. If needed:   A. albuterol MDI 2 puffs every  4-6 hours  B. cetirizine 10 mg one time per day  C. Nasal saline multiple times per day  D. OTC Mucinex   6. Return to clinic in 12 weeks or earlier if problem  7. Obtain fall flu vaccine  Danniel has continued to show improvement on her current medical plan and I would like for her to remain on a large collection of medical therapy directed against respiratory tract inflammation and reflux and cephalgia at this point. If she continues to do well over the course of the next 12 weeks there may be an opportunity to taper some of her medications at that point. She will contact me should there be any significant problem during the interval.  Allena Katz, MD [facility]

## 2016-08-24 DIAGNOSIS — M6281 Muscle weakness (generalized): Secondary | ICD-10-CM | POA: Diagnosis not present

## 2016-08-24 DIAGNOSIS — R42 Dizziness and giddiness: Secondary | ICD-10-CM | POA: Diagnosis not present

## 2016-08-24 DIAGNOSIS — R2689 Other abnormalities of gait and mobility: Secondary | ICD-10-CM | POA: Diagnosis not present

## 2016-08-27 DIAGNOSIS — R2689 Other abnormalities of gait and mobility: Secondary | ICD-10-CM | POA: Diagnosis not present

## 2016-08-27 DIAGNOSIS — R42 Dizziness and giddiness: Secondary | ICD-10-CM | POA: Diagnosis not present

## 2016-08-27 DIAGNOSIS — M6281 Muscle weakness (generalized): Secondary | ICD-10-CM | POA: Diagnosis not present

## 2016-08-28 DIAGNOSIS — D860 Sarcoidosis of lung: Secondary | ICD-10-CM | POA: Diagnosis not present

## 2016-08-28 DIAGNOSIS — Z79899 Other long term (current) drug therapy: Secondary | ICD-10-CM | POA: Diagnosis not present

## 2016-08-28 DIAGNOSIS — E1149 Type 2 diabetes mellitus with other diabetic neurological complication: Secondary | ICD-10-CM | POA: Diagnosis not present

## 2016-08-28 DIAGNOSIS — R29898 Other symptoms and signs involving the musculoskeletal system: Secondary | ICD-10-CM | POA: Diagnosis not present

## 2016-08-28 DIAGNOSIS — J309 Allergic rhinitis, unspecified: Secondary | ICD-10-CM | POA: Diagnosis not present

## 2016-08-28 DIAGNOSIS — R5383 Other fatigue: Secondary | ICD-10-CM | POA: Diagnosis not present

## 2016-08-28 DIAGNOSIS — I10 Essential (primary) hypertension: Secondary | ICD-10-CM | POA: Diagnosis not present

## 2016-08-28 DIAGNOSIS — E785 Hyperlipidemia, unspecified: Secondary | ICD-10-CM | POA: Diagnosis not present

## 2016-08-28 DIAGNOSIS — G4733 Obstructive sleep apnea (adult) (pediatric): Secondary | ICD-10-CM | POA: Diagnosis not present

## 2016-08-28 DIAGNOSIS — J301 Allergic rhinitis due to pollen: Secondary | ICD-10-CM | POA: Diagnosis not present

## 2016-08-28 DIAGNOSIS — E559 Vitamin D deficiency, unspecified: Secondary | ICD-10-CM | POA: Diagnosis not present

## 2016-08-28 DIAGNOSIS — Z6841 Body Mass Index (BMI) 40.0 and over, adult: Secondary | ICD-10-CM | POA: Diagnosis not present

## 2016-08-28 DIAGNOSIS — R918 Other nonspecific abnormal finding of lung field: Secondary | ICD-10-CM | POA: Diagnosis not present

## 2016-08-28 DIAGNOSIS — E039 Hypothyroidism, unspecified: Secondary | ICD-10-CM | POA: Diagnosis not present

## 2016-08-31 DIAGNOSIS — M545 Low back pain: Secondary | ICD-10-CM | POA: Diagnosis not present

## 2016-08-31 DIAGNOSIS — M5030 Other cervical disc degeneration, unspecified cervical region: Secondary | ICD-10-CM | POA: Diagnosis not present

## 2016-08-31 DIAGNOSIS — R2689 Other abnormalities of gait and mobility: Secondary | ICD-10-CM | POA: Diagnosis not present

## 2016-08-31 DIAGNOSIS — R42 Dizziness and giddiness: Secondary | ICD-10-CM | POA: Diagnosis not present

## 2016-08-31 DIAGNOSIS — M5136 Other intervertebral disc degeneration, lumbar region: Secondary | ICD-10-CM | POA: Diagnosis not present

## 2016-08-31 DIAGNOSIS — M50322 Other cervical disc degeneration at C5-C6 level: Secondary | ICD-10-CM | POA: Diagnosis not present

## 2016-08-31 DIAGNOSIS — M6281 Muscle weakness (generalized): Secondary | ICD-10-CM | POA: Diagnosis not present

## 2016-09-01 DIAGNOSIS — G4733 Obstructive sleep apnea (adult) (pediatric): Secondary | ICD-10-CM | POA: Diagnosis not present

## 2016-09-02 DIAGNOSIS — M9903 Segmental and somatic dysfunction of lumbar region: Secondary | ICD-10-CM | POA: Diagnosis not present

## 2016-09-02 DIAGNOSIS — N3000 Acute cystitis without hematuria: Secondary | ICD-10-CM | POA: Diagnosis not present

## 2016-09-02 DIAGNOSIS — M5136 Other intervertebral disc degeneration, lumbar region: Secondary | ICD-10-CM | POA: Diagnosis not present

## 2016-09-02 DIAGNOSIS — M5442 Lumbago with sciatica, left side: Secondary | ICD-10-CM | POA: Diagnosis not present

## 2016-09-02 DIAGNOSIS — M5137 Other intervertebral disc degeneration, lumbosacral region: Secondary | ICD-10-CM | POA: Diagnosis not present

## 2016-09-02 DIAGNOSIS — M9904 Segmental and somatic dysfunction of sacral region: Secondary | ICD-10-CM | POA: Diagnosis not present

## 2016-09-02 DIAGNOSIS — M50323 Other cervical disc degeneration at C6-C7 level: Secondary | ICD-10-CM | POA: Diagnosis not present

## 2016-09-02 DIAGNOSIS — Z6841 Body Mass Index (BMI) 40.0 and over, adult: Secondary | ICD-10-CM | POA: Diagnosis not present

## 2016-09-02 DIAGNOSIS — M5413 Radiculopathy, cervicothoracic region: Secondary | ICD-10-CM | POA: Diagnosis not present

## 2016-09-02 DIAGNOSIS — M9901 Segmental and somatic dysfunction of cervical region: Secondary | ICD-10-CM | POA: Diagnosis not present

## 2016-09-03 DIAGNOSIS — R42 Dizziness and giddiness: Secondary | ICD-10-CM | POA: Diagnosis not present

## 2016-09-03 DIAGNOSIS — M6281 Muscle weakness (generalized): Secondary | ICD-10-CM | POA: Diagnosis not present

## 2016-09-03 DIAGNOSIS — R2689 Other abnormalities of gait and mobility: Secondary | ICD-10-CM | POA: Diagnosis not present

## 2016-09-04 ENCOUNTER — Other Ambulatory Visit: Payer: Self-pay | Admitting: *Deleted

## 2016-09-04 MED ORDER — RANITIDINE HCL 300 MG PO TABS: ORAL_TABLET | ORAL | 1 refills | Status: DC

## 2016-09-08 ENCOUNTER — Other Ambulatory Visit: Payer: Self-pay | Admitting: Allergy and Immunology

## 2016-09-09 DIAGNOSIS — M6281 Muscle weakness (generalized): Secondary | ICD-10-CM | POA: Diagnosis not present

## 2016-09-09 DIAGNOSIS — R2689 Other abnormalities of gait and mobility: Secondary | ICD-10-CM | POA: Diagnosis not present

## 2016-09-09 DIAGNOSIS — R42 Dizziness and giddiness: Secondary | ICD-10-CM | POA: Diagnosis not present

## 2016-09-11 DIAGNOSIS — R42 Dizziness and giddiness: Secondary | ICD-10-CM | POA: Diagnosis not present

## 2016-09-11 DIAGNOSIS — R2689 Other abnormalities of gait and mobility: Secondary | ICD-10-CM | POA: Diagnosis not present

## 2016-09-11 DIAGNOSIS — M6281 Muscle weakness (generalized): Secondary | ICD-10-CM | POA: Diagnosis not present

## 2016-09-14 DIAGNOSIS — R2689 Other abnormalities of gait and mobility: Secondary | ICD-10-CM | POA: Diagnosis not present

## 2016-09-14 DIAGNOSIS — R42 Dizziness and giddiness: Secondary | ICD-10-CM | POA: Diagnosis not present

## 2016-09-14 DIAGNOSIS — M6281 Muscle weakness (generalized): Secondary | ICD-10-CM | POA: Diagnosis not present

## 2016-09-24 DIAGNOSIS — R42 Dizziness and giddiness: Secondary | ICD-10-CM | POA: Diagnosis not present

## 2016-09-24 DIAGNOSIS — R2689 Other abnormalities of gait and mobility: Secondary | ICD-10-CM | POA: Diagnosis not present

## 2016-09-24 DIAGNOSIS — M6281 Muscle weakness (generalized): Secondary | ICD-10-CM | POA: Diagnosis not present

## 2016-09-28 DIAGNOSIS — R2689 Other abnormalities of gait and mobility: Secondary | ICD-10-CM | POA: Diagnosis not present

## 2016-09-28 DIAGNOSIS — R42 Dizziness and giddiness: Secondary | ICD-10-CM | POA: Diagnosis not present

## 2016-09-28 DIAGNOSIS — M6281 Muscle weakness (generalized): Secondary | ICD-10-CM | POA: Diagnosis not present

## 2016-10-01 DIAGNOSIS — M6281 Muscle weakness (generalized): Secondary | ICD-10-CM | POA: Diagnosis not present

## 2016-10-01 DIAGNOSIS — R42 Dizziness and giddiness: Secondary | ICD-10-CM | POA: Diagnosis not present

## 2016-10-01 DIAGNOSIS — R2689 Other abnormalities of gait and mobility: Secondary | ICD-10-CM | POA: Diagnosis not present

## 2016-10-02 DIAGNOSIS — M7741 Metatarsalgia, right foot: Secondary | ICD-10-CM | POA: Diagnosis not present

## 2016-10-02 DIAGNOSIS — M2041 Other hammer toe(s) (acquired), right foot: Secondary | ICD-10-CM | POA: Diagnosis not present

## 2016-10-02 DIAGNOSIS — E1142 Type 2 diabetes mellitus with diabetic polyneuropathy: Secondary | ICD-10-CM | POA: Diagnosis not present

## 2016-10-02 DIAGNOSIS — M216X1 Other acquired deformities of right foot: Secondary | ICD-10-CM | POA: Diagnosis not present

## 2016-10-02 DIAGNOSIS — Z1231 Encounter for screening mammogram for malignant neoplasm of breast: Secondary | ICD-10-CM | POA: Diagnosis not present

## 2016-10-05 DIAGNOSIS — R2689 Other abnormalities of gait and mobility: Secondary | ICD-10-CM | POA: Diagnosis not present

## 2016-10-05 DIAGNOSIS — R42 Dizziness and giddiness: Secondary | ICD-10-CM | POA: Diagnosis not present

## 2016-10-05 DIAGNOSIS — M6281 Muscle weakness (generalized): Secondary | ICD-10-CM | POA: Diagnosis not present

## 2016-10-07 DIAGNOSIS — Z6841 Body Mass Index (BMI) 40.0 and over, adult: Secondary | ICD-10-CM | POA: Diagnosis not present

## 2016-10-07 DIAGNOSIS — E2839 Other primary ovarian failure: Secondary | ICD-10-CM | POA: Diagnosis not present

## 2016-10-07 DIAGNOSIS — L989 Disorder of the skin and subcutaneous tissue, unspecified: Secondary | ICD-10-CM | POA: Diagnosis not present

## 2016-10-16 DIAGNOSIS — M5412 Radiculopathy, cervical region: Secondary | ICD-10-CM | POA: Diagnosis not present

## 2016-10-16 DIAGNOSIS — G5621 Lesion of ulnar nerve, right upper limb: Secondary | ICD-10-CM | POA: Diagnosis not present

## 2016-10-16 DIAGNOSIS — G5601 Carpal tunnel syndrome, right upper limb: Secondary | ICD-10-CM | POA: Diagnosis not present

## 2016-10-20 DIAGNOSIS — Z1389 Encounter for screening for other disorder: Secondary | ICD-10-CM | POA: Diagnosis not present

## 2016-10-20 DIAGNOSIS — Z136 Encounter for screening for cardiovascular disorders: Secondary | ICD-10-CM | POA: Diagnosis not present

## 2016-10-20 DIAGNOSIS — Z6841 Body Mass Index (BMI) 40.0 and over, adult: Secondary | ICD-10-CM | POA: Diagnosis not present

## 2016-10-20 DIAGNOSIS — N959 Unspecified menopausal and perimenopausal disorder: Secondary | ICD-10-CM | POA: Diagnosis not present

## 2016-10-20 DIAGNOSIS — Z9181 History of falling: Secondary | ICD-10-CM | POA: Diagnosis not present

## 2016-10-20 DIAGNOSIS — Z0000 Encounter for general adult medical examination without abnormal findings: Secondary | ICD-10-CM | POA: Diagnosis not present

## 2016-10-20 DIAGNOSIS — E669 Obesity, unspecified: Secondary | ICD-10-CM | POA: Diagnosis not present

## 2016-10-20 DIAGNOSIS — E785 Hyperlipidemia, unspecified: Secondary | ICD-10-CM | POA: Diagnosis not present

## 2016-10-26 DIAGNOSIS — G4733 Obstructive sleep apnea (adult) (pediatric): Secondary | ICD-10-CM | POA: Diagnosis not present

## 2016-10-27 DIAGNOSIS — M50822 Other cervical disc disorders at C5-C6 level: Secondary | ICD-10-CM | POA: Diagnosis not present

## 2016-10-27 DIAGNOSIS — G894 Chronic pain syndrome: Secondary | ICD-10-CM | POA: Diagnosis not present

## 2016-10-27 DIAGNOSIS — M50821 Other cervical disc disorders at C4-C5 level: Secondary | ICD-10-CM | POA: Diagnosis not present

## 2016-10-27 DIAGNOSIS — M50823 Other cervical disc disorders at C6-C7 level: Secondary | ICD-10-CM | POA: Diagnosis not present

## 2016-11-04 DIAGNOSIS — J069 Acute upper respiratory infection, unspecified: Secondary | ICD-10-CM | POA: Diagnosis not present

## 2016-11-04 DIAGNOSIS — Z6841 Body Mass Index (BMI) 40.0 and over, adult: Secondary | ICD-10-CM | POA: Diagnosis not present

## 2016-11-12 DIAGNOSIS — M50823 Other cervical disc disorders at C6-C7 level: Secondary | ICD-10-CM | POA: Diagnosis not present

## 2016-11-12 DIAGNOSIS — M542 Cervicalgia: Secondary | ICD-10-CM | POA: Diagnosis not present

## 2016-11-13 DIAGNOSIS — M216X1 Other acquired deformities of right foot: Secondary | ICD-10-CM | POA: Diagnosis not present

## 2016-11-13 DIAGNOSIS — M7741 Metatarsalgia, right foot: Secondary | ICD-10-CM | POA: Diagnosis not present

## 2016-11-13 DIAGNOSIS — E1142 Type 2 diabetes mellitus with diabetic polyneuropathy: Secondary | ICD-10-CM | POA: Diagnosis not present

## 2016-11-16 ENCOUNTER — Ambulatory Visit: Payer: Medicare HMO | Admitting: Allergy and Immunology

## 2016-11-20 DIAGNOSIS — Z13820 Encounter for screening for osteoporosis: Secondary | ICD-10-CM | POA: Diagnosis not present

## 2016-11-20 DIAGNOSIS — E2839 Other primary ovarian failure: Secondary | ICD-10-CM | POA: Diagnosis not present

## 2016-11-24 DIAGNOSIS — Z6841 Body Mass Index (BMI) 40.0 and over, adult: Secondary | ICD-10-CM | POA: Diagnosis not present

## 2016-11-24 DIAGNOSIS — G629 Polyneuropathy, unspecified: Secondary | ICD-10-CM | POA: Diagnosis not present

## 2016-11-24 DIAGNOSIS — E1149 Type 2 diabetes mellitus with other diabetic neurological complication: Secondary | ICD-10-CM | POA: Diagnosis not present

## 2016-11-30 DIAGNOSIS — J309 Allergic rhinitis, unspecified: Secondary | ICD-10-CM | POA: Diagnosis not present

## 2016-11-30 DIAGNOSIS — B079 Viral wart, unspecified: Secondary | ICD-10-CM | POA: Diagnosis not present

## 2016-11-30 DIAGNOSIS — Z79899 Other long term (current) drug therapy: Secondary | ICD-10-CM | POA: Diagnosis not present

## 2016-11-30 DIAGNOSIS — J069 Acute upper respiratory infection, unspecified: Secondary | ICD-10-CM | POA: Diagnosis not present

## 2016-11-30 DIAGNOSIS — E1149 Type 2 diabetes mellitus with other diabetic neurological complication: Secondary | ICD-10-CM | POA: Diagnosis not present

## 2016-11-30 DIAGNOSIS — E039 Hypothyroidism, unspecified: Secondary | ICD-10-CM | POA: Diagnosis not present

## 2016-11-30 DIAGNOSIS — E559 Vitamin D deficiency, unspecified: Secondary | ICD-10-CM | POA: Diagnosis not present

## 2016-11-30 DIAGNOSIS — E785 Hyperlipidemia, unspecified: Secondary | ICD-10-CM | POA: Diagnosis not present

## 2016-12-10 DIAGNOSIS — M50821 Other cervical disc disorders at C4-C5 level: Secondary | ICD-10-CM | POA: Diagnosis not present

## 2016-12-10 DIAGNOSIS — M542 Cervicalgia: Secondary | ICD-10-CM | POA: Diagnosis not present

## 2016-12-10 DIAGNOSIS — M50823 Other cervical disc disorders at C6-C7 level: Secondary | ICD-10-CM | POA: Diagnosis not present

## 2016-12-10 DIAGNOSIS — M50822 Other cervical disc disorders at C5-C6 level: Secondary | ICD-10-CM | POA: Diagnosis not present

## 2016-12-15 DIAGNOSIS — G4733 Obstructive sleep apnea (adult) (pediatric): Secondary | ICD-10-CM | POA: Diagnosis not present

## 2016-12-18 DIAGNOSIS — L600 Ingrowing nail: Secondary | ICD-10-CM | POA: Diagnosis not present

## 2016-12-18 DIAGNOSIS — Z6841 Body Mass Index (BMI) 40.0 and over, adult: Secondary | ICD-10-CM | POA: Diagnosis not present

## 2016-12-18 DIAGNOSIS — Z9013 Acquired absence of bilateral breasts and nipples: Secondary | ICD-10-CM | POA: Diagnosis not present

## 2016-12-31 DIAGNOSIS — G5600 Carpal tunnel syndrome, unspecified upper limb: Secondary | ICD-10-CM | POA: Diagnosis not present

## 2016-12-31 DIAGNOSIS — D860 Sarcoidosis of lung: Secondary | ICD-10-CM | POA: Diagnosis not present

## 2016-12-31 DIAGNOSIS — E1149 Type 2 diabetes mellitus with other diabetic neurological complication: Secondary | ICD-10-CM | POA: Diagnosis not present

## 2016-12-31 DIAGNOSIS — G629 Polyneuropathy, unspecified: Secondary | ICD-10-CM | POA: Diagnosis not present

## 2016-12-31 DIAGNOSIS — M549 Dorsalgia, unspecified: Secondary | ICD-10-CM | POA: Diagnosis not present

## 2017-01-08 DIAGNOSIS — D860 Sarcoidosis of lung: Secondary | ICD-10-CM | POA: Diagnosis not present

## 2017-01-08 DIAGNOSIS — J309 Allergic rhinitis, unspecified: Secondary | ICD-10-CM | POA: Diagnosis not present

## 2017-01-08 DIAGNOSIS — R918 Other nonspecific abnormal finding of lung field: Secondary | ICD-10-CM | POA: Diagnosis not present

## 2017-01-08 DIAGNOSIS — G4733 Obstructive sleep apnea (adult) (pediatric): Secondary | ICD-10-CM | POA: Diagnosis not present

## 2017-01-12 ENCOUNTER — Ambulatory Visit (INDEPENDENT_AMBULATORY_CARE_PROVIDER_SITE_OTHER): Payer: Medicare HMO | Admitting: Neurology

## 2017-01-12 ENCOUNTER — Encounter: Payer: Self-pay | Admitting: Neurology

## 2017-01-12 VITALS — BP 126/74 | HR 71 | Ht 60.0 in | Wt 216.0 lb

## 2017-01-12 DIAGNOSIS — G44219 Episodic tension-type headache, not intractable: Secondary | ICD-10-CM | POA: Diagnosis not present

## 2017-01-12 DIAGNOSIS — E0842 Diabetes mellitus due to underlying condition with diabetic polyneuropathy: Secondary | ICD-10-CM | POA: Diagnosis not present

## 2017-01-12 DIAGNOSIS — R404 Transient alteration of awareness: Secondary | ICD-10-CM

## 2017-01-12 MED ORDER — PREGABALIN 100 MG PO CAPS: 100.0000 mg | ORAL_CAPSULE | Freq: Two times a day (BID) | ORAL | 3 refills | Status: DC

## 2017-01-12 NOTE — Patient Instructions (Signed)
Great seeing you! 1. Reduce Lyrica to 100mg  twice a day 2. Use cane for balance when not using the wheelchair 3. Follow-up in 6 months, call for any changes

## 2017-01-12 NOTE — Progress Notes (Signed)
NEUROLOGY FOLLOW UP OFFICE NOTE  Melanie Meyers [id number]  DOB: [birth year]  HISTORY OF PRESENT ILLNESS: I had the pleasure of seeing Melanie Meyers in follow-up in the neurology clinic on 01/12/2017.  The patient was last seen 6 months ago. She had initially presented after an episode of loss of awareness last July 2017. MRI brain with and without contrast which did not show any acute changes, hippocampi symmetric with no abnormal signal or enhancement seen. Her 1-hour wake and sleep EEG was normal. Single-lead EKG showed sinus bradycardia. No further similar episodes in over a year. She was also reporting numbness and tingling in both arms and legs, EMG/NCV of the right UE and LE showed no evidence of large fiber neuropathy, there was right median neuropathy (carpal tunnel syndrome), mild in degree. She had carpal tunnel surgery in April 2018. She was also reporting pain in her chest, underarm, and different places in her arm, she has a prior diagnosis of fibromyalgia. She takes Lyrica for neuropathy (had side effects on gabapentin). She was reporting several falls, likely due to neuropathy, and was referred for balance therapy.  Since her last visit, she reports that she is feeling the best she has had in several years. She is off her antidepressant and off metoprolol, her A1c is 5.6, and she has lost weight. She did balance therapy and feels that it did help. She was given home exercises to do. She was also prescribed an electric wheelchair, but has only had it a few days. She reports 3 falls in December, she got tripped twice, then one time was walking then just fell. The headaches are not bothering her too much. She expressed interest in tapering down Lyrica.   HPI 10/09/15: This is a 62 yo RH woman with a history of hypertension, diabetes, sleep apnea on CPAP, sarcoidosis, B12 deficiency, hypothyroidism, anxiety, migraines, with a transient episode of loss of awareness last 07/24/2015. She  recalls sitting in the car with her daughter then suddenly feeling sleepy. She recalls closing her eyes, then waking up in the ER. She then went to [facility] ER, records reviewed. Her other daughter witnessed her head bobbing back and forth, she was not responding to questions although she was reported to be awake. She was told her face was droopy and her speech was slurred when she asked her daughter why she was being taken to the ER (she does not recall this). They stopped on the road and she got out of the car, then fell backwards, no injuries. They went to a different ER but left due to a long wait, then went to [facility] where she was back to baseline. The episode lasted 45 minutes. She reported a mild headache with photophobia all day prior to the event, and was mildly nauseated in the car. She reported feeling mildly numb on her left arm, but today denies any focal symptoms. Bloodwork showed unremarkable CBC, CMP, negative ethanol level. I personally reviewed head CT without contrast which did not show any acute changes. She denies any further episodes since then, no history of similar episodes in the past.  She and her daughter deny any other staring/unresponsive episodes or gaps in time, no olfactory/gustatory hallucinations, deja vu, rising epigastric sensation, focal numbness/tingling/weakness, myoclonic jerks. She has a history of migraines that used to respond to Relpax, however over the past 3-4 months, she has been having a constant throbbing headache over the vertex and occipital regions, waxing and waning in intensity, with associated photosensitivity,  no nausea/vomiting. Imitrex does not help as much as the Relpax, however insurance will not cover Relpax. She has occasional dizziness described as lightheadedness and occasional spinning, lasting a few seconds, which can occur with any position. She has chronic neck and back pain. She has a history of neuropathy with numbness and tingling in both arms and  legs, she states the neuropathy in her arms are due to B12 deficiency, and due to diabetic neuropathy in her legs. Her last HbA1c was 6.1. She is taking Lyrica 75mg  BID. On higher dose (150mg  BID), she was told by her son she had psychiatric problems, which hurt her. She does not see Psychiatry. She reports unrefreshing sleep despite use of CPAP machine. She has been forgetful for the past few months, she forgets words. She drives and denies getting lost driving. She lives with her daughter and denies any missed bill payments or missed medications. Her mother and maternal aunt had dementia. She reports starting Paxil a week before the episode, this was switched to Abilify 2 weeks ago.   She had a normal birth and early development.  There is no history of febrile convulsions, CNS infections such as meningitis/encephalitis, significant traumatic brain injury, neurosurgical procedures, or family history of seizures.    PAST MEDICAL HISTORY: Past Medical History:  Diagnosis Date  . Asthma   . Degenerative arthritis    degenerative arthritis of neck  . Depression   . Diabetes mellitus without complication ([place])   . Fibromyalgia   . GERD (gastroesophageal reflux disease)   . High cholesterol   . Hypertension   . Migraine   . Neuropathy   . Sarcoidosis   . Sleep apnea   . Thyroid disease   . Vitamin D deficiency     MEDICATIONS: Current Outpatient Medications on File Prior to Visit  Medication Sig Dispense Refill  . ACCU-CHEK AVIVA PLUS test strip 1 each by Other route as needed (for BS).     Marland Kitchen albuterol (VENTOLIN HFA) 108 (90 Base) MCG/ACT inhaler Inhale two puffs every four to six hours as needed for cough or wheeze.    Marland Kitchen AMBULATORY NON FORMULARY MEDICATION 1 Units by Other route daily. Wear right wrist splint daily. 1 Units 0  . aspirin EC 81 MG tablet Take 81 mg by mouth at bedtime.     Marland Kitchen azelastine (ASTELIN) 0.1 % nasal spray Place 2 sprays into both nostrils 2 (two) times daily. Use  in each nostril as directed    . BIOTIN PO Take 5,000 mcg by mouth at bedtime.     . budesonide-formoterol (SYMBICORT) 160-4.5 MCG/ACT inhaler Inhale two puffs twice daily with spacer to prevent cough or wheeze.  Rinse, gargle, and spit after use. 1 Inhaler 5  . cetirizine (ZYRTEC) 10 MG tablet Take 10 mg by mouth at bedtime.     . cyproheptadine (PERIACTIN) 4 MG tablet Take 2 tablets at bedtime 180 tablet 3  . DEXILANT 60 MG capsule TAKE ONE CAPSULE EACH MORNING AS DIRECTED 30 capsule 5  . levothyroxine (SYNTHROID, LEVOTHROID) 25 MCG tablet Take 25 mcg by mouth daily before breakfast.    . losartan (COZAAR) 25 MG tablet Take 25 mg by mouth at bedtime.     Marland Kitchen MAGNESIUM PO Take by mouth daily.    . metFORMIN (GLUCOPHAGE) 1000 MG tablet Take 1,000 mg by mouth 2 (two) times daily with a meal.     . metoprolol tartrate (LOPRESSOR) 25 MG tablet Take 25 mg by mouth 2 (two)  times daily.    . montelukast (SINGULAIR) 10 MG tablet Take 10 mg by mouth at bedtime.     . naproxen (NAPROSYN) 500 MG tablet Take 500 mg by mouth 2 (two) times daily with a meal.     . Olopatadine HCl (PATADAY) 0.2 % SOLN Apply 1 drop to eye daily as needed (for dry eyes).     . polyethylene glycol (MIRALAX / GLYCOLAX) packet Take 17 g by mouth daily with breakfast.     . pravastatin (PRAVACHOL) 40 MG tablet Take 40 mg by mouth at bedtime.    . pregabalin (LYRICA) 150 MG capsule Take 1 cap in AM, 2 caps in PM 90 capsule 5  . ranitidine (ZANTAC) 300 MG tablet Take one tablet at bedtime 90 tablet 1  . rizatriptan (MAXALT) 10 MG tablet     . silver sulfADIAZINE (SILVADENE) 1 % cream Apply 1 application topically 2 (two) times daily.    . SOLIQUA 100-33 UNT-MCG/ML SOPN 30 Units every morning.     Marland Kitchen UNIFINE PENTIPS 31G X 6 MM MISC     . zolpidem (AMBIEN) 10 MG tablet Take 10 mg by mouth at bedtime.      Current Facility-Administered Medications on File Prior to Visit  Medication Dose Route Frequency Provider Last Rate Last Dose  .  triamcinolone acetonide (KENALOG) 10 MG/ML injection 10 mg  10 mg Other Once Landis Martins, DPM        ALLERGIES: Allergies  Allergen Reactions  . Accupril [Quinapril Hcl] Cough  . Neurontin [Gabapentin] Other (See Comments)    Didn't work  . Prednisone Other (See Comments)    Has to be cautious due to BS  . Topamax [Topiramate] Other (See Comments)    Didn't work  . Levaquin [Levofloxacin] Rash    FAMILY HISTORY: Family History  Problem Relation Age of Onset  . Parkinson's disease Mother   . Diabetes Mother   . Alzheimer's disease Mother   . Gout Mother   . High blood pressure Father   . High Cholesterol Father   . Heart attack Father   . Thyroid disease Sister   . Heart attack Sister   . Thyroid disease Sister   . Thyroid disease Sister     SOCIAL HISTORY: Social History   Socioeconomic History  . Marital status: Widowed    Spouse name: Not on file  . Number of children: Not on file  . Years of education: Not on file  . Highest education level: Not on file  Social Needs  . Financial resource strain: Not on file  . Food insecurity - worry: Not on file  . Food insecurity - inability: Not on file  . Transportation needs - medical: Not on file  . Transportation needs - non-medical: Not on file  Occupational History  . Not on file  Tobacco Use  . Smoking status: Never Smoker  . Smokeless tobacco: Never Used  Substance and Sexual Activity  . Alcohol use: No    Alcohol/week: 0.0 oz  . Drug use: No  . Sexual activity: Not on file  Other Topics Concern  . Not on file  Social History Narrative  . Not on file    REVIEW OF SYSTEMS: Constitutional: No fevers, chills, or sweats, no generalized fatigue, change in appetite Eyes: No visual changes, double vision, eye pain Ear, nose and throat: No hearing loss, ear pain, nasal congestion, sore throat Cardiovascular: No chest pain, palpitations Respiratory:  No shortness of breath at rest  or with exertion,  wheezes GastrointestinaI: No nausea, vomiting, diarrhea, abdominal pain, fecal incontinence Genitourinary:  No dysuria, urinary retention or frequency Musculoskeletal:  No neck pain,+ back pain Integumentary: No rash, pruritus, skin lesions Neurological: as above Psychiatric: + depression, no insomnia, anxiety Endocrine: No palpitations, fatigue, diaphoresis, mood swings, change in appetite, change in weight, increased thirst Hematologic/Lymphatic:  No anemia, purpura, petechiae. Allergic/Immunologic: no itchy/runny eyes, nasal congestion, recent allergic reactions, rashes  PHYSICAL EXAM: Vitals:   01/12/17 0844  BP: 126/74  Pulse: 71  SpO2: 97%   General: No acute distress Head:  Normocephalic/atraumatic Neck: supple, no paraspinal tenderness, full range of motion Heart:  Regular rate and rhythm Lungs:  Clear to auscultation bilaterally Back: No paraspinal tenderness Skin/Extremities: No rash, no edema Neurological Exam: alert and oriented to person, place, and time. No aphasia or dysarthria. Fund of knowledge is appropriate.  Recent and remote memory are intact.  Attention and concentration are normal.    Able to name objects and repeat phrases. Cranial nerves: Pupils equal, round, reactive to light.  Extraocular movements intact with no nystagmus. Visual fields full. Facial sensation intact. No facial asymmetry. Tongue, uvula, palate midline.  Motor: Bulk and tone normal, muscle strength 5/5 throughout with no pronator drift.  Sensation to light touch intact.  No extinction to double simultaneous stimulation.  Deep tendon reflexes +1 throughout, toes downgoing.  Finger to nose testing intact.  Gait narrow-based and steady, difficulty with tandem walk (similar to prior).  Romberg positive.  IMPRESSION: This is a 61 yo RH woman with a history of history of hypertension, diabetes, sleep apnea on CPAP, sarcoidosis, B12 deficiency, hypothyroidism, anxiety, migraines, who had a transient  episode of loss of awareness last 07/24/2015.The etiology of the episode is unclear, MRI brain and 1-hour EEG were normal. No further episodes in more than a year. She was also reporting headaches, which are not bothersome for her currently. She is taking Lyrica for fibromyalgia and expressed interest in reducing dose, she will reduce to 100mg  BID and monitor symptoms. She has had a few falls likely due to neuropathy, continue with HEP balance therapy, she was advised to use a cane regularly, she has a motorized wheelchair for longer distances. She will follow-up in 6 months and knows to call for any changes.   Thank you for allowing me to participate in her care.  Please do not hesitate to call for any questions or concerns.  The duration of this appointment visit was 25 minutes of face-to-face time with the patient.  Greater than 50% of this time was spent in counseling, explanation of diagnosis, planning of further management, and coordination of care.   Melanie Meyers, M.D.   CC: Dr. Rica Records

## 2017-01-14 ENCOUNTER — Encounter: Payer: Self-pay | Admitting: Neurology

## 2017-01-22 DIAGNOSIS — B079 Viral wart, unspecified: Secondary | ICD-10-CM | POA: Diagnosis not present

## 2017-01-26 DIAGNOSIS — G4733 Obstructive sleep apnea (adult) (pediatric): Secondary | ICD-10-CM | POA: Diagnosis not present

## 2017-01-27 ENCOUNTER — Other Ambulatory Visit: Payer: Self-pay

## 2017-01-27 MED ORDER — RANITIDINE HCL 300 MG PO TABS: 300.0000 mg | ORAL_TABLET | Freq: Every day | ORAL | 0 refills | Status: DC

## 2017-01-29 DIAGNOSIS — D485 Neoplasm of uncertain behavior of skin: Secondary | ICD-10-CM | POA: Diagnosis not present

## 2017-01-29 DIAGNOSIS — L578 Other skin changes due to chronic exposure to nonionizing radiation: Secondary | ICD-10-CM | POA: Diagnosis not present

## 2017-01-31 DIAGNOSIS — E1149 Type 2 diabetes mellitus with other diabetic neurological complication: Secondary | ICD-10-CM | POA: Diagnosis not present

## 2017-01-31 DIAGNOSIS — G5600 Carpal tunnel syndrome, unspecified upper limb: Secondary | ICD-10-CM | POA: Diagnosis not present

## 2017-01-31 DIAGNOSIS — G629 Polyneuropathy, unspecified: Secondary | ICD-10-CM | POA: Diagnosis not present

## 2017-01-31 DIAGNOSIS — D860 Sarcoidosis of lung: Secondary | ICD-10-CM | POA: Diagnosis not present

## 2017-01-31 DIAGNOSIS — M549 Dorsalgia, unspecified: Secondary | ICD-10-CM | POA: Diagnosis not present

## 2017-02-10 DIAGNOSIS — M6740 Ganglion, unspecified site: Secondary | ICD-10-CM | POA: Diagnosis not present

## 2017-02-10 DIAGNOSIS — M19041 Primary osteoarthritis, right hand: Secondary | ICD-10-CM | POA: Insufficient documentation

## 2017-02-19 DIAGNOSIS — M5412 Radiculopathy, cervical region: Secondary | ICD-10-CM | POA: Diagnosis not present

## 2017-03-03 DIAGNOSIS — G5600 Carpal tunnel syndrome, unspecified upper limb: Secondary | ICD-10-CM | POA: Diagnosis not present

## 2017-03-03 DIAGNOSIS — G629 Polyneuropathy, unspecified: Secondary | ICD-10-CM | POA: Diagnosis not present

## 2017-03-03 DIAGNOSIS — D860 Sarcoidosis of lung: Secondary | ICD-10-CM | POA: Diagnosis not present

## 2017-03-03 DIAGNOSIS — E1149 Type 2 diabetes mellitus with other diabetic neurological complication: Secondary | ICD-10-CM | POA: Diagnosis not present

## 2017-03-03 DIAGNOSIS — M549 Dorsalgia, unspecified: Secondary | ICD-10-CM | POA: Diagnosis not present

## 2017-03-04 DIAGNOSIS — M5412 Radiculopathy, cervical region: Secondary | ICD-10-CM | POA: Diagnosis not present

## 2017-03-24 DIAGNOSIS — F329 Major depressive disorder, single episode, unspecified: Secondary | ICD-10-CM | POA: Diagnosis not present

## 2017-03-24 DIAGNOSIS — Z6841 Body Mass Index (BMI) 40.0 and over, adult: Secondary | ICD-10-CM | POA: Diagnosis not present

## 2017-03-24 DIAGNOSIS — E785 Hyperlipidemia, unspecified: Secondary | ICD-10-CM | POA: Diagnosis not present

## 2017-03-24 DIAGNOSIS — E559 Vitamin D deficiency, unspecified: Secondary | ICD-10-CM | POA: Diagnosis not present

## 2017-03-24 DIAGNOSIS — J309 Allergic rhinitis, unspecified: Secondary | ICD-10-CM | POA: Diagnosis not present

## 2017-03-24 DIAGNOSIS — M19041 Primary osteoarthritis, right hand: Secondary | ICD-10-CM | POA: Diagnosis not present

## 2017-03-24 DIAGNOSIS — G43909 Migraine, unspecified, not intractable, without status migrainosus: Secondary | ICD-10-CM | POA: Diagnosis not present

## 2017-03-24 DIAGNOSIS — E1149 Type 2 diabetes mellitus with other diabetic neurological complication: Secondary | ICD-10-CM | POA: Diagnosis not present

## 2017-03-24 DIAGNOSIS — E039 Hypothyroidism, unspecified: Secondary | ICD-10-CM | POA: Diagnosis not present

## 2017-03-24 DIAGNOSIS — Z79899 Other long term (current) drug therapy: Secondary | ICD-10-CM | POA: Diagnosis not present

## 2017-03-24 DIAGNOSIS — M6740 Ganglion, unspecified site: Secondary | ICD-10-CM | POA: Diagnosis not present

## 2017-03-31 DIAGNOSIS — E1149 Type 2 diabetes mellitus with other diabetic neurological complication: Secondary | ICD-10-CM | POA: Diagnosis not present

## 2017-03-31 DIAGNOSIS — M549 Dorsalgia, unspecified: Secondary | ICD-10-CM | POA: Diagnosis not present

## 2017-03-31 DIAGNOSIS — G629 Polyneuropathy, unspecified: Secondary | ICD-10-CM | POA: Diagnosis not present

## 2017-03-31 DIAGNOSIS — G5600 Carpal tunnel syndrome, unspecified upper limb: Secondary | ICD-10-CM | POA: Diagnosis not present

## 2017-03-31 DIAGNOSIS — D860 Sarcoidosis of lung: Secondary | ICD-10-CM | POA: Diagnosis not present

## 2017-04-02 DIAGNOSIS — G894 Chronic pain syndrome: Secondary | ICD-10-CM | POA: Diagnosis not present

## 2017-04-08 DIAGNOSIS — G4733 Obstructive sleep apnea (adult) (pediatric): Secondary | ICD-10-CM | POA: Diagnosis not present

## 2017-04-27 DIAGNOSIS — G4733 Obstructive sleep apnea (adult) (pediatric): Secondary | ICD-10-CM | POA: Diagnosis not present

## 2017-05-01 DIAGNOSIS — G5600 Carpal tunnel syndrome, unspecified upper limb: Secondary | ICD-10-CM | POA: Diagnosis not present

## 2017-05-01 DIAGNOSIS — G629 Polyneuropathy, unspecified: Secondary | ICD-10-CM | POA: Diagnosis not present

## 2017-05-01 DIAGNOSIS — E1149 Type 2 diabetes mellitus with other diabetic neurological complication: Secondary | ICD-10-CM | POA: Diagnosis not present

## 2017-05-01 DIAGNOSIS — M549 Dorsalgia, unspecified: Secondary | ICD-10-CM | POA: Diagnosis not present

## 2017-05-01 DIAGNOSIS — D860 Sarcoidosis of lung: Secondary | ICD-10-CM | POA: Diagnosis not present

## 2017-05-19 ENCOUNTER — Other Ambulatory Visit: Payer: Self-pay | Admitting: Neurology

## 2017-05-21 DIAGNOSIS — M5030 Other cervical disc degeneration, unspecified cervical region: Secondary | ICD-10-CM | POA: Diagnosis not present

## 2017-05-21 DIAGNOSIS — M4802 Spinal stenosis, cervical region: Secondary | ICD-10-CM | POA: Diagnosis not present

## 2017-05-28 ENCOUNTER — Ambulatory Visit (INDEPENDENT_AMBULATORY_CARE_PROVIDER_SITE_OTHER): Payer: Medicare HMO | Admitting: Sports Medicine

## 2017-05-28 ENCOUNTER — Other Ambulatory Visit: Payer: Self-pay | Admitting: Sports Medicine

## 2017-05-28 ENCOUNTER — Encounter: Payer: Self-pay | Admitting: *Deleted

## 2017-05-28 ENCOUNTER — Encounter: Payer: Self-pay | Admitting: Sports Medicine

## 2017-05-28 ENCOUNTER — Ambulatory Visit (INDEPENDENT_AMBULATORY_CARE_PROVIDER_SITE_OTHER): Payer: Medicare HMO

## 2017-05-28 DIAGNOSIS — E1169 Type 2 diabetes mellitus with other specified complication: Secondary | ICD-10-CM

## 2017-05-28 DIAGNOSIS — G63 Polyneuropathy in diseases classified elsewhere: Secondary | ICD-10-CM

## 2017-05-28 DIAGNOSIS — M779 Enthesopathy, unspecified: Secondary | ICD-10-CM

## 2017-05-28 DIAGNOSIS — M2021 Hallux rigidus, right foot: Secondary | ICD-10-CM

## 2017-05-28 DIAGNOSIS — E669 Obesity, unspecified: Secondary | ICD-10-CM | POA: Diagnosis not present

## 2017-05-28 DIAGNOSIS — B351 Tinea unguium: Secondary | ICD-10-CM

## 2017-05-28 DIAGNOSIS — M2041 Other hammer toe(s) (acquired), right foot: Secondary | ICD-10-CM

## 2017-05-28 DIAGNOSIS — E349 Endocrine disorder, unspecified: Secondary | ICD-10-CM

## 2017-05-28 DIAGNOSIS — M79672 Pain in left foot: Secondary | ICD-10-CM

## 2017-05-28 DIAGNOSIS — M205X1 Other deformities of toe(s) (acquired), right foot: Secondary | ICD-10-CM | POA: Diagnosis not present

## 2017-05-28 DIAGNOSIS — M79674 Pain in right toe(s): Secondary | ICD-10-CM

## 2017-05-28 DIAGNOSIS — E119 Type 2 diabetes mellitus without complications: Secondary | ICD-10-CM | POA: Diagnosis not present

## 2017-05-28 DIAGNOSIS — M79671 Pain in right foot: Secondary | ICD-10-CM

## 2017-05-28 DIAGNOSIS — M79675 Pain in left toe(s): Secondary | ICD-10-CM | POA: Diagnosis not present

## 2017-05-28 MED ORDER — TRIAMCINOLONE ACETONIDE 10 MG/ML IJ SUSP
10.0000 mg | Freq: Once | INTRAMUSCULAR | Status: DC
Start: 2017-05-28 — End: 2018-11-24

## 2017-05-28 NOTE — Progress Notes (Signed)
Patient ID: Melanie Meyers, female   DOB: 21-Oct-1955, 61 y.o.   MRN: [id number] Subjective: Melanie Meyers is a 62 y.o. female patient with history of diabetes who return to office for follow-up evaluation states that on her right foot between the second and third toes and has been hurting really bad and has noticed mild swelling at the second toe and mild curling of the toes states that this has been worsening over the last 3 months and reports that she has not tried any treatment.  Patient also desires for a diabetic foot exam today and for nail care and for diabetic shoes.  States that she last saw her primary care doctor 3 months ago and her last A1c was noted at 6.1.  Reports that her blood sugar today was 81. Denies injury and any other pedal complaints.   Patient Active Problem List   Diagnosis Date Noted  . Chronic tension-type headache, not intractable 07/10/2016  . Transient alteration of awareness 10/10/2015  . Peripheral polyneuropathy 10/10/2015  . HTN (hypertension) 06/20/2015  . Hyperlipidemia 06/20/2015  . Morbid obesity ([place]) 06/20/2015  . Metatarsal deformity 05/30/2014  . Metatarsalgia 05/30/2014  . Neuropathy associated with endocrine disorder ([place]) 05/30/2014  . Hand paresthesia 06/27/2013  . Hypothyroidism 06/02/2013  . Type 2 diabetes mellitus ([place]) 06/02/2013  . 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 Nerve root pain 01/27/2013  . Narrowing of intervertebral disc space 01/27/2013  . Myofascial pain 01/27/2013  . Cervical spine pain 11/24/2012  . Cervical osteoarthritis 11/24/2012  . Carpal tunnel syndrome 01/15/2012  . Asthma 01/20/2008   Current Outpatient Medications on File Prior to Visit  Medication Sig Dispense Refill  . ACCU-CHEK AVIVA PLUS test strip 1 each by Other route as needed (for BS).     Marland Kitchen albuterol (VENTOLIN HFA) 108 (90 Base) MCG/ACT inhaler Inhale two puffs every four to six hours as needed for cough  or wheeze.    Marland Kitchen AMBULATORY NON FORMULARY MEDICATION 1 Units by Other route daily. Wear right wrist splint daily. 1 Units 0  . aspirin EC 81 MG tablet Take 81 mg by mouth at bedtime.     Marland Kitchen BIOTIN PO Take 5,000 mcg by mouth at bedtime.     . budesonide-formoterol (SYMBICORT) 160-4.5 MCG/ACT inhaler Inhale two puffs twice daily with spacer to prevent cough or wheeze.  Rinse, gargle, and spit after use. 1 Inhaler 5  . cetirizine (ZYRTEC) 10 MG tablet Take 10 mg by mouth at bedtime.     Marland Kitchen levothyroxine (SYNTHROID, LEVOTHROID) 25 MCG tablet Take 25 mcg by mouth daily before breakfast.    . losartan (COZAAR) 25 MG tablet Take 25 mg by mouth at bedtime.     Marland Kitchen LYRICA 100 MG capsule TAKE 1 CAPSULE TWICE DAILY 180 capsule 1  . MAGNESIUM PO Take by mouth daily.    . metFORMIN (GLUCOPHAGE) 1000 MG tablet Take 1,000 mg by mouth 2 (two) times daily with a meal.     . montelukast (SINGULAIR) 10 MG tablet Take 10 mg by mouth at bedtime.     . naproxen (NAPROSYN) 500 MG tablet Take 500 mg by mouth 2 (two) times daily with a meal.     . polyethylene glycol (MIRALAX / GLYCOLAX) packet Take 17 g by mouth daily with breakfast.     . pravastatin (PRAVACHOL) 40 MG tablet Take 40 mg by mouth at bedtime.    . ranitidine (ZANTAC) 300 MG tablet Take 1  tablet (300 mg total) by mouth at bedtime. 90 tablet 0  . rizatriptan (MAXALT) 10 MG tablet     . UNIFINE PENTIPS 31G X 6 MM MISC     . zolpidem (AMBIEN) 10 MG tablet Take 10 mg by mouth at bedtime.      Current Facility-Administered Medications on File Prior to Visit  Medication Dose Route Frequency Provider Last Rate Last Dose  . triamcinolone acetonide (KENALOG) 10 MG/ML injection 10 mg  10 mg Other Once Landis Martins, DPM       Allergies  Allergen Reactions  . Accupril [Quinapril Hcl] Cough  . Neurontin [Gabapentin] Other (See Comments)    Didn't work  . Prednisone Other (See Comments)    Has to be cautious due to BS  . Topamax [Topiramate] Other (See  Comments)    Didn't work  . Levaquin [Levofloxacin] Rash    No results found for this or any previous visit (from the past 2160 hour(s)).  Objective: General: Patient is awake, alert, and oriented x 3 and in no acute distress.  Integument: Skin is warm, dry and supple bilateral. Nails are mildly elongated thickened and mildly dystrophic distally consistent with early mycosis 1 through 10, no signs of infection. No open lesions or preulcerative lesions present bilateral. Remaining integument unremarkable.  Vasculature:  Dorsalis Pedis pulse 2/4 bilateral. Posterior Tibial pulse  1/4 bilateral. Capillary fill time <3 sec 1-5 bilateral. Scant hair growth to the level of the digits. Temperature gradient within normal limits. Mild varicosities present bilateral. No edema present bilateral.   Neurology: The patient has intact sensation measured with a 5.07/10g Semmes Weinstein Monofilament at all pedal sites bilateral. Vibratory sensation diminished bilateral with tuning fork. No Babinski sign present bilateral. Subjective numbness bilateral worse over 2-3 toes on right,  no reproducible moulders sign.   Musculoskeletal: Asymptomatic pes planus, mild hammertoe and noted limited first MPJ range of motion and equinus pedal deformities noted bilateral. Muscular strength 5/5 in all lower extremity muscular groups bilateral without pain on range of motion  Except minimal pain at the second and third toes, right foot.  No pain to calf.  Assessment and Plan: Problem List Items Addressed This Visit      Nervous and Auditory   Neuropathy associated with endocrine disorder ([place])    Other Visit Diagnoses    Hammer toe of right foot    -  Primary   Capsulitis       Relevant Medications   triamcinolone acetonide (KENALOG) 10 MG/ML injection 10 mg   Right foot pain       Hallux limitus, acquired, right       Diabetes mellitus type 2 in obese (HCC)       Foot pain, bilateral       Comprehensive diabetic  foot examination, type 2 DM, encounter for ([place])       Pain due to onychomycosis of toenails of both feet         -Examined patient. -Discussed and educated patient on diabetic foot care, especially with  regards to the vascular, neurological and musculoskeletal systems.  -Stressed the importance of good glycemic control and the detriment of not  controlling glucose levels in relation to the foot. -Mechanically debrided all nails x10 using sterile nail nipper without incident and smooth edges using dremel -[employer] diabetic shoe order form was completed; office to contact primary care for approval / certification;  Office to arrange shoe fitting and dispensing for this calendar year. -X-rays  reviewed revealing mild hammertoe contracture and midtarsal breech supportive of pes planus with mild soft tissue swelling in the forefoot no other acute findings -After oral consent and aseptic prep, injected a mixture containing 1 ml of 2% plain lidocaine, 1 ml 0.5% plain marcaine, 0.5 ml of kenalog 10 and 0.5 ml of dexamethasone phosphate into Right 2nd and third metatarsophalangeal joints dispersed evenly without complication. Post-injection care discussed with patient.  -Dispensed met pads and instructed on use advised patient to use these until she gets new diabetic shoes and insoles -Answered all patient questions -Note given to excuse patient from travel to [place] due to foot pain of which she was given to her daughter who will supply this to the airline because she does not want to go on her pairs trip with her daughter coming up -Patient to return to office for diabetic shoe measurement/fitting as scheduled -Patient advised to call the office if any problems or questions arise in the meantime.  Landis Martins, DPM

## 2017-05-31 DIAGNOSIS — G5600 Carpal tunnel syndrome, unspecified upper limb: Secondary | ICD-10-CM | POA: Diagnosis not present

## 2017-05-31 DIAGNOSIS — M549 Dorsalgia, unspecified: Secondary | ICD-10-CM | POA: Diagnosis not present

## 2017-05-31 DIAGNOSIS — G629 Polyneuropathy, unspecified: Secondary | ICD-10-CM | POA: Diagnosis not present

## 2017-05-31 DIAGNOSIS — D860 Sarcoidosis of lung: Secondary | ICD-10-CM | POA: Diagnosis not present

## 2017-05-31 DIAGNOSIS — E1149 Type 2 diabetes mellitus with other diabetic neurological complication: Secondary | ICD-10-CM | POA: Diagnosis not present

## 2017-06-01 ENCOUNTER — Other Ambulatory Visit: Payer: Self-pay | Admitting: Allergy and Immunology

## 2017-06-04 DIAGNOSIS — M5412 Radiculopathy, cervical region: Secondary | ICD-10-CM | POA: Diagnosis not present

## 2017-06-07 DIAGNOSIS — M47812 Spondylosis without myelopathy or radiculopathy, cervical region: Secondary | ICD-10-CM | POA: Diagnosis not present

## 2017-06-07 DIAGNOSIS — M542 Cervicalgia: Secondary | ICD-10-CM | POA: Diagnosis not present

## 2017-06-10 ENCOUNTER — Ambulatory Visit: Payer: Medicare HMO | Admitting: *Deleted

## 2017-06-10 DIAGNOSIS — E1169 Type 2 diabetes mellitus with other specified complication: Secondary | ICD-10-CM

## 2017-06-10 DIAGNOSIS — E669 Obesity, unspecified: Secondary | ICD-10-CM

## 2017-06-11 ENCOUNTER — Ambulatory Visit (INDEPENDENT_AMBULATORY_CARE_PROVIDER_SITE_OTHER): Payer: Medicare HMO | Admitting: Sports Medicine

## 2017-06-11 ENCOUNTER — Encounter: Payer: Self-pay | Admitting: Sports Medicine

## 2017-06-11 DIAGNOSIS — M79671 Pain in right foot: Secondary | ICD-10-CM

## 2017-06-11 DIAGNOSIS — G63 Polyneuropathy in diseases classified elsewhere: Secondary | ICD-10-CM | POA: Diagnosis not present

## 2017-06-11 DIAGNOSIS — M7750 Other enthesopathy of unspecified foot: Secondary | ICD-10-CM

## 2017-06-11 DIAGNOSIS — R918 Other nonspecific abnormal finding of lung field: Secondary | ICD-10-CM | POA: Diagnosis not present

## 2017-06-11 DIAGNOSIS — E349 Endocrine disorder, unspecified: Secondary | ICD-10-CM

## 2017-06-11 DIAGNOSIS — E669 Obesity, unspecified: Secondary | ICD-10-CM | POA: Diagnosis not present

## 2017-06-11 DIAGNOSIS — M205X1 Other deformities of toe(s) (acquired), right foot: Secondary | ICD-10-CM | POA: Diagnosis not present

## 2017-06-11 DIAGNOSIS — M2041 Other hammer toe(s) (acquired), right foot: Secondary | ICD-10-CM

## 2017-06-11 DIAGNOSIS — M2021 Hallux rigidus, right foot: Secondary | ICD-10-CM

## 2017-06-11 DIAGNOSIS — E1169 Type 2 diabetes mellitus with other specified complication: Secondary | ICD-10-CM

## 2017-06-11 DIAGNOSIS — M779 Enthesopathy, unspecified: Secondary | ICD-10-CM

## 2017-06-11 NOTE — Progress Notes (Signed)
Patient ID: Hettie Roselli, female   DOB: 11-19-1955, 62 y.o.   MRN: [id number] Subjective: Sukhmani Fetherolf is a 62 y.o. female patient with history of diabetes who return to office for follow-up evaluation states that on her right foot still has pain at second and third toes anytime that she is on her feet for any extended amount of time reports that pain is sharp and dull achy 10 out of 10 after being on her feet.  Also feels like she is falling forward when she is walking states that she is having other issues going on with her sciatic nerve on the left cervical spine of which she had an MRI of last week and also her right knee and states that she thinks that this may be adding to her symptoms.  Patient did not check blood sugar for this morning and denies any other pedal complaints at this time.    Patient Active Problem List   Diagnosis Date Noted  . Chronic tension-type headache, not intractable 07/10/2016  . Transient alteration of awareness 10/10/2015  . Peripheral polyneuropathy 10/10/2015  . HTN (hypertension) 06/20/2015  . Hyperlipidemia 06/20/2015  . Morbid obesity ([place]) 06/20/2015  . Metatarsal deformity 05/30/2014  . Metatarsalgia 05/30/2014  . Neuropathy associated with endocrine disorder ([place]) 05/30/2014  . Hand paresthesia 06/27/2013  . Hypothyroidism 06/02/2013  . Type 2 diabetes mellitus ([place]) 06/02/2013  . 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 Nerve root pain 01/27/2013  . Narrowing of intervertebral disc space 01/27/2013  . Myofascial pain 01/27/2013  . Cervical spine pain 11/24/2012  . Cervical osteoarthritis 11/24/2012  . Carpal tunnel syndrome 01/15/2012  . Asthma 01/20/2008   Current Outpatient Medications on File Prior to Visit  Medication Sig Dispense Refill  . ACCU-CHEK AVIVA PLUS test strip 1 each by Other route as needed (for BS).     Marland Kitchen albuterol (VENTOLIN HFA) 108 (90 Base) MCG/ACT inhaler Inhale two puffs  every four to six hours as needed for cough or wheeze.    Marland Kitchen AMBULATORY NON FORMULARY MEDICATION 1 Units by Other route daily. Wear right wrist splint daily. 1 Units 0  . aspirin EC 81 MG tablet Take 81 mg by mouth at bedtime.     Marland Kitchen BIOTIN PO Take 5,000 mcg by mouth at bedtime.     . budesonide-formoterol (SYMBICORT) 160-4.5 MCG/ACT inhaler Inhale two puffs twice daily with spacer to prevent cough or wheeze.  Rinse, gargle, and spit after use. 1 Inhaler 5  . cetirizine (ZYRTEC) 10 MG tablet Take 10 mg by mouth at bedtime.     Marland Kitchen levothyroxine (SYNTHROID, LEVOTHROID) 25 MCG tablet Take 25 mcg by mouth daily before breakfast.    . losartan (COZAAR) 25 MG tablet Take 25 mg by mouth at bedtime.     Marland Kitchen LYRICA 100 MG capsule TAKE 1 CAPSULE TWICE DAILY 180 capsule 1  . MAGNESIUM PO Take by mouth daily.    . metFORMIN (GLUCOPHAGE) 1000 MG tablet Take 1,000 mg by mouth 2 (two) times daily with a meal.     . montelukast (SINGULAIR) 10 MG tablet Take 10 mg by mouth at bedtime.     . naproxen (NAPROSYN) 500 MG tablet Take 500 mg by mouth 2 (two) times daily with a meal.     . polyethylene glycol (MIRALAX / GLYCOLAX) packet Take 17 g by mouth daily with breakfast.     . pravastatin (PRAVACHOL) 40 MG tablet Take 40 mg by mouth  at bedtime.    . ranitidine (ZANTAC) 300 MG tablet TAKE 1 TABLET AT BEDTIME 90 tablet 0  . rizatriptan (MAXALT) 10 MG tablet     . UNIFINE PENTIPS 31G X 6 MM MISC     . zolpidem (AMBIEN) 10 MG tablet Take 10 mg by mouth at bedtime.      Current Facility-Administered Medications on File Prior to Visit  Medication Dose Route Frequency Provider Last Rate Last Dose  . triamcinolone acetonide (KENALOG) 10 MG/ML injection 10 mg  10 mg Other Once Landis Martins, DPM      . triamcinolone acetonide (KENALOG) 10 MG/ML injection 10 mg  10 mg Other Once Landis Martins, DPM       Allergies  Allergen Reactions  . Accupril [Quinapril Hcl] Cough  . Neurontin [Gabapentin] Other (See Comments)     Didn't work  . Prednisone Other (See Comments)    Has to be cautious due to BS  . Topamax [Topiramate] Other (See Comments)    Didn't work  . Levaquin [Levofloxacin] Rash    No results found for this or any previous visit (from the past 2160 hour(s)).  Objective: General: Patient is awake, alert, and oriented x 3 and in no acute distress.  Integument: Skin is warm, dry and supple bilateral. Nails are short thickened and mildly dystrophic distally consistent with early mycosis 1 through 10, no signs of infection. No open lesions or preulcerative lesions present bilateral.  Bruise on right foot from previous steroid injection with no signs of infection.  Remaining integument unremarkable.  Vasculature:  Dorsalis Pedis pulse 2/4 bilateral. Posterior Tibial pulse  1/4 bilateral. Capillary fill time <3 sec 1-5 bilateral. Scant hair growth to the level of the digits. Temperature gradient within normal limits. Mild varicosities present bilateral. No edema present bilateral.   Neurology: The patient has intact sensation measured with a 5.07/10g Semmes Weinstein Monofilament at all pedal sites bilateral. Vibratory sensation diminished bilateral with tuning fork. No Babinski sign present bilateral. Subjective numbness bilateral worse over 1-3 toes on right,  no reproducible moulders sign.   Musculoskeletal: Asymptomatic pes planus, mild hammertoe and noted limited first MPJ range of motion and equinus pedal deformities noted bilateral. Muscular strength 5/5 in all lower extremity muscular groups bilateral without pain on range of motion  Except minimal pain at the first through third toes with worst pain to second and third toes on right foot.  No pain to calf.  Assessment and Plan: Problem List Items Addressed This Visit      Nervous and Auditory   Neuropathy associated with endocrine disorder ([place])    Other Visit Diagnoses    Capsulitis    -  Primary   Right foot pain       Hammer toe of right  foot       Hallux limitus, acquired, right       Diabetes mellitus type 2 in obese ([place])         -Examined patient. -Discussed and educated patient on diabetic foot care, especially with  regards to the vascular, neurological and musculoskeletal systems.  -Discussed with patient treatment options for capsulitis and advised patient that I cannot give her another injection since she had one less than 2 weeks ago with still residual bruising -Recommend stiff soled shoe dispensed postop shoe to use as instructed to help protect and offload the lesser MPJs for the next 2 weeks advised patient that she may use topical pain cream and rub of Biofreeze which  she has at home and also may use TENS unit to help with any pain or discomfort -Advised patient to follow-up with her doctor about her cervical spine and other medical issues -Patient to return to office in 2 weeks if pain is still there we will refer patient to physical therapy for her right foot pain and also to treat her left sciatic nerve and to help with stability in gait and subjective weakness -Patient advised to call the office if any problems or questions arise in the meantime.  Landis Martins, DPM

## 2017-06-14 DIAGNOSIS — M5412 Radiculopathy, cervical region: Secondary | ICD-10-CM | POA: Diagnosis not present

## 2017-06-16 DIAGNOSIS — G629 Polyneuropathy, unspecified: Secondary | ICD-10-CM | POA: Diagnosis not present

## 2017-06-16 DIAGNOSIS — Z6841 Body Mass Index (BMI) 40.0 and over, adult: Secondary | ICD-10-CM | POA: Diagnosis not present

## 2017-06-16 DIAGNOSIS — E1149 Type 2 diabetes mellitus with other diabetic neurological complication: Secondary | ICD-10-CM | POA: Diagnosis not present

## 2017-06-16 DIAGNOSIS — E119 Type 2 diabetes mellitus without complications: Secondary | ICD-10-CM | POA: Diagnosis not present

## 2017-06-21 DIAGNOSIS — R918 Other nonspecific abnormal finding of lung field: Secondary | ICD-10-CM | POA: Diagnosis not present

## 2017-06-21 DIAGNOSIS — D869 Sarcoidosis, unspecified: Secondary | ICD-10-CM | POA: Diagnosis not present

## 2017-06-21 DIAGNOSIS — G4733 Obstructive sleep apnea (adult) (pediatric): Secondary | ICD-10-CM | POA: Diagnosis not present

## 2017-06-21 DIAGNOSIS — R5383 Other fatigue: Secondary | ICD-10-CM | POA: Diagnosis not present

## 2017-06-21 DIAGNOSIS — J309 Allergic rhinitis, unspecified: Secondary | ICD-10-CM | POA: Diagnosis not present

## 2017-06-22 ENCOUNTER — Other Ambulatory Visit: Payer: Self-pay | Admitting: Neurology

## 2017-06-23 DIAGNOSIS — Z1339 Encounter for screening examination for other mental health and behavioral disorders: Secondary | ICD-10-CM | POA: Diagnosis not present

## 2017-06-23 DIAGNOSIS — Z79899 Other long term (current) drug therapy: Secondary | ICD-10-CM | POA: Diagnosis not present

## 2017-06-23 DIAGNOSIS — E559 Vitamin D deficiency, unspecified: Secondary | ICD-10-CM | POA: Diagnosis not present

## 2017-06-23 DIAGNOSIS — E785 Hyperlipidemia, unspecified: Secondary | ICD-10-CM | POA: Diagnosis not present

## 2017-06-23 DIAGNOSIS — I10 Essential (primary) hypertension: Secondary | ICD-10-CM | POA: Diagnosis not present

## 2017-06-23 DIAGNOSIS — Z9013 Acquired absence of bilateral breasts and nipples: Secondary | ICD-10-CM | POA: Diagnosis not present

## 2017-06-23 DIAGNOSIS — E1149 Type 2 diabetes mellitus with other diabetic neurological complication: Secondary | ICD-10-CM | POA: Diagnosis not present

## 2017-06-23 DIAGNOSIS — Z6841 Body Mass Index (BMI) 40.0 and over, adult: Secondary | ICD-10-CM | POA: Diagnosis not present

## 2017-06-23 DIAGNOSIS — E039 Hypothyroidism, unspecified: Secondary | ICD-10-CM | POA: Diagnosis not present

## 2017-06-24 DIAGNOSIS — Z9013 Acquired absence of bilateral breasts and nipples: Secondary | ICD-10-CM | POA: Diagnosis not present

## 2017-06-25 ENCOUNTER — Encounter: Payer: Self-pay | Admitting: Sports Medicine

## 2017-06-25 ENCOUNTER — Ambulatory Visit (INDEPENDENT_AMBULATORY_CARE_PROVIDER_SITE_OTHER): Payer: Medicare HMO | Admitting: Sports Medicine

## 2017-06-25 DIAGNOSIS — M5432 Sciatica, left side: Secondary | ICD-10-CM | POA: Diagnosis not present

## 2017-06-25 DIAGNOSIS — E669 Obesity, unspecified: Secondary | ICD-10-CM

## 2017-06-25 DIAGNOSIS — R2681 Unsteadiness on feet: Secondary | ICD-10-CM | POA: Diagnosis not present

## 2017-06-25 DIAGNOSIS — M79671 Pain in right foot: Secondary | ICD-10-CM

## 2017-06-25 DIAGNOSIS — E1169 Type 2 diabetes mellitus with other specified complication: Secondary | ICD-10-CM | POA: Diagnosis not present

## 2017-06-25 DIAGNOSIS — M79672 Pain in left foot: Secondary | ICD-10-CM | POA: Diagnosis not present

## 2017-06-25 DIAGNOSIS — M779 Enthesopathy, unspecified: Secondary | ICD-10-CM | POA: Diagnosis not present

## 2017-06-25 DIAGNOSIS — Z01810 Encounter for preprocedural cardiovascular examination: Secondary | ICD-10-CM

## 2017-06-25 NOTE — Progress Notes (Signed)
Patient ID: Melanie Meyers, female   DOB: 12-15-1955, 62 y.o.   MRN: [id number] Subjective: Melanie Meyers is a 62 y.o. female patient with history of diabetes who return to office for follow-up evaluation of right foot pain patient states that she has noticed some improvement only gets sharp pain when she is on her foot a lot at the second and third toes and extends to her great toe states that she can only wear the postop shoe for 1 week because it messed with her sciatic nerve issue on the left and does admit now that she is having a little bit of left foot pain along the ball.  Patient has used her TENS unit Biofreeze and taping all of which seem to help patient still admits that she is having gait problems and increasing pain with her sciatic nerve and is going to physical therapy for her cervical spine because she has some neck issues that may have to be operated on..  Patient did not check blood sugar for this morning and denies any other pedal complaints at this time.    Patient Active Problem List   Diagnosis Date Noted  . Chronic tension-type headache, not intractable 07/10/2016  . Transient alteration of awareness 10/10/2015  . Peripheral polyneuropathy 10/10/2015  . HTN (hypertension) 06/20/2015  . Hyperlipidemia 06/20/2015  . Morbid obesity ([place]) 06/20/2015  . Metatarsal deformity 05/30/2014  . Metatarsalgia 05/30/2014  . Neuropathy associated with endocrine disorder ([place]) 05/30/2014  . Hand paresthesia 06/27/2013  . Hypothyroidism 06/02/2013  . Type 2 diabetes mellitus ([place]) 06/02/2013  . 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 Nerve root pain 01/27/2013  . Narrowing of intervertebral disc space 01/27/2013  . Myofascial pain 01/27/2013  . Cervical spine pain 11/24/2012  . Cervical osteoarthritis 11/24/2012  . Carpal tunnel syndrome 01/15/2012  . Asthma 01/20/2008   Current Outpatient Medications on File Prior to Visit  Medication  Sig Dispense Refill  . ACCU-CHEK AVIVA PLUS test strip 1 each by Other route as needed (for BS).     Marland Kitchen albuterol (VENTOLIN HFA) 108 (90 Base) MCG/ACT inhaler Inhale two puffs every four to six hours as needed for cough or wheeze.    Marland Kitchen AMBULATORY NON FORMULARY MEDICATION 1 Units by Other route daily. Wear right wrist splint daily. 1 Units 0  . aspirin EC 81 MG tablet Take 81 mg by mouth at bedtime.     Marland Kitchen BIOTIN PO Take 5,000 mcg by mouth at bedtime.     . budesonide-formoterol (SYMBICORT) 160-4.5 MCG/ACT inhaler Inhale two puffs twice daily with spacer to prevent cough or wheeze.  Rinse, gargle, and spit after use. 1 Inhaler 5  . cetirizine (ZYRTEC) 10 MG tablet Take 10 mg by mouth at bedtime.     Marland Kitchen levothyroxine (SYNTHROID, LEVOTHROID) 25 MCG tablet Take 25 mcg by mouth daily before breakfast.    . losartan (COZAAR) 25 MG tablet Take 25 mg by mouth at bedtime.     Marland Kitchen LYRICA 100 MG capsule TAKE 1 CAPSULE TWICE DAILY 180 capsule 1  . MAGNESIUM PO Take by mouth daily.    . metFORMIN (GLUCOPHAGE) 1000 MG tablet Take 1,000 mg by mouth 2 (two) times daily with a meal.     . montelukast (SINGULAIR) 10 MG tablet Take 10 mg by mouth at bedtime.     . naproxen (NAPROSYN) 500 MG tablet Take 500 mg by mouth 2 (two) times daily with a meal.     .  polyethylene glycol (MIRALAX / GLYCOLAX) packet Take 17 g by mouth daily with breakfast.     . pravastatin (PRAVACHOL) 40 MG tablet Take 40 mg by mouth at bedtime.    . ranitidine (ZANTAC) 300 MG tablet TAKE 1 TABLET AT BEDTIME 90 tablet 0  . rizatriptan (MAXALT) 10 MG tablet     . UNIFINE PENTIPS 31G X 6 MM MISC     . zolpidem (AMBIEN) 10 MG tablet Take 10 mg by mouth at bedtime.      Current Facility-Administered Medications on File Prior to Visit  Medication Dose Route Frequency Provider Last Rate Last Dose  . triamcinolone acetonide (KENALOG) 10 MG/ML injection 10 mg  10 mg Other Once Landis Martins, DPM      . triamcinolone acetonide (KENALOG) 10 MG/ML  injection 10 mg  10 mg Other Once Landis Martins, DPM       Allergies  Allergen Reactions  . Accupril [Quinapril Hcl] Cough  . Neurontin [Gabapentin] Other (See Comments)    Didn't work  . Prednisone Other (See Comments)    Has to be cautious due to BS  . Topamax [Topiramate] Other (See Comments)    Didn't work  . Levaquin [Levofloxacin] Rash    No results found for this or any previous visit (from the past 2160 hour(s)).  Objective: General: Patient is awake, alert, and oriented x 3 and in no acute distress.  Integument: Skin is warm, dry and supple bilateral. Nails are short thickened and mildly dystrophic distally consistent with early mycosis 1 through 10, no signs of infection. No open lesions or preulcerative lesions present bilateral.  Bruise on right foot from previous steroid injection with no signs of infection.  Remaining integument unremarkable.  Vasculature:  Dorsalis Pedis pulse 2/4 bilateral. Posterior Tibial pulse  1/4 bilateral. Capillary fill time <3 sec 1-5 bilateral. Scant hair growth to the level of the digits. Temperature gradient within normal limits. Mild varicosities present bilateral. No edema present bilateral.   Neurology: The patient has intact sensation measured with a 5.07/10g Semmes Weinstein Monofilament at all pedal sites bilateral. Vibratory sensation diminished bilateral with tuning fork. No Babinski sign present bilateral. Subjective numbness bilateral worse over 1-3 toes on right>left with swelling subjectively in the ball of the foot,  no reproducible moulders sign.   Musculoskeletal: Minimal pain to palpation to right and left forefoot.  Asymptomatic pes planus, mild hammertoe and noted limited first MPJ range of motion and equinus pedal deformities noted bilateral. Muscular strength 5/5 in all lower extremity muscular groups bilateral without pain on range of motion  No pain to calf.  Assessment and Plan: Problem List Items Addressed This Visit     None    Visit Diagnoses    Capsulitis    -  Primary   Right foot pain       Left foot pain       Diabetes mellitus type 2 in obese [facility])       Gait instability       Sciatica of left side         -Examined patient. -Discussed and educated patient on diabetic foot care, especially with  regards to the vascular, neurological and musculoskeletal systems.  -Discussed continued care for equinus with forefoot overload and capsulitis and history of sciatica.  Recommend patient to start physical therapy for additional treatment modalities -Recommend good supportive shoes  -Continue to use topical pain cream and rub of Biofreeze which she has at home and also may  use TENS unit to help with any pain or discomfort as previous -Patient to return to pick up diabetic shoes when called -Patient advised to call the office if any problems or questions arise in the meantime.  Landis Martins, DPM

## 2017-06-29 ENCOUNTER — Telehealth: Payer: Self-pay | Admitting: Neurology

## 2017-06-29 ENCOUNTER — Other Ambulatory Visit: Payer: Self-pay

## 2017-06-29 MED ORDER — PREGABALIN 100 MG PO CAPS
100.0000 mg | ORAL_CAPSULE | Freq: Two times a day (BID) | ORAL | 0 refills | Status: DC
Start: 2017-06-29 — End: 2017-07-14

## 2017-06-29 NOTE — Telephone Encounter (Signed)
*  STAT* If patient is at the pharmacy, call can be transferred to refill team.  1.     Which medications need to be refilled? (please list name of each medication and dose if know) lyrica  2.     Which pharmacy/location (including street and city if local pharmacy) is medication to be sent to? [facility] in [place] on [address]    3.     Do they need a 30 or 90 day supply?   Patient states that she will be short of medication until she can come to see Dr Delice Lesch in July. She has had to take 3 pills somedays due to how much pain she was in

## 2017-06-29 NOTE — Telephone Encounter (Signed)
Rx for #[id number] routed to Dr. Delice Lesch for approval

## 2017-07-01 DIAGNOSIS — G5600 Carpal tunnel syndrome, unspecified upper limb: Secondary | ICD-10-CM | POA: Diagnosis not present

## 2017-07-01 DIAGNOSIS — D860 Sarcoidosis of lung: Secondary | ICD-10-CM | POA: Diagnosis not present

## 2017-07-01 DIAGNOSIS — G629 Polyneuropathy, unspecified: Secondary | ICD-10-CM | POA: Diagnosis not present

## 2017-07-01 DIAGNOSIS — E1149 Type 2 diabetes mellitus with other diabetic neurological complication: Secondary | ICD-10-CM | POA: Diagnosis not present

## 2017-07-01 DIAGNOSIS — M549 Dorsalgia, unspecified: Secondary | ICD-10-CM | POA: Diagnosis not present

## 2017-07-02 DIAGNOSIS — M5412 Radiculopathy, cervical region: Secondary | ICD-10-CM | POA: Diagnosis not present

## 2017-07-02 DIAGNOSIS — M542 Cervicalgia: Secondary | ICD-10-CM | POA: Diagnosis not present

## 2017-07-06 DIAGNOSIS — R2681 Unsteadiness on feet: Secondary | ICD-10-CM | POA: Diagnosis not present

## 2017-07-06 DIAGNOSIS — R2689 Other abnormalities of gait and mobility: Secondary | ICD-10-CM | POA: Diagnosis not present

## 2017-07-06 DIAGNOSIS — M79671 Pain in right foot: Secondary | ICD-10-CM | POA: Diagnosis not present

## 2017-07-06 DIAGNOSIS — M5432 Sciatica, left side: Secondary | ICD-10-CM | POA: Diagnosis not present

## 2017-07-06 DIAGNOSIS — M779 Enthesopathy, unspecified: Secondary | ICD-10-CM | POA: Diagnosis not present

## 2017-07-09 DIAGNOSIS — M542 Cervicalgia: Secondary | ICD-10-CM | POA: Diagnosis not present

## 2017-07-09 DIAGNOSIS — M5412 Radiculopathy, cervical region: Secondary | ICD-10-CM | POA: Diagnosis not present

## 2017-07-13 DIAGNOSIS — M79671 Pain in right foot: Secondary | ICD-10-CM | POA: Diagnosis not present

## 2017-07-13 DIAGNOSIS — M5432 Sciatica, left side: Secondary | ICD-10-CM | POA: Diagnosis not present

## 2017-07-13 DIAGNOSIS — M779 Enthesopathy, unspecified: Secondary | ICD-10-CM | POA: Diagnosis not present

## 2017-07-13 DIAGNOSIS — R2681 Unsteadiness on feet: Secondary | ICD-10-CM | POA: Diagnosis not present

## 2017-07-13 DIAGNOSIS — R2689 Other abnormalities of gait and mobility: Secondary | ICD-10-CM | POA: Diagnosis not present

## 2017-07-14 ENCOUNTER — Ambulatory Visit (INDEPENDENT_AMBULATORY_CARE_PROVIDER_SITE_OTHER): Payer: Medicare HMO | Admitting: Neurology

## 2017-07-14 ENCOUNTER — Encounter: Payer: Self-pay | Admitting: Neurology

## 2017-07-14 VITALS — BP 140/80 | HR 70 | Ht 60.0 in | Wt 216.5 lb

## 2017-07-14 DIAGNOSIS — M542 Cervicalgia: Secondary | ICD-10-CM | POA: Diagnosis not present

## 2017-07-14 DIAGNOSIS — E0842 Diabetes mellitus due to underlying condition with diabetic polyneuropathy: Secondary | ICD-10-CM

## 2017-07-14 DIAGNOSIS — G44229 Chronic tension-type headache, not intractable: Secondary | ICD-10-CM | POA: Diagnosis not present

## 2017-07-14 DIAGNOSIS — F321 Major depressive disorder, single episode, moderate: Secondary | ICD-10-CM | POA: Diagnosis not present

## 2017-07-14 MED ORDER — PREGABALIN 150 MG PO CAPS: ORAL_CAPSULE | ORAL | 5 refills | Status: DC

## 2017-07-14 NOTE — Progress Notes (Signed)
NEUROLOGY FOLLOW UP OFFICE NOTE  Melanie Meyers [id number]  DOB: 12-26-1955  HISTORY OF PRESENT ILLNESS: I had the pleasure of seeing Melanie Meyers in follow-up in the neurology clinic on 07/14/2017.  The patient was last seen 6 months ago. She had initially presented after an episode of loss of awareness last July 2017. MRI brain with and without contrast which did not show any acute changes, hippocampi symmetric with no abnormal signal or enhancement seen. Her 1-hour wake and sleep EEG was normal. Single-lead EKG showed sinus bradycardia. No further similar episodes since 2017. She has been followed in our office since then for headaches and neuropathy. On her last visit, she reported headaches were better and expressed interest in tapering down Lyrica from 450mg  daily down to 100mg  BID. She presents today reporting diffuse headaches on a daily basis, as well as pain in her feet all the time. Sometimes she takes an additional dose of Lyrica. She has also been overall feeling unwell and ill. She has trouble sleeping, always feels tired, has trouble with concentration and memory, and has more anxiety and depression. She had self-discontinued Zoloft in the past but did feel it helped with mood. She has also been dealing with neck pain and has seen Dr. Nelva Bush who did injections with no relief, then Dr. Rolena Infante who had discussed neck surgery. She reports she got so upset and was mentally not ready, so she was referred for PT with plans for surgery possibly in October. She denies any falls.  HPI 10/09/15: This is a 62 yo RH woman with a history of hypertension, diabetes, sleep apnea on CPAP, sarcoidosis, B12 deficiency, hypothyroidism, anxiety, migraines, with a transient episode of loss of awareness last 07/24/2015. She recalls sitting in the car with her daughter then suddenly feeling sleepy. She recalls closing her eyes, then waking up in the ER. She then went to [facility] ER, records reviewed. Her other  daughter witnessed her head bobbing back and forth, she was not responding to questions although she was reported to be awake. She was told her face was droopy and her speech was slurred when she asked her daughter why she was being taken to the ER (she does not recall this). They stopped on the road and she got out of the car, then fell backwards, no injuries. They went to a different ER but left due to a long wait, then went to [facility] where she was back to baseline. The episode lasted 45 minutes. She reported a mild headache with photophobia all day prior to the event, and was mildly nauseated in the car. She reported feeling mildly numb on her left arm, but today denies any focal symptoms. Bloodwork showed unremarkable CBC, CMP, negative ethanol level. I personally reviewed head CT without contrast which did not show any acute changes. She denies any further episodes since then, no history of similar episodes in the past.  She and her daughter deny any other staring/unresponsive episodes or gaps in time, no olfactory/gustatory hallucinations, deja vu, rising epigastric sensation, focal numbness/tingling/weakness, myoclonic jerks. She has a history of migraines that used to respond to Relpax, however over the past 3-4 months, she has been having a constant throbbing headache over the vertex and occipital regions, waxing and waning in intensity, with associated photosensitivity, no nausea/vomiting. Imitrex does not help as much as the Relpax, however insurance will not cover Relpax. She has occasional dizziness described as lightheadedness and occasional spinning, lasting a few seconds, which can occur with  any position. She has chronic neck and back pain. She has a history of neuropathy with numbness and tingling in both arms and legs, she states the neuropathy in her arms are due to B12 deficiency, and due to diabetic neuropathy in her legs. Her last HbA1c was 6.1. She is taking Lyrica 75mg  BID. On higher dose  (150mg  BID), she was told by her son she had psychiatric problems, which hurt her. She does not see Psychiatry. She reports unrefreshing sleep despite use of CPAP machine. She has been forgetful for the past few months, she forgets words. She drives and denies getting lost driving. She lives with her daughter and denies any missed bill payments or missed medications. Her mother and maternal aunt had dementia. She reports starting Paxil a week before the episode, this was switched to Abilify 2 weeks ago.   She had a normal birth and early development.  There is no history of febrile convulsions, CNS infections such as meningitis/encephalitis, significant traumatic brain injury, neurosurgical procedures, or family history of seizures.    PAST MEDICAL HISTORY: Past Medical History:  Diagnosis Date  . Asthma   . Degenerative arthritis    degenerative arthritis of neck  . Depression   . Diabetes mellitus without complication ([place])   . Fibromyalgia   . GERD (gastroesophageal reflux disease)   . High cholesterol   . Hypertension   . Migraine   . Neuropathy   . Sarcoidosis   . Sleep apnea   . Thyroid disease   . Vitamin D deficiency     MEDICATIONS: Current Outpatient Medications on File Prior to Visit  Medication Sig Dispense Refill  . ACCU-CHEK AVIVA PLUS test strip 1 each by Other route as needed (for BS).     Marland Kitchen albuterol (VENTOLIN HFA) 108 (90 Base) MCG/ACT inhaler Inhale two puffs every four to six hours as needed for cough or wheeze.    Marland Kitchen AMBULATORY NON FORMULARY MEDICATION 1 Units by Other route daily. Wear right wrist splint daily. 1 Units 0  . aspirin EC 81 MG tablet Take 81 mg by mouth at bedtime.     Marland Kitchen BIOTIN PO Take 5,000 mcg by mouth at bedtime.     . budesonide-formoterol (SYMBICORT) 160-4.5 MCG/ACT inhaler Inhale two puffs twice daily with spacer to prevent cough or wheeze.  Rinse, gargle, and spit after use. 1 Inhaler 5  . cetirizine (ZYRTEC) 10 MG tablet Take 10 mg by  mouth at bedtime.     Marland Kitchen levothyroxine (SYNTHROID, LEVOTHROID) 25 MCG tablet Take 25 mcg by mouth daily before breakfast.    . losartan (COZAAR) 25 MG tablet Take 25 mg by mouth at bedtime.     Marland Kitchen MAGNESIUM PO Take by mouth daily.    . metFORMIN (GLUCOPHAGE) 1000 MG tablet Take 1,000 mg by mouth 2 (two) times daily with a meal.     . montelukast (SINGULAIR) 10 MG tablet Take 10 mg by mouth at bedtime.     . naproxen (NAPROSYN) 500 MG tablet Take 500 mg by mouth 2 (two) times daily with a meal.     . polyethylene glycol (MIRALAX / GLYCOLAX) packet Take 17 g by mouth daily with breakfast.     . pravastatin (PRAVACHOL) 40 MG tablet Take 40 mg by mouth at bedtime.    . pregabalin (LYRICA) 100 MG capsule Take 1 capsule (100 mg total) by mouth 2 (two) times daily. 60 capsule 0  . ranitidine (ZANTAC) 300 MG tablet TAKE 1 TABLET AT  BEDTIME 90 tablet 0  . rizatriptan (MAXALT) 10 MG tablet     . UNIFINE PENTIPS 31G X 6 MM MISC     . zolpidem (AMBIEN) 10 MG tablet Take 10 mg by mouth at bedtime.      Current Facility-Administered Medications on File Prior to Visit  Medication Dose Route Frequency Provider Last Rate Last Dose  . triamcinolone acetonide (KENALOG) 10 MG/ML injection 10 mg  10 mg Other Once Landis Martins, DPM      . triamcinolone acetonide (KENALOG) 10 MG/ML injection 10 mg  10 mg Other Once Landis Martins, DPM        ALLERGIES: Allergies  Allergen Reactions  . Accupril [Quinapril Hcl] Cough  . Neurontin [Gabapentin] Other (See Comments)    Didn't work  . Prednisone Other (See Comments)    Has to be cautious due to BS  . Topamax [Topiramate] Other (See Comments)    Didn't work  . Levaquin [Levofloxacin] Rash    FAMILY HISTORY: Family History  Problem Relation Age of Onset  . Parkinson's disease Mother   . Diabetes Mother   . Alzheimer's disease Mother   . Gout Mother   . High blood pressure Father   . High Cholesterol Father   . Heart attack Father   . Thyroid disease  Sister   . Heart attack Sister   . Thyroid disease Sister   . Thyroid disease Sister     SOCIAL HISTORY: Social History   Socioeconomic History  . Marital status: Widowed    Spouse name: Not on file  . Number of children: Not on file  . Years of education: Not on file  . Highest education level: Not on file  Occupational History  . Not on file  Social Needs  . Financial resource strain: Not on file  . Food insecurity:    Worry: Not on file    Inability: Not on file  . Transportation needs:    Medical: Not on file    Non-medical: Not on file  Tobacco Use  . Smoking status: Never Smoker  . Smokeless tobacco: Never Used  Substance and Sexual Activity  . Alcohol use: No    Alcohol/week: 0.0 oz  . Drug use: No  . Sexual activity: Not on file  Lifestyle  . Physical activity:    Days per week: Not on file    Minutes per session: Not on file  . Stress: Not on file  Relationships  . Social connections:    Talks on phone: Not on file    Gets together: Not on file    Attends religious service: Not on file    Active member of club or organization: Not on file    Attends meetings of clubs or organizations: Not on file    Relationship status: Not on file  . Intimate partner violence:    Fear of current or ex partner: Not on file    Emotionally abused: Not on file    Physically abused: Not on file    Forced sexual activity: Not on file  Other Topics Concern  . Not on file  Social History Narrative  . Not on file    REVIEW OF SYSTEMS: Constitutional: No fevers, chills, or sweats, no generalized fatigue, change in appetite Eyes: No visual changes, double vision, eye pain Ear, nose and throat: No hearing loss, ear pain, nasal congestion, sore throat Cardiovascular: No chest pain, palpitations Respiratory:  No shortness of breath at rest or with exertion,  wheezes GastrointestinaI: No nausea, vomiting, diarrhea, abdominal pain, fecal incontinence Genitourinary:  No  dysuria, urinary retention or frequency Musculoskeletal:  + neck pain,+ back pain Integumentary: No rash, pruritus, skin lesions Neurological: as above Psychiatric: + depression, no insomnia, anxiety Endocrine: No palpitations, fatigue, diaphoresis, mood swings, change in appetite, change in weight, increased thirst Hematologic/Lymphatic:  No anemia, purpura, petechiae. Allergic/Immunologic: no itchy/runny eyes, nasal congestion, recent allergic reactions, rashes  PHYSICAL EXAM: Vitals:   07/14/17 0959  BP: 140/80  Pulse: 70  SpO2: 98%   General: No acute distress Head:  Normocephalic/atraumatic Neck: supple, no paraspinal tenderness, full range of motion Heart:  Regular rate and rhythm Lungs:  Clear to auscultation bilaterally Back: No paraspinal tenderness Skin/Extremities: No rash, no edema Neurological Exam: alert and oriented to person, place, and time. No aphasia or dysarthria. Fund of knowledge is appropriate.  Recent and remote memory are intact. 3/3 delayed recall.  Attention and concentration are normal, able to spell WORLD forward and backward.    Able to name objects and repeat phrases. Cranial nerves: Pupils equal, round, reactive to light.  Extraocular movements intact with no nystagmus. Visual fields full. Facial sensation intact. No facial asymmetry. Tongue, uvula, palate midline.  Motor: Bulk and tone normal, muscle strength 5/5 throughout with no pronator drift.  Sensation to light touch intact.  No extinction to double simultaneous stimulation.  Deep tendon reflexes +1 throughout except for +2 left UE, toes downgoing.  Finger to nose testing intact.  Gait narrow-based and steady, difficulty with tandem walk (similar to prior).  Romberg positive.  IMPRESSION: This is a 62 yo RH woman with a history of history of hypertension, diabetes, sleep apnea on CPAP, sarcoidosis, B12 deficiency, hypothyroidism, anxiety, migraines, who had a transient episode of loss of awareness last  07/24/2015.The etiology of the episode is unclear, MRI brain and 1-hour EEG were normal. No further episodes since 2017. She has been followed in the Neurology clinic since then for headaches, likely tension-type with cervicogenic component, as well as diabetic neuropathy. She reports more headaches with decrease in Lyrica, uptitrate back to 150mg  in AM, 300mg  in PM. We also discussed how depression and anxiety can cause a lot of her other symptoms including poor sleep and cognitive changes. She will discuss restarting Zoloft with her PCP. We discussed how neck surgery may be helpful with neck pain since she has tried other treatment modalities with no improvement. Follow-up in 6 months and knows to call for any changes.   Thank you for allowing me to participate in her care.  Please do not hesitate to call for any questions or concerns.  The duration of this appointment visit was 30 minutes of face-to-face time with the patient.  Greater than 50% of this time was spent in counseling, explanation of diagnosis, planning of further management, and coordination of care.   Ellouise Newer, M.D.   CC: Dr. Megan Salon

## 2017-07-14 NOTE — Patient Instructions (Signed)
1. Increase Lyrica to 150mg : take 1 cap twice a day for a week, then increase to 1 cap in AM, 2 caps in PM  2. Discuss restarting Zoloft with your PCP  3. Proceed with neck surgery as planned, continue PT  4. Follow-up in 6 months, call for any changes

## 2017-07-15 DIAGNOSIS — F329 Major depressive disorder, single episode, unspecified: Secondary | ICD-10-CM | POA: Diagnosis not present

## 2017-07-15 DIAGNOSIS — Z6841 Body Mass Index (BMI) 40.0 and over, adult: Secondary | ICD-10-CM | POA: Diagnosis not present

## 2017-07-15 DIAGNOSIS — F419 Anxiety disorder, unspecified: Secondary | ICD-10-CM | POA: Diagnosis not present

## 2017-07-16 DIAGNOSIS — M542 Cervicalgia: Secondary | ICD-10-CM | POA: Diagnosis not present

## 2017-07-16 DIAGNOSIS — M5412 Radiculopathy, cervical region: Secondary | ICD-10-CM | POA: Diagnosis not present

## 2017-07-19 DIAGNOSIS — M79671 Pain in right foot: Secondary | ICD-10-CM | POA: Diagnosis not present

## 2017-07-19 DIAGNOSIS — M779 Enthesopathy, unspecified: Secondary | ICD-10-CM | POA: Diagnosis not present

## 2017-07-19 DIAGNOSIS — R2689 Other abnormalities of gait and mobility: Secondary | ICD-10-CM | POA: Diagnosis not present

## 2017-07-19 DIAGNOSIS — M5432 Sciatica, left side: Secondary | ICD-10-CM | POA: Diagnosis not present

## 2017-07-19 DIAGNOSIS — R2681 Unsteadiness on feet: Secondary | ICD-10-CM | POA: Diagnosis not present

## 2017-07-21 DIAGNOSIS — M5412 Radiculopathy, cervical region: Secondary | ICD-10-CM | POA: Diagnosis not present

## 2017-07-21 DIAGNOSIS — M542 Cervicalgia: Secondary | ICD-10-CM | POA: Diagnosis not present

## 2017-07-26 DIAGNOSIS — R2689 Other abnormalities of gait and mobility: Secondary | ICD-10-CM | POA: Diagnosis not present

## 2017-07-26 DIAGNOSIS — R2681 Unsteadiness on feet: Secondary | ICD-10-CM | POA: Diagnosis not present

## 2017-07-26 DIAGNOSIS — M5432 Sciatica, left side: Secondary | ICD-10-CM | POA: Diagnosis not present

## 2017-07-26 DIAGNOSIS — M779 Enthesopathy, unspecified: Secondary | ICD-10-CM | POA: Diagnosis not present

## 2017-07-26 DIAGNOSIS — M79671 Pain in right foot: Secondary | ICD-10-CM | POA: Diagnosis not present

## 2017-07-27 DIAGNOSIS — G4733 Obstructive sleep apnea (adult) (pediatric): Secondary | ICD-10-CM | POA: Diagnosis not present

## 2017-07-28 DIAGNOSIS — R2689 Other abnormalities of gait and mobility: Secondary | ICD-10-CM | POA: Diagnosis not present

## 2017-07-28 DIAGNOSIS — M5432 Sciatica, left side: Secondary | ICD-10-CM | POA: Diagnosis not present

## 2017-07-28 DIAGNOSIS — M779 Enthesopathy, unspecified: Secondary | ICD-10-CM | POA: Diagnosis not present

## 2017-07-28 DIAGNOSIS — R2681 Unsteadiness on feet: Secondary | ICD-10-CM | POA: Diagnosis not present

## 2017-07-28 DIAGNOSIS — M79671 Pain in right foot: Secondary | ICD-10-CM | POA: Diagnosis not present

## 2017-07-31 DIAGNOSIS — M549 Dorsalgia, unspecified: Secondary | ICD-10-CM | POA: Diagnosis not present

## 2017-07-31 DIAGNOSIS — G629 Polyneuropathy, unspecified: Secondary | ICD-10-CM | POA: Diagnosis not present

## 2017-07-31 DIAGNOSIS — E1149 Type 2 diabetes mellitus with other diabetic neurological complication: Secondary | ICD-10-CM | POA: Diagnosis not present

## 2017-07-31 DIAGNOSIS — G4733 Obstructive sleep apnea (adult) (pediatric): Secondary | ICD-10-CM | POA: Diagnosis not present

## 2017-07-31 DIAGNOSIS — G5600 Carpal tunnel syndrome, unspecified upper limb: Secondary | ICD-10-CM | POA: Diagnosis not present

## 2017-07-31 DIAGNOSIS — D860 Sarcoidosis of lung: Secondary | ICD-10-CM | POA: Diagnosis not present

## 2017-08-03 DIAGNOSIS — R2689 Other abnormalities of gait and mobility: Secondary | ICD-10-CM | POA: Diagnosis not present

## 2017-08-03 DIAGNOSIS — M79671 Pain in right foot: Secondary | ICD-10-CM | POA: Diagnosis not present

## 2017-08-03 DIAGNOSIS — M779 Enthesopathy, unspecified: Secondary | ICD-10-CM | POA: Diagnosis not present

## 2017-08-03 DIAGNOSIS — R2681 Unsteadiness on feet: Secondary | ICD-10-CM | POA: Diagnosis not present

## 2017-08-03 DIAGNOSIS — M5432 Sciatica, left side: Secondary | ICD-10-CM | POA: Diagnosis not present

## 2017-08-05 ENCOUNTER — Other Ambulatory Visit: Payer: Self-pay | Admitting: Allergy and Immunology

## 2017-08-06 DIAGNOSIS — Z6841 Body Mass Index (BMI) 40.0 and over, adult: Secondary | ICD-10-CM | POA: Diagnosis not present

## 2017-08-06 DIAGNOSIS — R109 Unspecified abdominal pain: Secondary | ICD-10-CM | POA: Diagnosis not present

## 2017-08-10 DIAGNOSIS — R2681 Unsteadiness on feet: Secondary | ICD-10-CM | POA: Diagnosis not present

## 2017-08-10 DIAGNOSIS — M79671 Pain in right foot: Secondary | ICD-10-CM | POA: Diagnosis not present

## 2017-08-10 DIAGNOSIS — M5432 Sciatica, left side: Secondary | ICD-10-CM | POA: Diagnosis not present

## 2017-08-10 DIAGNOSIS — R2689 Other abnormalities of gait and mobility: Secondary | ICD-10-CM | POA: Diagnosis not present

## 2017-08-10 DIAGNOSIS — M779 Enthesopathy, unspecified: Secondary | ICD-10-CM | POA: Diagnosis not present

## 2017-08-12 DIAGNOSIS — M5432 Sciatica, left side: Secondary | ICD-10-CM | POA: Diagnosis not present

## 2017-08-12 DIAGNOSIS — M779 Enthesopathy, unspecified: Secondary | ICD-10-CM | POA: Diagnosis not present

## 2017-08-12 DIAGNOSIS — R2689 Other abnormalities of gait and mobility: Secondary | ICD-10-CM | POA: Diagnosis not present

## 2017-08-12 DIAGNOSIS — M79671 Pain in right foot: Secondary | ICD-10-CM | POA: Diagnosis not present

## 2017-08-12 DIAGNOSIS — R2681 Unsteadiness on feet: Secondary | ICD-10-CM | POA: Diagnosis not present

## 2017-08-16 DIAGNOSIS — M5030 Other cervical disc degeneration, unspecified cervical region: Secondary | ICD-10-CM | POA: Diagnosis not present

## 2017-08-16 DIAGNOSIS — M5412 Radiculopathy, cervical region: Secondary | ICD-10-CM | POA: Diagnosis not present

## 2017-08-17 DIAGNOSIS — M779 Enthesopathy, unspecified: Secondary | ICD-10-CM | POA: Diagnosis not present

## 2017-08-17 DIAGNOSIS — R2681 Unsteadiness on feet: Secondary | ICD-10-CM | POA: Diagnosis not present

## 2017-08-17 DIAGNOSIS — R2689 Other abnormalities of gait and mobility: Secondary | ICD-10-CM | POA: Diagnosis not present

## 2017-08-17 DIAGNOSIS — M79671 Pain in right foot: Secondary | ICD-10-CM | POA: Diagnosis not present

## 2017-08-17 DIAGNOSIS — M5432 Sciatica, left side: Secondary | ICD-10-CM | POA: Diagnosis not present

## 2017-08-18 DIAGNOSIS — Z794 Long term (current) use of insulin: Secondary | ICD-10-CM | POA: Diagnosis not present

## 2017-08-18 DIAGNOSIS — H5203 Hypermetropia, bilateral: Secondary | ICD-10-CM | POA: Diagnosis not present

## 2017-08-18 DIAGNOSIS — I10 Essential (primary) hypertension: Secondary | ICD-10-CM | POA: Diagnosis not present

## 2017-08-18 DIAGNOSIS — Z7984 Long term (current) use of oral hypoglycemic drugs: Secondary | ICD-10-CM | POA: Diagnosis not present

## 2017-08-18 DIAGNOSIS — H25813 Combined forms of age-related cataract, bilateral: Secondary | ICD-10-CM | POA: Diagnosis not present

## 2017-08-18 DIAGNOSIS — H52223 Regular astigmatism, bilateral: Secondary | ICD-10-CM | POA: Diagnosis not present

## 2017-08-18 DIAGNOSIS — E119 Type 2 diabetes mellitus without complications: Secondary | ICD-10-CM | POA: Diagnosis not present

## 2017-08-18 DIAGNOSIS — H524 Presbyopia: Secondary | ICD-10-CM | POA: Diagnosis not present

## 2017-08-19 DIAGNOSIS — M5412 Radiculopathy, cervical region: Secondary | ICD-10-CM | POA: Diagnosis not present

## 2017-08-19 DIAGNOSIS — M542 Cervicalgia: Secondary | ICD-10-CM | POA: Diagnosis not present

## 2017-08-23 ENCOUNTER — Telehealth: Payer: Self-pay | Admitting: Sports Medicine

## 2017-08-23 NOTE — Telephone Encounter (Signed)
Shoes cancelled paperwork signed by PA

## 2017-08-23 NOTE — Telephone Encounter (Signed)
Pt left voicemail at 108pm checking on the status of her shoes.

## 2017-08-24 ENCOUNTER — Encounter: Payer: Self-pay | Admitting: Cardiology

## 2017-08-24 DIAGNOSIS — M79671 Pain in right foot: Secondary | ICD-10-CM | POA: Diagnosis not present

## 2017-08-24 DIAGNOSIS — Z01818 Encounter for other preprocedural examination: Secondary | ICD-10-CM | POA: Diagnosis not present

## 2017-08-24 DIAGNOSIS — Z6841 Body Mass Index (BMI) 40.0 and over, adult: Secondary | ICD-10-CM | POA: Diagnosis not present

## 2017-08-24 DIAGNOSIS — M779 Enthesopathy, unspecified: Secondary | ICD-10-CM | POA: Diagnosis not present

## 2017-08-24 DIAGNOSIS — R2689 Other abnormalities of gait and mobility: Secondary | ICD-10-CM | POA: Diagnosis not present

## 2017-08-24 DIAGNOSIS — R0789 Other chest pain: Secondary | ICD-10-CM | POA: Diagnosis not present

## 2017-08-24 DIAGNOSIS — Z1339 Encounter for screening examination for other mental health and behavioral disorders: Secondary | ICD-10-CM | POA: Diagnosis not present

## 2017-08-24 DIAGNOSIS — R2681 Unsteadiness on feet: Secondary | ICD-10-CM | POA: Diagnosis not present

## 2017-08-24 DIAGNOSIS — M5432 Sciatica, left side: Secondary | ICD-10-CM | POA: Diagnosis not present

## 2017-08-24 DIAGNOSIS — E1140 Type 2 diabetes mellitus with diabetic neuropathy, unspecified: Secondary | ICD-10-CM | POA: Diagnosis not present

## 2017-08-24 DIAGNOSIS — E119 Type 2 diabetes mellitus without complications: Secondary | ICD-10-CM | POA: Diagnosis not present

## 2017-08-26 DIAGNOSIS — M5412 Radiculopathy, cervical region: Secondary | ICD-10-CM | POA: Diagnosis not present

## 2017-08-26 DIAGNOSIS — M542 Cervicalgia: Secondary | ICD-10-CM | POA: Diagnosis not present

## 2017-08-30 ENCOUNTER — Other Ambulatory Visit: Payer: Self-pay

## 2017-08-30 MED ORDER — PREGABALIN 150 MG PO CAPS: ORAL_CAPSULE | ORAL | 3 refills | Status: DC

## 2017-08-31 DIAGNOSIS — M549 Dorsalgia, unspecified: Secondary | ICD-10-CM | POA: Diagnosis not present

## 2017-08-31 DIAGNOSIS — R2681 Unsteadiness on feet: Secondary | ICD-10-CM | POA: Diagnosis not present

## 2017-08-31 DIAGNOSIS — E1149 Type 2 diabetes mellitus with other diabetic neurological complication: Secondary | ICD-10-CM | POA: Diagnosis not present

## 2017-08-31 DIAGNOSIS — M779 Enthesopathy, unspecified: Secondary | ICD-10-CM | POA: Diagnosis not present

## 2017-08-31 DIAGNOSIS — G5600 Carpal tunnel syndrome, unspecified upper limb: Secondary | ICD-10-CM | POA: Diagnosis not present

## 2017-08-31 DIAGNOSIS — D860 Sarcoidosis of lung: Secondary | ICD-10-CM | POA: Diagnosis not present

## 2017-08-31 DIAGNOSIS — M5432 Sciatica, left side: Secondary | ICD-10-CM | POA: Diagnosis not present

## 2017-08-31 DIAGNOSIS — R2689 Other abnormalities of gait and mobility: Secondary | ICD-10-CM | POA: Diagnosis not present

## 2017-08-31 DIAGNOSIS — G629 Polyneuropathy, unspecified: Secondary | ICD-10-CM | POA: Diagnosis not present

## 2017-08-31 DIAGNOSIS — M79671 Pain in right foot: Secondary | ICD-10-CM | POA: Diagnosis not present

## 2017-09-02 DIAGNOSIS — M5412 Radiculopathy, cervical region: Secondary | ICD-10-CM | POA: Diagnosis not present

## 2017-09-02 DIAGNOSIS — M542 Cervicalgia: Secondary | ICD-10-CM | POA: Diagnosis not present

## 2017-09-07 DIAGNOSIS — M5432 Sciatica, left side: Secondary | ICD-10-CM | POA: Diagnosis not present

## 2017-09-07 DIAGNOSIS — M79671 Pain in right foot: Secondary | ICD-10-CM | POA: Diagnosis not present

## 2017-09-07 DIAGNOSIS — M779 Enthesopathy, unspecified: Secondary | ICD-10-CM | POA: Diagnosis not present

## 2017-09-07 DIAGNOSIS — R2689 Other abnormalities of gait and mobility: Secondary | ICD-10-CM | POA: Diagnosis not present

## 2017-09-07 DIAGNOSIS — Z0100 Encounter for examination of eyes and vision without abnormal findings: Secondary | ICD-10-CM | POA: Diagnosis not present

## 2017-09-07 DIAGNOSIS — R2681 Unsteadiness on feet: Secondary | ICD-10-CM | POA: Diagnosis not present

## 2017-09-08 DIAGNOSIS — L0390 Cellulitis, unspecified: Secondary | ICD-10-CM | POA: Diagnosis not present

## 2017-09-08 DIAGNOSIS — L729 Follicular cyst of the skin and subcutaneous tissue, unspecified: Secondary | ICD-10-CM | POA: Diagnosis not present

## 2017-09-08 DIAGNOSIS — Z6841 Body Mass Index (BMI) 40.0 and over, adult: Secondary | ICD-10-CM | POA: Diagnosis not present

## 2017-09-09 DIAGNOSIS — M5412 Radiculopathy, cervical region: Secondary | ICD-10-CM | POA: Diagnosis not present

## 2017-09-09 DIAGNOSIS — M542 Cervicalgia: Secondary | ICD-10-CM | POA: Diagnosis not present

## 2017-09-13 ENCOUNTER — Encounter: Payer: Self-pay | Admitting: Cardiology

## 2017-09-13 DIAGNOSIS — I209 Angina pectoris, unspecified: Secondary | ICD-10-CM | POA: Insufficient documentation

## 2017-09-16 DIAGNOSIS — M542 Cervicalgia: Secondary | ICD-10-CM | POA: Diagnosis not present

## 2017-09-16 DIAGNOSIS — M5412 Radiculopathy, cervical region: Secondary | ICD-10-CM | POA: Diagnosis not present

## 2017-09-21 ENCOUNTER — Ambulatory Visit (INDEPENDENT_AMBULATORY_CARE_PROVIDER_SITE_OTHER): Payer: Medicare HMO | Admitting: Cardiology

## 2017-09-21 ENCOUNTER — Encounter: Payer: Self-pay | Admitting: Cardiology

## 2017-09-21 VITALS — BP 116/60 | HR 75 | Ht 60.0 in | Wt 211.8 lb

## 2017-09-21 DIAGNOSIS — R079 Chest pain, unspecified: Secondary | ICD-10-CM

## 2017-09-21 DIAGNOSIS — E782 Mixed hyperlipidemia: Secondary | ICD-10-CM | POA: Diagnosis not present

## 2017-09-21 DIAGNOSIS — Z6841 Body Mass Index (BMI) 40.0 and over, adult: Secondary | ICD-10-CM | POA: Diagnosis not present

## 2017-09-21 DIAGNOSIS — R05 Cough: Secondary | ICD-10-CM | POA: Diagnosis not present

## 2017-09-21 DIAGNOSIS — E088 Diabetes mellitus due to underlying condition with unspecified complications: Secondary | ICD-10-CM

## 2017-09-21 DIAGNOSIS — Z01810 Encounter for preprocedural cardiovascular examination: Secondary | ICD-10-CM

## 2017-09-21 DIAGNOSIS — R918 Other nonspecific abnormal finding of lung field: Secondary | ICD-10-CM | POA: Diagnosis not present

## 2017-09-21 DIAGNOSIS — I209 Angina pectoris, unspecified: Secondary | ICD-10-CM

## 2017-09-21 DIAGNOSIS — J069 Acute upper respiratory infection, unspecified: Secondary | ICD-10-CM | POA: Diagnosis not present

## 2017-09-21 DIAGNOSIS — I10 Essential (primary) hypertension: Secondary | ICD-10-CM | POA: Diagnosis not present

## 2017-09-21 DIAGNOSIS — Z01818 Encounter for other preprocedural examination: Secondary | ICD-10-CM | POA: Diagnosis not present

## 2017-09-21 DIAGNOSIS — R0602 Shortness of breath: Secondary | ICD-10-CM | POA: Diagnosis not present

## 2017-09-21 DIAGNOSIS — J309 Allergic rhinitis, unspecified: Secondary | ICD-10-CM | POA: Diagnosis not present

## 2017-09-21 MED ORDER — NITROGLYCERIN 0.4 MG SL SUBL: 0.4000 mg | SUBLINGUAL_TABLET | SUBLINGUAL | 11 refills | Status: DC | PRN

## 2017-09-21 NOTE — Addendum Note (Signed)
Addended by: Mattie Marlin on: 09/21/2017 03:41 PM   Modules accepted: Orders

## 2017-09-21 NOTE — H&P (View-Only) (Signed)
Cardiology Office Note:    Date:  09/21/2017   ID:  Melanie Meyers, DOB [birth year], MRN [id number]      PCP:  Janine Limbo, PA-C  Cardiologist:  Jenean Lindau, MD   Referring MD: Nicholos Johns, MD    ASSESSMENT:    1. Angina pectoris ([place])   2. Chest pain, unspecified type   3. Essential hypertension   4. Mixed hyperlipidemia   5. Diabetes mellitus due to underlying condition with complication, without long-term current use of insulin ([place])   6. Pre-operative cardiovascular examination    PLAN:    In order of problems listed above:  1. Primary prevention stressed to the patient.  Importance of compliance with diet and medications and she vocalized understanding 2. She has multiple risk factors for coronary artery disease.  Her symptoms are very concerning. 3. In view of the patient's symptoms, I discussed with the patient options for evaluation. Invasive and noninvasive options were given to the patient. I discussed stress testing and coronary angiography and left heart catheterization at length. Benefits, pros and cons of each approach were discussed at length. Patient had multiple questions which were answered to the patient's satisfaction. Patient opted for invasive evaluation and we will set up for coronary angiography and left heart catheterization. Further recommendations will be made based on the findings with coronary angiography. In the interim if the patient has any significant symptoms in hospital to the nearest emergency room. 4. Sublingual nitroglycerin prescription was sent, its protocol and 911 protocol explained and the patient vocalized understanding questions were answered to the patient's satisfaction   Medication Adjustments/Labs and Tests Ordered: Current medicines are reviewed at length with the patient today.  Concerns regarding medicines are outlined above.  Orders Placed This Encounter  Procedures  . EKG 12-Lead   No orders of the defined  types were placed in this encounter.    History of Present Illness:    Melanie Meyers is a 62 y.o. female who is being seen today for the evaluation of chest tightness on exertion.  Cardiovascular evaluation at the request of Nicholos Johns, MD.  Patient is a pleasant 62 year old female she has past medical history of essential hypertension, diabetes mellitus, dyslipidemia and morbid obesity.  She has sleep apnea and pulmonary stenosis.  She mentions to me that she has been exposed significantly to smoke in the past.  She mentions to me that she plans to undergo surgery with orthopedic issues.  She is here for preop assessment for neck surgery.  Patient mentions to me that she has chest tightness on exertion consistent when she stops walking.  Sometimes it goes to the arms.  She is also concerned his symptoms.  At the time of my evaluation, the patient is alert awake oriented and in no distress.  Past Medical History:  Diagnosis Date  . Asthma   . B12 deficiency   . Back pain   . Carpal tunnel syndrome of right wrist   . Degenerative arthritis    degenerative arthritis of neck  . Depression   . Diabetes mellitus without complication ([place])   . Fibromyalgia   . GERD (gastroesophageal reflux disease)   . High cholesterol   . Hypertension   . Hypothyroid   . Insomnia   . Migraine   . Migraines   . Neuropathy   . Neuropathy   . OSA on CPAP   . Sarcoidosis   . Sleep apnea   . Thyroid disease   .  Vitamin D deficiency     Past Surgical History:  Procedure Laterality Date  . BREAST REDUCTION SURGERY  02/18/2015  . CARPAL TUNNEL RELEASE  2014  . CARPAL TUNNEL RELEASE  04/2016  . [place]  . NASAL POLYP SURGERY  1983  . SINOSCOPY  10/09/2014  . TONSILLECTOMY  1978  . TUBAL LIGATION      Current Medications: Current Meds  Medication Sig  . ACCU-CHEK AVIVA PLUS test strip 1 each by Other route as needed (for BS).   Marland Kitchen albuterol (VENTOLIN HFA) 108  (90 Base) MCG/ACT inhaler Inhale two puffs every four to six hours as needed for cough or wheeze.  Marland Kitchen aspirin EC 81 MG tablet Take 81 mg by mouth at bedtime.   Marland Kitchen BIOTIN PO Take 5,000 mcg by mouth at bedtime.   . budesonide-formoterol (SYMBICORT) 160-4.5 MCG/ACT inhaler Inhale two puffs twice daily with spacer to prevent cough or wheeze.  Rinse, gargle, and spit after use.  . cetirizine (ZYRTEC) 10 MG tablet Take 10 mg by mouth at bedtime.   . Insulin Glargine-Lixisenatide (SOLIQUA [place]) Inject into the skin.  Marland Kitchen levothyroxine (SYNTHROID, LEVOTHROID) 25 MCG tablet Take 25 mcg by mouth daily before breakfast.  . loratadine (CLARITIN) 10 MG tablet Take 10 mg by mouth daily.  Marland Kitchen losartan (COZAAR) 25 MG tablet Take 25 mg by mouth at bedtime.   Marland Kitchen MAGNESIUM PO Take by mouth daily.  . meloxicam (MOBIC) 15 MG tablet meloxicam 15 mg tablet  . metFORMIN (GLUCOPHAGE) 1000 MG tablet Take 1,000 mg by mouth 2 (two) times daily with a meal.   . methocarbamol (ROBAXIN) 500 MG tablet methocarbamol 500 mg tablet  . montelukast (SINGULAIR) 10 MG tablet Take 10 mg by mouth at bedtime.   . polyethylene glycol (MIRALAX / GLYCOLAX) packet Take 17 g by mouth daily with breakfast.   . pravastatin (PRAVACHOL) 40 MG tablet Take 40 mg by mouth at bedtime.  . pregabalin (LYRICA) 150 MG capsule Take  1 cap in AM, 2 caps in PM and continue  . ranitidine (ZANTAC) 300 MG tablet TAKE 1 TABLET AT BEDTIME  . rizatriptan (MAXALT) 10 MG tablet Take 10 mg by mouth as needed.   Marland Kitchen UNIFINE PENTIPS 31G X 6 MM MISC   . zolpidem (AMBIEN) 10 MG tablet Take 10 mg by mouth at bedtime.    Current Facility-Administered Medications for the 09/21/17 encounter (Office Visit) with Marcayla Budge, Reita Cliche, MD  Medication  . triamcinolone acetonide (KENALOG) 10 MG/ML injection 10 mg  . triamcinolone acetonide (KENALOG) 10 MG/ML injection 10 mg     Allergies:   Accupril [quinapril hcl]; Codeine; Neurontin [gabapentin]; Prednisone; Topamax [topiramate]; and  Levaquin [levofloxacin]   Social History   Socioeconomic History  . Marital status: Widowed    Spouse name: Not on file  . Number of children: Not on file  . Years of education: Not on file  . Highest education level: Not on file  Occupational History  . Not on file  Social Needs  . Financial resource strain: Not on file  . Food insecurity:    Worry: Not on file    Inability: Not on file  . Transportation needs:    Medical: Not on file    Non-medical: Not on file  Tobacco Use  . Smoking status: Never Smoker  . Smokeless tobacco: Never Used  Substance and Sexual Activity  . Alcohol use: No    Alcohol/week: 0.0 standard drinks  . Drug  use: No  . Sexual activity: Not on file  Lifestyle  . Physical activity:    Days per week: Not on file    Minutes per session: Not on file  . Stress: Not on file  Relationships  . Social connections:    Talks on phone: Not on file    Gets together: Not on file    Attends religious service: Not on file    Active member of club or organization: Not on file    Attends meetings of clubs or organizations: Not on file    Relationship status: Not on file  Other Topics Concern  . Not on file  Social History Narrative  . Not on file     Family History: The patient's family history includes Alzheimer's disease in her mother; Diabetes in her mother; Gout in her mother; Heart attack in her father and sister; High Cholesterol in her father; High blood pressure in her father; Parkinson's disease in her mother; Thyroid disease in her sister, sister, and sister.  ROS:   Please see the history of present illness.    All other systems reviewed and are negative.  EKGs/Labs/Other Studies Reviewed:    The following studies were reviewed today: EKG reveals sinus rhythm and nonspecific ST changes.  I reviewed primary care physician records extensively.   Recent Labs: No results found for requested labs within last 8760 hours.  Recent Lipid Panel No  results found for: CHOL, TRIG, HDL, CHOLHDL, VLDL, LDLCALC, LDLDIRECT  Physical Exam:    VS:  BP 116/60 (BP Location: Right Arm, Patient Position: Sitting, Cuff Size: Normal)   Pulse 75   Ht 5' (1.524 m)   Wt 211 lb 12.8 oz (96.1 kg)   LMP  (LMP Unknown)   SpO2 97%   BMI 41.36 kg/m     Wt Readings from Last 3 Encounters:  09/21/17 211 lb 12.8 oz (96.1 kg)  07/14/17 216 lb 8 oz (98.2 kg)  01/12/17 216 lb (98 kg)     GEN: Patient is in no acute distress HEENT: Normal NECK: No JVD; No carotid bruits LYMPHATICS: No lymphadenopathy CARDIAC: S1 S2 regular, 2/6 systolic murmur at the apex. RESPIRATORY:  Clear to auscultation without rales, wheezing or rhonchi  ABDOMEN: Soft, non-tender, non-distended MUSCULOSKELETAL:  No edema; No deformity  SKIN: Warm and dry NEUROLOGIC:  Alert and oriented x 3 PSYCHIATRIC:  Normal affect    Signed, Jenean Lindau, MD  09/21/2017 3:13 PM    [facility]

## 2017-09-21 NOTE — Progress Notes (Signed)
Cardiology Office Note:    Date:  09/21/2017   ID:  Melanie Meyers, DOB 1955/06/21, MRN [id number]      PCP:  Janine Limbo, PA-C  Cardiologist:  Jenean Lindau, MD   Referring MD: Nicholos Johns, MD    ASSESSMENT:    1. Angina pectoris ([place])   2. Chest pain, unspecified type   3. Essential hypertension   4. Mixed hyperlipidemia   5. Diabetes mellitus due to underlying condition with complication, without long-term current use of insulin ([place])   6. Pre-operative cardiovascular examination    PLAN:    In order of problems listed above:  1. Primary prevention stressed to the patient.  Importance of compliance with diet and medications and she vocalized understanding 2. She has multiple risk factors for coronary artery disease.  Her symptoms are very concerning. 3. In view of the patient's symptoms, I discussed with the patient options for evaluation. Invasive and noninvasive options were given to the patient. I discussed stress testing and coronary angiography and left heart catheterization at length. Benefits, pros and cons of each approach were discussed at length. Patient had multiple questions which were answered to the patient's satisfaction. Patient opted for invasive evaluation and we will set up for coronary angiography and left heart catheterization. Further recommendations will be made based on the findings with coronary angiography. In the interim if the patient has any significant symptoms in hospital to the nearest emergency room. 4. Sublingual nitroglycerin prescription was sent, its protocol and 911 protocol explained and the patient vocalized understanding questions were answered to the patient's satisfaction   Medication Adjustments/Labs and Tests Ordered: Current medicines are reviewed at length with the patient today.  Concerns regarding medicines are outlined above.  Orders Placed This Encounter  Procedures  . EKG 12-Lead   No orders of the defined  types were placed in this encounter.    History of Present Illness:    Melanie Meyers is a 62 y.o. female who is being seen today for the evaluation of chest tightness on exertion.  Cardiovascular evaluation at the request of Nicholos Johns, MD.  Patient is a pleasant 62 year old female she has past medical history of essential hypertension, diabetes mellitus, dyslipidemia and morbid obesity.  She has sleep apnea and pulmonary stenosis.  She mentions to me that she has been exposed significantly to smoke in the past.  She mentions to me that she plans to undergo surgery with orthopedic issues.  She is here for preop assessment for neck surgery.  Patient mentions to me that she has chest tightness on exertion consistent when she stops walking.  Sometimes it goes to the arms.  She is also concerned his symptoms.  At the time of my evaluation, the patient is alert awake oriented and in no distress.  Past Medical History:  Diagnosis Date  . Asthma   . B12 deficiency   . Back pain   . Carpal tunnel syndrome of right wrist   . Degenerative arthritis    degenerative arthritis of neck  . Depression   . Diabetes mellitus without complication ([place])   . Fibromyalgia   . GERD (gastroesophageal reflux disease)   . High cholesterol   . Hypertension   . Hypothyroid   . Insomnia   . Migraine   . Migraines   . Neuropathy   . Neuropathy   . OSA on CPAP   . Sarcoidosis   . Sleep apnea   . Thyroid disease   .  Vitamin D deficiency     Past Surgical History:  Procedure Laterality Date  . BREAST REDUCTION SURGERY  02/18/2015  . CARPAL TUNNEL RELEASE  2014  . CARPAL TUNNEL RELEASE  04/2016  . [place]  . NASAL POLYP SURGERY  1983  . SINOSCOPY  10/09/2014  . TONSILLECTOMY  1978  . TUBAL LIGATION      Current Medications: Current Meds  Medication Sig  . ACCU-CHEK AVIVA PLUS test strip 1 each by Other route as needed (for BS).   Marland Kitchen albuterol (VENTOLIN HFA) 108  (90 Base) MCG/ACT inhaler Inhale two puffs every four to six hours as needed for cough or wheeze.  Marland Kitchen aspirin EC 81 MG tablet Take 81 mg by mouth at bedtime.   Marland Kitchen BIOTIN PO Take 5,000 mcg by mouth at bedtime.   . budesonide-formoterol (SYMBICORT) 160-4.5 MCG/ACT inhaler Inhale two puffs twice daily with spacer to prevent cough or wheeze.  Rinse, gargle, and spit after use.  . cetirizine (ZYRTEC) 10 MG tablet Take 10 mg by mouth at bedtime.   . Insulin Glargine-Lixisenatide (SOLIQUA [place]) Inject into the skin.  Marland Kitchen levothyroxine (SYNTHROID, LEVOTHROID) 25 MCG tablet Take 25 mcg by mouth daily before breakfast.  . loratadine (CLARITIN) 10 MG tablet Take 10 mg by mouth daily.  Marland Kitchen losartan (COZAAR) 25 MG tablet Take 25 mg by mouth at bedtime.   Marland Kitchen MAGNESIUM PO Take by mouth daily.  . meloxicam (MOBIC) 15 MG tablet meloxicam 15 mg tablet  . metFORMIN (GLUCOPHAGE) 1000 MG tablet Take 1,000 mg by mouth 2 (two) times daily with a meal.   . methocarbamol (ROBAXIN) 500 MG tablet methocarbamol 500 mg tablet  . montelukast (SINGULAIR) 10 MG tablet Take 10 mg by mouth at bedtime.   . polyethylene glycol (MIRALAX / GLYCOLAX) packet Take 17 g by mouth daily with breakfast.   . pravastatin (PRAVACHOL) 40 MG tablet Take 40 mg by mouth at bedtime.  . pregabalin (LYRICA) 150 MG capsule Take  1 cap in AM, 2 caps in PM and continue  . ranitidine (ZANTAC) 300 MG tablet TAKE 1 TABLET AT BEDTIME  . rizatriptan (MAXALT) 10 MG tablet Take 10 mg by mouth as needed.   Marland Kitchen UNIFINE PENTIPS 31G X 6 MM MISC   . zolpidem (AMBIEN) 10 MG tablet Take 10 mg by mouth at bedtime.    Current Facility-Administered Medications for the 09/21/17 encounter (Office Visit) with Revankar, Reita Cliche, MD  Medication  . triamcinolone acetonide (KENALOG) 10 MG/ML injection 10 mg  . triamcinolone acetonide (KENALOG) 10 MG/ML injection 10 mg     Allergies:   Accupril [quinapril hcl]; Codeine; Neurontin [gabapentin]; Prednisone; Topamax [topiramate]; and  Levaquin [levofloxacin]   Social History   Socioeconomic History  . Marital status: Widowed    Spouse name: Not on file  . Number of children: Not on file  . Years of education: Not on file  . Highest education level: Not on file  Occupational History  . Not on file  Social Needs  . Financial resource strain: Not on file  . Food insecurity:    Worry: Not on file    Inability: Not on file  . Transportation needs:    Medical: Not on file    Non-medical: Not on file  Tobacco Use  . Smoking status: Never Smoker  . Smokeless tobacco: Never Used  Substance and Sexual Activity  . Alcohol use: No    Alcohol/week: 0.0 standard drinks  . Drug  use: No  . Sexual activity: Not on file  Lifestyle  . Physical activity:    Days per week: Not on file    Minutes per session: Not on file  . Stress: Not on file  Relationships  . Social connections:    Talks on phone: Not on file    Gets together: Not on file    Attends religious service: Not on file    Active member of club or organization: Not on file    Attends meetings of clubs or organizations: Not on file    Relationship status: Not on file  Other Topics Concern  . Not on file  Social History Narrative  . Not on file     Family History: The patient's family history includes Alzheimer's disease in her mother; Diabetes in her mother; Gout in her mother; Heart attack in her father and sister; High Cholesterol in her father; High blood pressure in her father; Parkinson's disease in her mother; Thyroid disease in her sister, sister, and sister.  ROS:   Please see the history of present illness.    All other systems reviewed and are negative.  EKGs/Labs/Other Studies Reviewed:    The following studies were reviewed today: EKG reveals sinus rhythm and nonspecific ST changes.  I reviewed primary care physician records extensively.   Recent Labs: No results found for requested labs within last 8760 hours.  Recent Lipid Panel No  results found for: CHOL, TRIG, HDL, CHOLHDL, VLDL, LDLCALC, LDLDIRECT  Physical Exam:    VS:  BP 116/60 (BP Location: Right Arm, Patient Position: Sitting, Cuff Size: Normal)   Pulse 75   Ht 5' (1.524 m)   Wt 211 lb 12.8 oz (96.1 kg)   LMP  (LMP Unknown)   SpO2 97%   BMI 41.36 kg/m     Wt Readings from Last 3 Encounters:  09/21/17 211 lb 12.8 oz (96.1 kg)  07/14/17 216 lb 8 oz (98.2 kg)  01/12/17 216 lb (98 kg)     GEN: Patient is in no acute distress HEENT: Normal NECK: No JVD; No carotid bruits LYMPHATICS: No lymphadenopathy CARDIAC: S1 S2 regular, 2/6 systolic murmur at the apex. RESPIRATORY:  Clear to auscultation without rales, wheezing or rhonchi  ABDOMEN: Soft, non-tender, non-distended MUSCULOSKELETAL:  No edema; No deformity  SKIN: Warm and dry NEUROLOGIC:  Alert and oriented x 3 PSYCHIATRIC:  Normal affect    Signed, Jenean Lindau, MD  09/21/2017 3:13 PM    [facility]

## 2017-09-21 NOTE — Patient Instructions (Signed)
Medication Instructions:  Your physician has recommended you make the following change in your medication:   START Nitroglycerin 0.4 mg sublingual (under your tongue) as needed for chest pain. If experiencing chest pain, stop what you are doing and sit down. Take 1 nitroglycerin and wait 5 minutes. If chest pain continues, take another nitroglycerin and wait 5 minutes. If chest pain does not subside, take 1 more nitroglycerin and dial 911. You make take a total of 3 nitroglycerin in a 15 minute time frame.  Labwork: Your physician recommends that you have the following labs drawn: CBC and BMP  Testing/Procedures: A chest x-ray takes a picture of the organs and structures inside the chest, including the heart, lungs, and blood vessels. This test can show several things, including, whether the heart is enlarges; whether fluid is building up in the lungs; and whether pacemaker / defibrillator leads are still in place.     [address] [place] [address] Dept: [phone] Loc: [phone]  Kasarah Sitts  09/21/2017  You are scheduled for a Cardiac Catheterization on Wednesday, September 25 with Dr. Peter Martinique.  1. Please arrive at the [facility] (Main Entrance A) at [facility]: [address] [place], [place] [address] at 6:30 AM (This time is two hours before your procedure to ensure your preparation). Free valet parking service is available.   Special note: Every effort is made to have your procedure done on time. Please understand that emergencies sometimes delay scheduled procedures.  2. Diet: Do not eat solid foods after midnight.  The patient may have clear liquids until 5am upon the day of the procedure.  3. Labs: To be drawn today.  4. Medication instructions in preparation for your procedure:  Take only 15 units of insulin the night before your procedure. Do not  take any insulin on the day of the procedure.  Stop Taking PO Diabetes Meds Glucophage (Metformin)on Wednesday, September 25.  On the morning of your procedure, take your Aspirin and any morning medicines NOT listed above.  You may use sips of water.  5. Plan for one night stay--bring personal belongings. 6. Bring a current list of your medications and current insurance cards. 7. You MUST have a responsible person to drive you home. 8. Someone MUST be with you the first 24 hours after you arrive home or your discharge will be delayed. 9. Please wear clothes that are easy to get on and off and wear slip-on shoes.  Thank you for allowing [place] to care for you!   -- [facility]  Follow-Up: Your physician recommends that you schedule a follow-up appointment in: 1 month  Any Other Special Instructions Will Be Listed Below (If Applicable).     If you need a refill on your cardiac medications before your next appointment, please call your pharmacy.   [place], RN, BSN   Coronary Angiogram With Stent Coronary angiogram with stent placement is a procedure to widen or open a narrow blood vessel of the heart (coronary artery). Arteries may become blocked by cholesterol buildup (plaques) in the lining of the wall. When a coronary artery becomes partially blocked, blood flow to that area decreases. This may lead to chest pain or a heart attack (myocardial infarction). A stent is a small piece of metal that looks like mesh or a spring. Stent placement may be done as treatment for  a heart attack or right after a coronary angiogram in which a blocked artery is found. Let your health care provider know about:  Any allergies you have.  All medicines you are taking, including vitamins, herbs, eye drops, creams, and over-the-counter medicines.  Any problems you or family members have had with anesthetic medicines.  Any blood disorders you  have.  Any surgeries you have had.  Any medical conditions you have.  Whether you are pregnant or may be pregnant. What are the risks? Generally, this is a safe procedure. However, problems may occur, including:  Damage to the heart or its blood vessels.  A return of blockage.  Bleeding, infection, or bruising at the insertion site.  A collection of blood under the skin (hematoma) at the insertion site.  A blood clot in another part of the body.  Kidney injury.  Allergic reaction to the dye or contrast that is used.  Bleeding into the abdomen (retroperitoneal bleeding).  What happens before the procedure? Staying hydrated Follow instructions from your health care provider about hydration, which may include:  Up to 2 hours before the procedure - you may continue to drink clear liquids, such as water, clear fruit juice, black coffee, and plain tea.  Eating and drinking restrictions Follow instructions from your health care provider about eating and drinking, which may include:  8 hours before the procedure - stop eating heavy meals or foods such as meat, fried foods, or fatty foods.  6 hours before the procedure - stop eating light meals or foods, such as toast or cereal.  2 hours before the procedure - stop drinking clear liquids.  Ask your health care provider about:  Changing or stopping your regular medicines. This is especially important if you are taking diabetes medicines or blood thinners.  Taking medicines such as ibuprofen. These medicines can thin your blood. Do not take these medicines before your procedure if your health care provider instructs you not to. Generally, aspirin is recommended before a procedure of passing a small, thin tube (catheter) through a blood vessel and into the heart (cardiac catheterization).  What happens during the procedure?  An IV tube will be inserted into one of your veins.  You will be given one or more of the  following: ? A medicine to help you relax (sedative). ? A medicine to numb the area where the catheter will be inserted into an artery (local anesthetic).  To reduce your risk of infection: ? Your health care team will wash or sanitize their hands. ? Your skin will be washed with soap. ? Hair may be removed from the area where the catheter will be inserted.  Using a guide wire, the catheter will be inserted into an artery. The location may be in your groin, in your wrist, or in the fold of your arm (near your elbow).  A type of X-ray (fluoroscopy) will be used to help guide the catheter to the opening of the arteries in the heart.  A dye will be injected into the catheter, and X-rays will be taken. The dye will help to show where any narrowing or blockages are located in the arteries.  A tiny wire will be guided to the blocked spot, and a balloon will be inflated to make the artery wider.  The stent will be expanded and will crush the plaques into the wall of the vessel. The stent will hold the area open and improve the blood flow. Most stents have a drug coating  to reduce the risk of the stent narrowing over time.  The artery may be made wider using a drill, laser, or other tools to remove plaques.  When the blood flow is better, the catheter will be removed. The lining of the artery will grow over the stent, which stays where it was placed. This procedure may vary among health care providers and hospitals. What happens after the procedure?  If the procedure is done through the leg, you will be kept in bed lying flat for about 6 hours. You will be instructed to not bend and not cross your legs.  The insertion site will be checked frequently.  The pulse in your foot or wrist will be checked frequently.  You may have additional blood tests, X-rays, and a test that records the electrical activity of your heart (electrocardiogram, or ECG). This information is not intended to replace  advice given to you by your health care provider. Make sure you discuss any questions you have with your health care provider. Document Released: 06/28/2002 Document Revised: 08/22/2015 Document Reviewed: 07/28/2015 Elsevier Interactive Patient Education  2018 [employer]. Nitroglycerin sublingual tablets What is this medicine? NITROGLYCERIN (nye troe GLI ser in) is a type of vasodilator. It relaxes blood vessels, increasing the blood and oxygen supply to your heart. This medicine is used to relieve chest pain caused by angina. It is also used to prevent chest pain before activities like climbing stairs, going outdoors in cold weather, or sexual activity. This medicine may be used for other purposes; ask your health care provider or pharmacist if you have questions. COMMON BRAND NAME(S): Nitroquick, Nitrostat, Nitrotab What should I tell my health care provider before I take this medicine? They need to know if you have any of these conditions: -anemia -head injury, recent stroke, or bleeding in the brain -liver disease -previous heart attack -an unusual or allergic reaction to nitroglycerin, other medicines, foods, dyes, or preservatives -pregnant or trying to get pregnant -breast-feeding How should I use this medicine? Take this medicine by mouth as needed. At the first sign of an angina attack (chest pain or tightness) place one tablet under your tongue. You can also take this medicine 5 to 10 minutes before an event likely to produce chest pain. Follow the directions on the prescription label. Let the tablet dissolve under the tongue. Do not swallow whole. Replace the dose if you accidentally swallow it. It will help if your mouth is not dry. Saliva around the tablet will help it to dissolve more quickly. Do not eat or drink, smoke or chew tobacco while a tablet is dissolving. If you are not better within 5 minutes after taking ONE dose of nitroglycerin, call 9-1-1 immediately to seek  emergency medical care. Do not take more than 3 nitroglycerin tablets over 15 minutes. If you take this medicine often to relieve symptoms of angina, your doctor or health care professional may provide you with different instructions to manage your symptoms. If symptoms do not go away after following these instructions, it is important to call 9-1-1 immediately. Do not take more than 3 nitroglycerin tablets over 15 minutes. Talk to your pediatrician regarding the use of this medicine in children. Special care may be needed. Overdosage: If you think you have taken too much of this medicine contact a poison control center or emergency room at once. NOTE: This medicine is only for you. Do not share this medicine with others. What if I miss a dose? This does not apply.  This medicine is only used as needed. What may interact with this medicine? Do not take this medicine with any of the following medications: -certain migraine medicines like ergotamine and dihydroergotamine (DHE) -medicines used to treat erectile dysfunction like sildenafil, tadalafil, and vardenafil -riociguat This medicine may also interact with the following medications: -alteplase -aspirin -heparin -medicines for high blood pressure -medicines for mental depression -other medicines used to treat angina -phenothiazines like chlorpromazine, mesoridazine, prochlorperazine, thioridazine This list may not describe all possible interactions. Give your health care provider a list of all the medicines, herbs, non-prescription drugs, or dietary supplements you use. Also tell them if you smoke, drink alcohol, or use illegal drugs. Some items may interact with your medicine. What should I watch for while using this medicine? Tell your doctor or health care professional if you feel your medicine is no longer working. Keep this medicine with you at all times. Sit or lie down when you take your medicine to prevent falling if you feel dizzy or  faint after using it. Try to remain calm. This will help you to feel better faster. If you feel dizzy, take several deep breaths and lie down with your feet propped up, or bend forward with your head resting between your knees. You may get drowsy or dizzy. Do not drive, use machinery, or do anything that needs mental alertness until you know how this drug affects you. Do not stand or sit up quickly, especially if you are an older patient. This reduces the risk of dizzy or fainting spells. Alcohol can make you more drowsy and dizzy. Avoid alcoholic drinks. Do not treat yourself for coughs, colds, or pain while you are taking this medicine without asking your doctor or health care professional for advice. Some ingredients may increase your blood pressure. What side effects may I notice from receiving this medicine? Side effects that you should report to your doctor or health care professional as soon as possible: -blurred vision -dry mouth -skin rash -sweating -the feeling of extreme pressure in the head -unusually weak or tired Side effects that usually do not require medical attention (report to your doctor or health care professional if they continue or are bothersome): -flushing of the face or neck -headache -irregular heartbeat, palpitations -nausea, vomiting This list may not describe all possible side effects. Call your doctor for medical advice about side effects. You may report side effects to FDA at 1-800-FDA-1088. Where should I keep my medicine? Keep out of the reach of children. Store at room temperature between 20 and 25 degrees C (68 and 77 degrees F). Store in [facility] [employer]. Protect from light and moisture. Keep tightly closed. Throw away any unused medicine after the expiration date. NOTE: This sheet is a summary. It may not cover all possible information. If you have questions about this medicine, talk to your doctor, pharmacist, or health care provider.  2018 Elsevier/Gold  Standard (2012-10-20 17:57:36)

## 2017-09-22 LAB — BASIC METABOLIC PANEL
BUN/Creatinine Ratio: 21 (ref 12–28)
BUN: 21 mg/dL (ref 8–27)
CHLORIDE: 103 mmol/L (ref 96–106)
CO2: 23 mmol/L (ref 20–29)
Calcium: 9.6 mg/dL (ref 8.7–10.3)
Creatinine, Ser: 1.02 mg/dL — ABNORMAL HIGH (ref 0.57–1.00)
GFR calc Af Amer: 69 mL/min/{1.73_m2} (ref >59)
GFR calc non Af Amer: 60 mL/min/{1.73_m2} (ref >59)
GLUCOSE: 98 mg/dL (ref 65–99)
Potassium: 4.4 mmol/L (ref 3.5–5.2)
SODIUM: 142 mmol/L (ref 134–144)

## 2017-09-22 LAB — CBC WITH DIFFERENTIAL/PLATELET
BASOS ABS: 0.1 10*3/uL (ref 0.0–0.2)
Basos: 1 %
EOS (ABSOLUTE): 0.3 10*3/uL (ref 0.0–0.4)
Eos: 4 %
HEMOGLOBIN: 12 g/dL (ref 11.1–15.9)
Hematocrit: 37.7 % (ref 34.0–46.6)
IMMATURE GRANS (ABS): 0 10*3/uL (ref 0.0–0.1)
Immature Granulocytes: 1 %
LYMPHS: 24 %
Lymphocytes Absolute: 1.9 10*3/uL (ref 0.7–3.1)
MCH: 25.2 pg — ABNORMAL LOW (ref 26.6–33.0)
MCHC: 31.8 g/dL (ref 31.5–35.7)
MCV: 79 fL (ref 79–97)
MONOCYTES: 7 %
Monocytes Absolute: 0.6 10*3/uL (ref 0.1–0.9)
Neutrophils Absolute: 5.2 10*3/uL (ref 1.4–7.0)
Neutrophils: 63 %
PLATELETS: 303 10*3/uL (ref 150–450)
RBC: 4.77 x10E6/uL (ref 3.77–5.28)
RDW: 15.2 % (ref 12.3–15.4)
WBC: 8 10*3/uL (ref 3.4–10.8)

## 2017-09-22 NOTE — Addendum Note (Signed)
Addended by: Tarri Glenn on: 09/22/2017 11:18 AM   Modules accepted: Orders

## 2017-09-23 DIAGNOSIS — E039 Hypothyroidism, unspecified: Secondary | ICD-10-CM | POA: Diagnosis not present

## 2017-09-23 DIAGNOSIS — E1149 Type 2 diabetes mellitus with other diabetic neurological complication: Secondary | ICD-10-CM | POA: Diagnosis not present

## 2017-09-23 DIAGNOSIS — F5104 Psychophysiologic insomnia: Secondary | ICD-10-CM | POA: Diagnosis not present

## 2017-09-23 DIAGNOSIS — Z79899 Other long term (current) drug therapy: Secondary | ICD-10-CM | POA: Diagnosis not present

## 2017-09-23 DIAGNOSIS — E785 Hyperlipidemia, unspecified: Secondary | ICD-10-CM | POA: Diagnosis not present

## 2017-09-23 DIAGNOSIS — I10 Essential (primary) hypertension: Secondary | ICD-10-CM | POA: Diagnosis not present

## 2017-09-23 DIAGNOSIS — Z6841 Body Mass Index (BMI) 40.0 and over, adult: Secondary | ICD-10-CM | POA: Diagnosis not present

## 2017-09-23 DIAGNOSIS — G4733 Obstructive sleep apnea (adult) (pediatric): Secondary | ICD-10-CM | POA: Diagnosis not present

## 2017-09-23 DIAGNOSIS — E559 Vitamin D deficiency, unspecified: Secondary | ICD-10-CM | POA: Diagnosis not present

## 2017-09-24 DIAGNOSIS — D860 Sarcoidosis of lung: Secondary | ICD-10-CM | POA: Diagnosis not present

## 2017-09-24 DIAGNOSIS — R918 Other nonspecific abnormal finding of lung field: Secondary | ICD-10-CM | POA: Diagnosis not present

## 2017-09-24 DIAGNOSIS — M542 Cervicalgia: Secondary | ICD-10-CM | POA: Diagnosis not present

## 2017-09-24 DIAGNOSIS — J309 Allergic rhinitis, unspecified: Secondary | ICD-10-CM | POA: Diagnosis not present

## 2017-09-24 DIAGNOSIS — M5412 Radiculopathy, cervical region: Secondary | ICD-10-CM | POA: Diagnosis not present

## 2017-09-24 DIAGNOSIS — G4733 Obstructive sleep apnea (adult) (pediatric): Secondary | ICD-10-CM | POA: Diagnosis not present

## 2017-09-27 NOTE — Pre-Procedure Instructions (Addendum)
Melanie Meyers  09/27/2017      [facility] - [place], [place] - [place] [address] Phone: [phone] Fax: [facility] [place], [place] [address] Phone: [phone] Fax: [phone]    Your procedure is scheduled on Wednesday, October 2nd.  Report to [facility] Admitting at 6:30 A.M.  Call this number if you have problems the morning of surgery:  [phone]   Remember:  Do not eat or drink after midnight.    Take these medicines the morning of surgery with A SIP OF WATER  levothyroxine (SYNTHROID, LEVOTHROID)  methocarbamol (ROBAXIN)  pregabalin (LYRICA)   As needed- acetaminophen (TYLENOL)  albuterol (VENTOLIN HFA)-please bring with you to hospital. rizatriptan ([place])   Follow your surgeon's instructions on when to stop Asprin.  If no instructions were given by your surgeon then you will need to call the office to get those instructions.    7 days prior to surgery STOP taking any Aspirin(unless otherwise instructed by your surgeon), Aleve, Naproxen, Ibuprofen, Motrin, Advil, Goody's, BC's, all herbal medications, fish oil, and all vitamins   WHAT DO I DO ABOUT MY DIABETES MEDICATION?   Marland Kitchen Do not take oral diabetes medicines (pills) the morning of surgery. DO NOT TAKE YOUR metFORMIN (GLUCOPHAGE) THE MORNING OF SURGERY.  . THE MORNING OF SURGERY, DO NOT TAKE YOUR Insulin Glargine-Lixisenatide (SOLIQUA) insulin.   How to Manage Your Diabetes Before and After Surgery  Why is it important to control my blood sugar before and after surgery? . Improving blood sugar levels before and after surgery helps healing and can limit problems. . A way of improving blood sugar control is eating a healthy diet by: o  Eating less sugar and carbohydrates o  Increasing activity/exercise o  Talking with your doctor about reaching your blood  sugar goals . High blood sugars (greater than 180 mg/dL) can raise your risk of infections and slow your recovery, so you will need to focus on controlling your diabetes during the weeks before surgery. . Make sure that the doctor who takes care of your diabetes knows about your planned surgery including the date and location.  How do I manage my blood sugar before surgery? . Check your blood sugar at least 4 times a day, starting 2 days before surgery, to make sure that the level is not too high or low. o Check your blood sugar the morning of your surgery when you wake up and every 2 hours until you get to the Short Stay unit. . If your blood sugar is less than 70 mg/dL, you will need to treat for low blood sugar: o Do not take insulin. o Treat a low blood sugar (less than 70 mg/dL) with  cup of clear juice (cranberry or apple), 4 glucose tablets, OR glucose gel. o Recheck blood sugar in 15 minutes after treatment (to make sure it is greater than 70 mg/dL). If your blood sugar is not greater than 70 mg/dL on recheck, call [phone] for further instructions. . Report your blood sugar to the short stay nurse when you get to Short Stay.  . If you are admitted to the hospital after surgery: o Your blood sugar will be checked by the staff and you will probably be given insulin after surgery (instead of oral diabetes medicines) to make sure you have good blood sugar levels. o The  goal for blood sugar control after surgery is 80-180 mg/dL.    Do not wear jewelry, make-up or nail polish.  Do not wear lotions, powders, or perfumes, or deodorant.  Do not shave 48 hours prior to surgery.    Do not bring valuables to the hospital.  [facility] is not responsible for any belongings or valuables.  Contacts, dentures or bridgework may not be worn into surgery.  Leave your suitcase in the car.  After surgery it may be brought to your room.  For patients admitted to the hospital, discharge time will be  determined by your treatment team.  Patients discharged the day of surgery will not be allowed to drive home.   Special instructions:   - Preparing For Surgery  Before surgery, you can play an important role. Because skin is not sterile, your skin needs to be as free of germs as possible. You can reduce the number of germs on your skin by washing with CHG (chlorahexidine gluconate) Soap before surgery.  CHG is an antiseptic cleaner which kills germs and bonds with the skin to continue killing germs even after washing.    Oral Hygiene is also important to reduce your risk of infection.  Remember - BRUSH YOUR TEETH THE MORNING OF SURGERY WITH YOUR REGULAR TOOTHPASTE  Please do not use if you have an allergy to CHG or antibacterial soaps. If your skin becomes reddened/irritated stop using the CHG.  Do not shave (including legs and underarms) for at least 48 hours prior to first CHG shower. It is OK to shave your face.  Please follow these instructions carefully.   1. Shower the NIGHT BEFORE SURGERY and the MORNING OF SURGERY with CHG.   2. If you chose to wash your hair, wash your hair first as usual with your normal shampoo.  3. After you shampoo, rinse your hair and body thoroughly to remove the shampoo.  4. Use CHG as you would any other liquid soap. You can apply CHG directly to the skin and wash gently with a scrungie or a clean washcloth.   5. Apply the CHG Soap to your body ONLY FROM THE NECK DOWN.  Do not use on open wounds or open sores. Avoid contact with your eyes, ears, mouth and genitals (private parts). Wash Face and genitals (private parts)  with your normal soap.  6. Wash thoroughly, paying special attention to the area where your surgery will be performed.  7. Thoroughly rinse your body with warm water from the neck down.  8. DO NOT shower/wash with your normal soap after using and rinsing off the CHG Soap.  9. Pat yourself dry with a CLEAN TOWEL.  10. Wear  CLEAN PAJAMAS to bed the night before surgery, wear comfortable clothes the morning of surgery  11. Place CLEAN SHEETS on your bed the night of your first shower and DO NOT SLEEP WITH PETS.    Day of Surgery:  Do not apply any deodorants/lotions.  Please wear clean clothes to the hospital/surgery center.   Remember to brush your teeth WITH YOUR REGULAR TOOTHPASTE.   Please read over the following fact sheets that you were given.

## 2017-09-28 ENCOUNTER — Other Ambulatory Visit: Payer: Self-pay

## 2017-09-28 ENCOUNTER — Encounter (HOSPITAL_COMMUNITY): Payer: Self-pay

## 2017-09-28 ENCOUNTER — Telehealth: Payer: Self-pay | Admitting: *Deleted

## 2017-09-28 ENCOUNTER — Encounter (HOSPITAL_COMMUNITY)
Admission: RE | Admit: 2017-09-28 | Discharge: 2017-09-28 | Disposition: A | Discharge status: Home / Self Care | Payer: Medicare HMO | Source: Ambulatory Visit | Attending: Orthopedic Surgery | Admitting: Orthopedic Surgery

## 2017-09-28 DIAGNOSIS — G5601 Carpal tunnel syndrome, right upper limb: Secondary | ICD-10-CM | POA: Diagnosis not present

## 2017-09-28 DIAGNOSIS — F329 Major depressive disorder, single episode, unspecified: Secondary | ICD-10-CM

## 2017-09-28 DIAGNOSIS — I10 Essential (primary) hypertension: Secondary | ICD-10-CM

## 2017-09-28 DIAGNOSIS — E1140 Type 2 diabetes mellitus with diabetic neuropathy, unspecified: Secondary | ICD-10-CM | POA: Insufficient documentation

## 2017-09-28 DIAGNOSIS — Z885 Allergy status to narcotic agent status: Secondary | ICD-10-CM | POA: Diagnosis not present

## 2017-09-28 DIAGNOSIS — D869 Sarcoidosis, unspecified: Secondary | ICD-10-CM | POA: Insufficient documentation

## 2017-09-28 DIAGNOSIS — Z881 Allergy status to other antibiotic agents status: Secondary | ICD-10-CM | POA: Diagnosis not present

## 2017-09-28 DIAGNOSIS — Z794 Long term (current) use of insulin: Secondary | ICD-10-CM | POA: Diagnosis not present

## 2017-09-28 DIAGNOSIS — G43909 Migraine, unspecified, not intractable, without status migrainosus: Secondary | ICD-10-CM | POA: Diagnosis not present

## 2017-09-28 DIAGNOSIS — E039 Hypothyroidism, unspecified: Secondary | ICD-10-CM

## 2017-09-28 DIAGNOSIS — K219 Gastro-esophageal reflux disease without esophagitis: Secondary | ICD-10-CM | POA: Diagnosis not present

## 2017-09-28 DIAGNOSIS — D860 Sarcoidosis of lung: Secondary | ICD-10-CM | POA: Diagnosis not present

## 2017-09-28 DIAGNOSIS — Z79899 Other long term (current) drug therapy: Secondary | ICD-10-CM | POA: Diagnosis not present

## 2017-09-28 DIAGNOSIS — Z7982 Long term (current) use of aspirin: Secondary | ICD-10-CM | POA: Diagnosis not present

## 2017-09-28 DIAGNOSIS — M797 Fibromyalgia: Secondary | ICD-10-CM | POA: Diagnosis not present

## 2017-09-28 DIAGNOSIS — Z01812 Encounter for preprocedural laboratory examination: Secondary | ICD-10-CM | POA: Insufficient documentation

## 2017-09-28 DIAGNOSIS — E538 Deficiency of other specified B group vitamins: Secondary | ICD-10-CM

## 2017-09-28 DIAGNOSIS — E559 Vitamin D deficiency, unspecified: Secondary | ICD-10-CM | POA: Insufficient documentation

## 2017-09-28 DIAGNOSIS — J45909 Unspecified asthma, uncomplicated: Secondary | ICD-10-CM | POA: Diagnosis not present

## 2017-09-28 DIAGNOSIS — G4733 Obstructive sleep apnea (adult) (pediatric): Secondary | ICD-10-CM | POA: Diagnosis not present

## 2017-09-28 DIAGNOSIS — I25119 Atherosclerotic heart disease of native coronary artery with unspecified angina pectoris: Secondary | ICD-10-CM | POA: Diagnosis not present

## 2017-09-28 DIAGNOSIS — Z888 Allergy status to other drugs, medicaments and biological substances status: Secondary | ICD-10-CM | POA: Diagnosis not present

## 2017-09-28 DIAGNOSIS — Z79890 Hormone replacement therapy (postmenopausal): Secondary | ICD-10-CM | POA: Diagnosis not present

## 2017-09-28 DIAGNOSIS — E782 Mixed hyperlipidemia: Secondary | ICD-10-CM | POA: Diagnosis not present

## 2017-09-28 DIAGNOSIS — G4700 Insomnia, unspecified: Secondary | ICD-10-CM | POA: Diagnosis not present

## 2017-09-28 DIAGNOSIS — I209 Angina pectoris, unspecified: Secondary | ICD-10-CM | POA: Diagnosis present

## 2017-09-28 DIAGNOSIS — Z7951 Long term (current) use of inhaled steroids: Secondary | ICD-10-CM | POA: Diagnosis not present

## 2017-09-28 LAB — SURGICAL PCR SCREEN
MRSA, PCR: NEGATIVE
STAPHYLOCOCCUS AUREUS: NEGATIVE

## 2017-09-28 LAB — BASIC METABOLIC PANEL
Anion gap: 11 (ref 5–15)
BUN: 22 mg/dL (ref 8–23)
CALCIUM: 9.6 mg/dL (ref 8.9–10.3)
CO2: 24 mmol/L (ref 22–32)
CREATININE: 0.97 mg/dL (ref 0.44–1.00)
Chloride: 104 mmol/L (ref 98–111)
GFR calc non Af Amer: >60 mL/min (ref >60)
GLUCOSE: 91 mg/dL (ref 70–99)
Potassium: 4.4 mmol/L (ref 3.5–5.1)
Sodium: 139 mmol/L (ref 135–145)

## 2017-09-28 LAB — CBC
HCT: 40.7 % (ref 36.0–46.0)
Hemoglobin: 12.5 g/dL (ref 12.0–15.0)
MCH: 25.4 pg — AB (ref 26.0–34.0)
MCHC: 30.7 g/dL (ref 30.0–36.0)
MCV: 82.7 fL (ref 78.0–100.0)
PLATELETS: 280 10*3/uL (ref 150–400)
RBC: 4.92 MIL/uL (ref 3.87–5.11)
RDW: 16.6 % — ABNORMAL HIGH (ref 11.5–15.5)
WBC: 9.5 10*3/uL (ref 4.0–10.5)

## 2017-09-28 LAB — GLUCOSE, CAPILLARY: Glucose-Capillary: 95 mg/dL (ref 70–99)

## 2017-09-28 LAB — HEMOGLOBIN A1C
Hgb A1c MFr Bld: 6.3 % — ABNORMAL HIGH (ref 4.8–5.6)
Mean Plasma Glucose: 134.11 mg/dL

## 2017-09-28 NOTE — Progress Notes (Signed)
PCP - Janine Limbo PA-C Cardiologist - Rajan Revankar   Chest x-ray - 09/21/17 EKG - 09/21/17 Stress Test - 10+ years ago ECHO - denies Cardiac Cath - planned for 09/29/17  Sleep Study - positive for sleep apnea CPAP - uses nightly, setting is 8.   Fasting Blood Sugar - 90 CBG at PAT appointment 95 Checks Blood Sugar 2-3 times a day  Aspirin Instructions: Will f/u with PA-C on Monday. Pt continuing ASA at this time for cardiac cath tomorrow.   Anesthesia review: Yes, per Dr. Rolena Infante.   Patient denies shortness of breath, fever, cough and chest pain at PAT appointment   Patient verbalized understanding of instructions that were given to them at the PAT appointment. Patient was also instructed that they will need to review over the PAT instructions again at home before surgery.

## 2017-09-28 NOTE — Telephone Encounter (Signed)
Pt contacted pre-catheterization scheduled at [facility] for: Wednesday September 29, 2017 8:30 AM Verified arrival time and place: [place] Entrance A at: 6:30 AM  No solid food after midnight prior to cath, clear liquids until 5 AM day of procedure. Verify allergies in Epic  Hold: Insulin-AM of procedure Metformin-day of procedure and 48 hours post procedure.  Except hold medications AM meds can be  taken pre-cath with sip of water including: ASA 81 mg  Confirm patient has responsible person to drive home post procedure and for 24 hours after you arrive home.

## 2017-09-28 NOTE — Telephone Encounter (Signed)
Discussed instructions with patient, she verbalized understanding, thanked me for call. 

## 2017-09-28 NOTE — Progress Notes (Signed)
Verified with diabetic coordinator that pt is to hold her insulin glargine-lixisenati Willeen Niece) the morning of surgery.

## 2017-09-29 ENCOUNTER — Encounter (HOSPITAL_COMMUNITY)
Admission: RE | Disposition: A | Discharge status: Home / Self Care | Payer: Self-pay | Source: Ambulatory Visit | Attending: Cardiology

## 2017-09-29 ENCOUNTER — Encounter (HOSPITAL_COMMUNITY): Payer: Self-pay | Admitting: Cardiology

## 2017-09-29 ENCOUNTER — Ambulatory Visit (HOSPITAL_COMMUNITY)
Admission: RE | Admit: 2017-09-29 | Discharge: 2017-09-29 | Disposition: A | Discharge status: Home / Self Care | Payer: Medicare HMO | Source: Ambulatory Visit | Attending: Cardiology | Admitting: Cardiology

## 2017-09-29 DIAGNOSIS — R079 Chest pain, unspecified: Secondary | ICD-10-CM

## 2017-09-29 DIAGNOSIS — G5601 Carpal tunnel syndrome, right upper limb: Secondary | ICD-10-CM | POA: Insufficient documentation

## 2017-09-29 DIAGNOSIS — Z794 Long term (current) use of insulin: Secondary | ICD-10-CM | POA: Insufficient documentation

## 2017-09-29 DIAGNOSIS — D869 Sarcoidosis, unspecified: Secondary | ICD-10-CM | POA: Diagnosis not present

## 2017-09-29 DIAGNOSIS — Z9889 Other specified postprocedural states: Secondary | ICD-10-CM | POA: Insufficient documentation

## 2017-09-29 DIAGNOSIS — E1140 Type 2 diabetes mellitus with diabetic neuropathy, unspecified: Secondary | ICD-10-CM | POA: Diagnosis not present

## 2017-09-29 DIAGNOSIS — Z885 Allergy status to narcotic agent status: Secondary | ICD-10-CM | POA: Insufficient documentation

## 2017-09-29 DIAGNOSIS — Z833 Family history of diabetes mellitus: Secondary | ICD-10-CM | POA: Insufficient documentation

## 2017-09-29 DIAGNOSIS — G4733 Obstructive sleep apnea (adult) (pediatric): Secondary | ICD-10-CM | POA: Insufficient documentation

## 2017-09-29 DIAGNOSIS — E088 Diabetes mellitus due to underlying condition with unspecified complications: Secondary | ICD-10-CM

## 2017-09-29 DIAGNOSIS — F329 Major depressive disorder, single episode, unspecified: Secondary | ICD-10-CM | POA: Insufficient documentation

## 2017-09-29 DIAGNOSIS — E559 Vitamin D deficiency, unspecified: Secondary | ICD-10-CM | POA: Diagnosis not present

## 2017-09-29 DIAGNOSIS — Z8249 Family history of ischemic heart disease and other diseases of the circulatory system: Secondary | ICD-10-CM | POA: Insufficient documentation

## 2017-09-29 DIAGNOSIS — E039 Hypothyroidism, unspecified: Secondary | ICD-10-CM | POA: Insufficient documentation

## 2017-09-29 DIAGNOSIS — I25119 Atherosclerotic heart disease of native coronary artery with unspecified angina pectoris: Secondary | ICD-10-CM | POA: Diagnosis not present

## 2017-09-29 DIAGNOSIS — K219 Gastro-esophageal reflux disease without esophagitis: Secondary | ICD-10-CM | POA: Insufficient documentation

## 2017-09-29 DIAGNOSIS — E785 Hyperlipidemia, unspecified: Secondary | ICD-10-CM | POA: Diagnosis present

## 2017-09-29 DIAGNOSIS — I10 Essential (primary) hypertension: Secondary | ICD-10-CM | POA: Diagnosis not present

## 2017-09-29 DIAGNOSIS — Z7951 Long term (current) use of inhaled steroids: Secondary | ICD-10-CM | POA: Insufficient documentation

## 2017-09-29 DIAGNOSIS — Z9851 Tubal ligation status: Secondary | ICD-10-CM | POA: Insufficient documentation

## 2017-09-29 DIAGNOSIS — Z79890 Hormone replacement therapy (postmenopausal): Secondary | ICD-10-CM | POA: Insufficient documentation

## 2017-09-29 DIAGNOSIS — G4700 Insomnia, unspecified: Secondary | ICD-10-CM | POA: Insufficient documentation

## 2017-09-29 DIAGNOSIS — Z888 Allergy status to other drugs, medicaments and biological substances status: Secondary | ICD-10-CM | POA: Insufficient documentation

## 2017-09-29 DIAGNOSIS — E119 Type 2 diabetes mellitus without complications: Secondary | ICD-10-CM

## 2017-09-29 DIAGNOSIS — Z6841 Body Mass Index (BMI) 40.0 and over, adult: Secondary | ICD-10-CM | POA: Insufficient documentation

## 2017-09-29 DIAGNOSIS — Z7982 Long term (current) use of aspirin: Secondary | ICD-10-CM | POA: Insufficient documentation

## 2017-09-29 DIAGNOSIS — Z881 Allergy status to other antibiotic agents status: Secondary | ICD-10-CM | POA: Insufficient documentation

## 2017-09-29 DIAGNOSIS — G43909 Migraine, unspecified, not intractable, without status migrainosus: Secondary | ICD-10-CM | POA: Insufficient documentation

## 2017-09-29 DIAGNOSIS — Z79899 Other long term (current) drug therapy: Secondary | ICD-10-CM | POA: Insufficient documentation

## 2017-09-29 DIAGNOSIS — Z8349 Family history of other endocrine, nutritional and metabolic diseases: Secondary | ICD-10-CM | POA: Insufficient documentation

## 2017-09-29 DIAGNOSIS — E782 Mixed hyperlipidemia: Secondary | ICD-10-CM | POA: Insufficient documentation

## 2017-09-29 DIAGNOSIS — M797 Fibromyalgia: Secondary | ICD-10-CM | POA: Insufficient documentation

## 2017-09-29 DIAGNOSIS — I209 Angina pectoris, unspecified: Secondary | ICD-10-CM | POA: Diagnosis present

## 2017-09-29 DIAGNOSIS — Z01810 Encounter for preprocedural cardiovascular examination: Secondary | ICD-10-CM

## 2017-09-29 DIAGNOSIS — J45909 Unspecified asthma, uncomplicated: Secondary | ICD-10-CM | POA: Insufficient documentation

## 2017-09-29 HISTORY — PX: LEFT HEART CATH AND CORONARY ANGIOGRAPHY: CATH118249

## 2017-09-29 LAB — GLUCOSE, CAPILLARY
GLUCOSE-CAPILLARY: 85 mg/dL (ref 70–99)
Glucose-Capillary: 182 mg/dL — ABNORMAL HIGH (ref 70–99)

## 2017-09-29 SURGERY — LEFT HEART CATH AND CORONARY ANGIOGRAPHY
Anesthesia: LOCAL

## 2017-09-29 MED ORDER — MIDAZOLAM HCL 2 MG/2ML IJ SOLN
INTRAMUSCULAR | Status: DC | PRN
  Administered 2017-09-29: 2 mg via INTRAVENOUS

## 2017-09-29 MED ORDER — FENTANYL CITRATE (PF) 100 MCG/2ML IJ SOLN
INTRAMUSCULAR | Status: DC | PRN
  Administered 2017-09-29: 25 ug via INTRAVENOUS

## 2017-09-29 MED ORDER — VERAPAMIL HCL 2.5 MG/ML IV SOLN
INTRAVENOUS | Status: AC
  Filled 2017-09-29: qty 2

## 2017-09-29 MED ORDER — SODIUM CHLORIDE 0.9 % IV SOLN: 250.0000 mL | INTRAVENOUS | Status: DC | PRN

## 2017-09-29 MED ORDER — SODIUM CHLORIDE 0.9% FLUSH: 3.0000 mL | Freq: Two times a day (BID) | INTRAVENOUS | Status: DC

## 2017-09-29 MED ORDER — SODIUM CHLORIDE 0.9% FLUSH: 3.0000 mL | INTRAVENOUS | Status: DC | PRN

## 2017-09-29 MED ORDER — SODIUM CHLORIDE 0.9 % WEIGHT BASED INFUSION: 1.0000 mL/kg/h | INTRAVENOUS | Status: DC

## 2017-09-29 MED ORDER — FENTANYL CITRATE (PF) 100 MCG/2ML IJ SOLN
INTRAMUSCULAR | Status: AC
  Filled 2017-09-29: qty 2

## 2017-09-29 MED ORDER — ACETAMINOPHEN 325 MG PO TABS: 650.0000 mg | ORAL_TABLET | ORAL | Status: DC | PRN

## 2017-09-29 MED ORDER — LIDOCAINE HCL (PF) 1 % IJ SOLN
INTRAMUSCULAR | Status: AC
  Filled 2017-09-29: qty 30

## 2017-09-29 MED ORDER — METFORMIN HCL 1000 MG PO TABS: 1000.0000 mg | ORAL_TABLET | Freq: Two times a day (BID) | ORAL | Status: DC

## 2017-09-29 MED ORDER — HEPARIN (PORCINE) IN NACL 1000-0.9 UT/500ML-% IV SOLN
INTRAVENOUS | Status: AC
  Filled 2017-09-29: qty 500

## 2017-09-29 MED ORDER — ONDANSETRON HCL 4 MG/2ML IJ SOLN: 4.0000 mg | Freq: Four times a day (QID) | INTRAMUSCULAR | Status: DC | PRN

## 2017-09-29 MED ORDER — HEPARIN (PORCINE) IN NACL 1000-0.9 UT/500ML-% IV SOLN
INTRAVENOUS | Status: DC | PRN
  Administered 2017-09-29: 500 mL

## 2017-09-29 MED ORDER — IOHEXOL 350 MG/ML SOLN
INTRAVENOUS | Status: DC | PRN
  Administered 2017-09-29: 85 mL via INTRA_ARTERIAL

## 2017-09-29 MED ORDER — MIDAZOLAM HCL 2 MG/2ML IJ SOLN
INTRAMUSCULAR | Status: AC
  Filled 2017-09-29: qty 2

## 2017-09-29 MED ORDER — ASPIRIN 81 MG PO CHEW: 81.0000 mg | CHEWABLE_TABLET | ORAL | Status: DC

## 2017-09-29 MED ORDER — LIDOCAINE HCL (PF) 1 % IJ SOLN
INTRAMUSCULAR | Status: DC | PRN
  Administered 2017-09-29: 2 mL

## 2017-09-29 MED ORDER — SODIUM CHLORIDE 0.9 % WEIGHT BASED INFUSION
3.0000 mL/kg/h | INTRAVENOUS | Status: AC
  Administered 2017-09-29: 3 mL/kg/h via INTRAVENOUS

## 2017-09-29 MED ORDER — HEPARIN SODIUM (PORCINE) 1000 UNIT/ML IJ SOLN
INTRAMUSCULAR | Status: DC | PRN
  Administered 2017-09-29: 5000 [IU] via INTRAVENOUS

## 2017-09-29 MED ORDER — VERAPAMIL HCL 2.5 MG/ML IV SOLN
INTRAVENOUS | Status: DC | PRN
  Administered 2017-09-29: 09:00:00 via INTRA_ARTERIAL

## 2017-09-29 SURGICAL SUPPLY — 11 items
CATH 5FR JL3.5 JR4 ANG PIG MP (CATHETERS) ×2 IMPLANT
DEVICE RAD COMP TR BAND LRG (VASCULAR PRODUCTS) ×2 IMPLANT
GLIDESHEATH SLEND SS 6F .021 (SHEATH) ×2 IMPLANT
GUIDEWIRE INQWIRE 1.5J.035X260 (WIRE) ×1 IMPLANT
INQWIRE 1.5J .035X260CM (WIRE) ×2
KIT HEART LEFT (KITS) ×2 IMPLANT
PACK CARDIAC CATHETERIZATION (CUSTOM PROCEDURE TRAY) ×2 IMPLANT
SYR MEDRAD MARK V 150ML (SYRINGE) ×2 IMPLANT
TRANSDUCER W/STOPCOCK (MISCELLANEOUS) ×2 IMPLANT
TUBING CIL FLEX 10 FLL-RA (TUBING) ×2 IMPLANT
WIRE HI TORQ VERSACORE-J 145CM (WIRE) ×2 IMPLANT

## 2017-09-29 NOTE — Interval H&P Note (Signed)
History and Physical Interval Note:  09/29/2017 8:19 AM  Melanie Meyers  has presented today for surgery, with the diagnosis of angina  The various methods of treatment have been discussed with the patient and family. After consideration of risks, benefits and other options for treatment, the patient has consented to  Procedure(s): LEFT HEART CATH AND CORONARY ANGIOGRAPHY (N/A) as a surgical intervention .  The patient's history has been reviewed, patient examined, no change in status, stable for surgery.  I have reviewed the patient's chart and labs.  Questions were answered to the patient's satisfaction.   Cath Lab Visit (complete for each Cath Lab visit)  Clinical Evaluation Leading to the Procedure:   ACS: No.  Non-ACS:    Anginal Classification: CCS II  Anti-ischemic medical therapy: No Therapy  Non-Invasive Test Results: No non-invasive testing performed  Prior CABG: No previous CABG        [name] [facility] 09/29/2017 8:19 AM

## 2017-09-29 NOTE — Research (Signed)
CADFEM Informed Consent   Subject Name: Melanie Meyers  Subject met inclusion and exclusion criteria.  The informed consent form, study requirements and expectations were reviewed with the subject and questions and concerns were addressed prior to the signing of the consent form.  The subject verbalized understanding of the trail requirements.  The subject agreed to participate in the CADFEM trial and signed the informed consent.  The informed consent was obtained prior to performance of any protocol-specific procedures for the subject.  A copy of the signed informed consent was given to the subject and a copy was placed in the subject's medical record.  Hedrick,Tammy W 09/29/2017, 0715  

## 2017-09-29 NOTE — Discharge Instructions (Signed)

## 2017-09-29 NOTE — Progress Notes (Signed)
Anesthesia Chart Review:  Case:  [id number] Date/Time:  10/06/17 0815   Procedure:  ANTERIOR CERVICAL DECOMPRESSION/DISCECTOMY FUSION C4-7 (N/A ) - 4.5 hrs   Anesthesia type:  General   Pre-op diagnosis:  Cervical spondylotic radiculopathy   Location:  [facility] OR ROOM 04 / [facility] OR   Surgeon:  Melina Schools, MD      DISCUSSION: 62 yo female never smoker. Pertinent hx includes DMII, Hypothyroid, Sarcoidosis, Asthma, Depression, OSA on CPAP, Migraines, HTN, GERD, Fibromyalgia, Neuropathy.  Pt seen by Dr. Geraldo Pitter for preop clearance 09/21/2017. At that appt it was noted she was having concerning exertional chest pain and cath was ordered to rule out ischemia. Cath performed 09/29/17 showed nonobstructive CAD, normal LV function, normal LVEDP. Recommended medical therapy and risk factor modification.  Cardiac clearance by Dr. Geraldo Pitter 09/30/2017 states pt is low to mod risk.  Anticipate she can proceed with surgery as planned barring acute status change.  VS: BP 129/65   Pulse 68   Temp 36.9 C   Resp 20   Ht 5' (1.524 m)   Wt 94.9 kg   LMP  (LMP Unknown)   SpO2 100%   BMI 40.86 kg/m   PROVIDERS: O'Buch, Greta, PA-C is PCP  Revankar, Rajan,MD is Cardiologist  LABS: Labs reviewed: Acceptable for surgery. (all labs ordered are listed, but only abnormal results are displayed)  Labs Reviewed  HEMOGLOBIN A1C - Abnormal; Notable for the following components:      Result Value   Hgb A1c MFr Bld 6.3 (*)    All other components within normal limits  CBC - Abnormal; Notable for the following components:   MCH 25.4 (*)    RDW 16.6 (*)    All other components within normal limits  SURGICAL PCR SCREEN  GLUCOSE, CAPILLARY  BASIC METABOLIC PANEL     IMAGES: Chest 2 view 09/21/2017: Impression: Stable bilateral pulmonary nodules are noted consistent with history of sarcoidosis.  No acute cardiopulmonary abnormality seen.  EKG: 09/21/2017: NSR 75bpm   CV: Cath 09/29/2017:  Prox RCA lesion  is 30% stenosed.  Prox LAD lesion is 35% stenosed.  Ost Ramus to Ramus lesion is 50% stenosed.  The left ventricular systolic function is normal.  LV end diastolic pressure is normal.  The left ventricular ejection fraction is 55-65% by visual estimate.   1. Nonobstructive CAD 2. Normal LV function 3. Normal LVEDP  Plan: recommend medical therapy and risk factor modification.  Recommend Aspirin 81mg  daily for moderate CAD.  Past Medical History:  Diagnosis Date  . Asthma   . B12 deficiency   . Back pain   . Carpal tunnel syndrome of right wrist   . Degenerative arthritis    degenerative arthritis of neck  . Depression   . Diabetes mellitus without complication ([place])   . Fibromyalgia   . GERD (gastroesophageal reflux disease)   . High cholesterol   . Hypertension   . Hypothyroid   . Insomnia   . Migraine   . Migraines   . Neuropathy   . Neuropathy   . OSA on CPAP   . Sarcoidosis   . Sleep apnea   . Thyroid disease   . Vitamin D deficiency     Past Surgical History:  Procedure Laterality Date  . BREAST REDUCTION SURGERY  02/18/2015  . CARPAL TUNNEL RELEASE  2014  . CARPAL TUNNEL RELEASE  04/2016  . [place]  . LEFT HEART CATH AND CORONARY ANGIOGRAPHY N/A 09/29/2017  Procedure: LEFT HEART CATH AND CORONARY ANGIOGRAPHY;  Surgeon: Martinique, Peter M, MD;  Location: [facility];  Service: Cardiovascular;  Laterality: N/A;  . NASAL POLYP SURGERY  1983  . SINOSCOPY  10/09/2014  . TONSILLECTOMY  1978  . TUBAL LIGATION      MEDICATIONS: . acetaminophen (TYLENOL) 500 MG tablet  . albuterol (VENTOLIN HFA) 108 (90 Base) MCG/ACT inhaler  . aspirin EC 81 MG tablet  . Biotin 10000 MCG TABS  . budesonide-formoterol (SYMBICORT) 160-4.5 MCG/ACT inhaler  . cetirizine (ZYRTEC) 10 MG tablet  . Cholecalciferol (VITAMIN D3) 5000 units CAPS  . co-enzyme Q-10 30 MG capsule  . Insulin Glargine-Lixisenatide (SOLIQUA) 100-33 UNT-MCG/ML SOPN  .  levothyroxine (SYNTHROID, LEVOTHROID) 25 MCG tablet  . loratadine (CLARITIN) 10 MG tablet  . losartan (COZAAR) 25 MG tablet  . Magnesium 250 MG TABS  . meloxicam (MOBIC) 15 MG tablet  . Menthol, Topical Analgesic, (BIOFREEZE EX)  . methocarbamol (ROBAXIN) 500 MG tablet  . mometasone (NASONEX) 50 MCG/ACT nasal spray  . montelukast (SINGULAIR) 10 MG tablet  . Multiple Vitamin (MULTIVITAMIN WITH MINERALS) TABS tablet  . Multiple Vitamins-Minerals (ANTIOXIDANT) CAPS  . nitroGLYCERIN (NITROSTAT) 0.4 MG SL tablet  . Omega-3 Fatty Acids (FISH OIL) 1000 MG CAPS  . polyethylene glycol (MIRALAX / GLYCOLAX) packet  . potassium gluconate (HM POTASSIUM) 595 (99 K) MG TABS tablet  . pravastatin (PRAVACHOL) 40 MG tablet  . pregabalin (LYRICA) 150 MG capsule  . ranitidine (ZANTAC) 300 MG tablet  . rizatriptan (MAXALT) 10 MG tablet  . sertraline (ZOLOFT) 50 MG tablet  . zolpidem (AMBIEN) 10 MG tablet  . ACCU-CHEK AVIVA PLUS test strip  . amoxicillin (AMOXIL) 500 MG capsule  . [START ON 10/02/2017] metFORMIN (GLUCOPHAGE) 1000 MG tablet  . UNIFINE PENTIPS 31G X 6 MM MISC   . triamcinolone acetonide (KENALOG) 10 MG/ML injection 10 mg  . triamcinolone acetonide (KENALOG) 10 MG/ML injection 10 mg     Wynonia Musty [facility] Short Stay Center/Anesthesiology Phone [phone] 10/01/2017 10:54 AM

## 2017-10-01 DIAGNOSIS — D860 Sarcoidosis of lung: Secondary | ICD-10-CM | POA: Diagnosis not present

## 2017-10-01 DIAGNOSIS — M549 Dorsalgia, unspecified: Secondary | ICD-10-CM | POA: Diagnosis not present

## 2017-10-01 DIAGNOSIS — G5600 Carpal tunnel syndrome, unspecified upper limb: Secondary | ICD-10-CM | POA: Diagnosis not present

## 2017-10-01 DIAGNOSIS — E1149 Type 2 diabetes mellitus with other diabetic neurological complication: Secondary | ICD-10-CM | POA: Diagnosis not present

## 2017-10-01 DIAGNOSIS — G629 Polyneuropathy, unspecified: Secondary | ICD-10-CM | POA: Diagnosis not present

## 2017-10-04 DIAGNOSIS — M5412 Radiculopathy, cervical region: Secondary | ICD-10-CM | POA: Diagnosis not present

## 2017-10-04 NOTE — H&P (Addendum)
Patient ID: Melanie Meyers MRN: [id number] DOB/AGE: May 05, 1955 62 y.o.  Admit date: (Not on file)  Admission Diagnoses:  Cervical degenerative disc disease  HPI: Very pleasant 62 year old female patient presents to the clinic for history and physical prior to her anterior cervical discectomy and fusion from C4-C7.  Pt recent heart cath and was cleared for surgery. Pt has a past med hx of sarcoidosis and asthma.  She has been cleared for surgery. Pt has DM2 last A1c was 6.1.  Pt has sleep apnea and utilizes a CPAP.  Past Medical History: Past Medical History:  Diagnosis Date  . Asthma   . B12 deficiency   . Back pain   . Carpal tunnel syndrome of right wrist   . Degenerative arthritis    degenerative arthritis of neck  . Depression   . Diabetes mellitus without complication ([place])   . Fibromyalgia   . GERD (gastroesophageal reflux disease)   . High cholesterol   . Hypertension   . Hypothyroid   . Insomnia   . Migraine   . Migraines   . Neuropathy   . Neuropathy   . OSA on CPAP   . Sarcoidosis   . Sleep apnea   . Thyroid disease   . Vitamin D deficiency     Surgical History: Past Surgical History:  Procedure Laterality Date  . BREAST REDUCTION SURGERY  02/18/2015  . CARPAL TUNNEL RELEASE  2014  . CARPAL TUNNEL RELEASE  04/2016  . [place]  . LEFT HEART CATH AND CORONARY ANGIOGRAPHY N/A 09/29/2017   Procedure: LEFT HEART CATH AND CORONARY ANGIOGRAPHY;  Surgeon: Martinique, Peter M, MD;  Location: [facility];  Service: Cardiovascular;  Laterality: N/A;  . NASAL POLYP SURGERY  1983  . SINOSCOPY  10/09/2014  . TONSILLECTOMY  1978  . TUBAL LIGATION      Family History: Family History  Problem Relation Age of Onset  . Parkinson's disease Mother   . Diabetes Mother   . Alzheimer's disease Mother   . Gout Mother   . High blood pressure Father   . High Cholesterol Father   . Heart attack Father   . Thyroid disease Sister     . Heart attack Sister   . Thyroid disease Sister   . Thyroid disease Sister     Social History: Social History   Socioeconomic History  . Marital status: Widowed    Spouse name: Not on file  . Number of children: Not on file  . Years of education: Not on file  . Highest education level: Not on file  Occupational History  . Not on file  Social Needs  . Financial resource strain: Not on file  . Food insecurity:    Worry: Not on file    Inability: Not on file  . Transportation needs:    Medical: Not on file    Non-medical: Not on file  Tobacco Use  . Smoking status: Never Smoker  . Smokeless tobacco: Never Used  Substance and Sexual Activity  . Alcohol use: No    Alcohol/week: 0.0 standard drinks  . Drug use: No  . Sexual activity: Not on file  Lifestyle  . Physical activity:    Days per week: Not on file    Minutes per session: Not on file  . Stress: Not on file  Relationships  . Social connections:    Talks on phone: Not on file    Gets together: Not  on file    Attends religious service: Not on file    Active member of club or organization: Not on file    Attends meetings of clubs or organizations: Not on file    Relationship status: Not on file  . Intimate partner violence:    Fear of current or ex partner: Not on file    Emotionally abused: Not on file    Physically abused: Not on file    Forced sexual activity: Not on file  Other Topics Concern  . Not on file  Social History Narrative  . Not on file    Allergies: Accupril [quinapril hcl]; Neurontin [gabapentin]; Prednisone; Topamax [topiramate]; and Levaquin [levofloxacin]  Medications: I have reviewed the patient's current medications.  Vital Signs: No data found.  Radiology: No results found.  Labs: No results for input(s): WBC, RBC, HCT, PLT in the last 72 hours. No results for input(s): NA, K, CL, CO2, BUN, CREATININE, GLUCOSE, CALCIUM in the last 72 hours. No results for input(s): LABPT,  INR in the last 72 hours.  Review of Systems: ROS  Physical Exam: There is no height or weight on file to calculate BMI.  Physical Exam  Constitutional: She is oriented to person, place, and time. She appears well-developed and well-nourished.  Cardiovascular: Normal rate and regular rhythm.  Respiratory: Effort normal and breath sounds normal.  GI: Soft. Bowel sounds are normal.  Neurological: She is alert and oriented to person, place, and time.  Skin: Skin is warm and dry.  Psychiatric: She has a normal mood and affect. Her behavior is normal. Judgment and thought content normal.   Neck: Significant neck pain radiating into the interscapular region.  Patient also noting significant neuropathic right arm pain.  Positive occipital headaches. Neuro: 5 out of 5 motor strength in the upper extremity.  Positive Lhermitte sign in the right upper extremity.  Positive dysesthesias in the right upper extremity.  Symmetrical 1+ deep tendon reflexes throughout the upper extremity. Reflexes: Hoffman: Negative Babinski: Negative Gait: Normal Musculoskeletal: No significant pain with range of motion of the shoulder, elbow, wrist.  Patient cervical MRI that was performed June 07, 2017.  Was significant for degenerative spondylosis at C4-5 C5-6 and C6-7 canal narrowing of the subarachnoid space but no frank cord compression foraminal narrowing could up could also be causing neural compression bilaterally at C4-5 C5-6 and C6-7  Assessment and Plan: Risks and benefits of surgery were discussed with the patient. These include: Infection, bleeding, death, stroke, paralysis, ongoing or worse pain, need for additional surgery, nonunion, leak of spinal fluid, adjacent segment degeneration requiring additional fusion surgery. Pseudoarthrosis (nonunion)requiring supplemental posterior fixation. Throat pain, swallowing difficulties, hoarseness or change in voice.  With respect to disc replacement: Additional risks  include heterotopic ossification, inability to place the disc due to technical issues requiring bailout to a fusion procedure.  Ronette Deter, PAC for Melanie Schools, MD [facility] [phone]  There is been no change in the patient's clinical exam from her last office visit of 10/04/2017.  She continues to have significant neck and radicular right arm pain.  At this point time we will plan on moving forward with a 3 level ACDF to address her lateral recess and foraminal stenosis.  Goal of surgery with reduction in her radicular arm pain, and improvement in her overall quality of life.  All the risks benefits and alternatives were discussed with the patient and her family and they all expressed an understanding.

## 2017-10-05 DIAGNOSIS — Z1231 Encounter for screening mammogram for malignant neoplasm of breast: Secondary | ICD-10-CM | POA: Diagnosis not present

## 2017-10-05 MED ORDER — BUPIVACAINE LIPOSOME 1.3 % IJ SUSP
20.0000 mL | INTRAMUSCULAR | Status: DC
  Filled 2017-10-05: qty 20

## 2017-10-06 ENCOUNTER — Inpatient Hospital Stay (HOSPITAL_COMMUNITY)
Admission: RE | Admit: 2017-10-06 | Discharge: 2017-10-07 | DRG: 473 | Disposition: A | Discharge status: Home / Self Care | Payer: Medicare HMO | Attending: Orthopedic Surgery | Admitting: Orthopedic Surgery

## 2017-10-06 ENCOUNTER — Inpatient Hospital Stay (HOSPITAL_COMMUNITY): Payer: Medicare HMO

## 2017-10-06 ENCOUNTER — Inpatient Hospital Stay (HOSPITAL_COMMUNITY): Payer: Medicare HMO | Admitting: Vascular Surgery

## 2017-10-06 ENCOUNTER — Inpatient Hospital Stay (HOSPITAL_COMMUNITY): Payer: Medicare HMO | Admitting: Certified Registered Nurse Anesthetist

## 2017-10-06 ENCOUNTER — Encounter (HOSPITAL_COMMUNITY)
Admission: RE | Disposition: A | Discharge status: Home / Self Care | Payer: Self-pay | Source: Home / Self Care | Attending: Orthopedic Surgery

## 2017-10-06 ENCOUNTER — Encounter (HOSPITAL_COMMUNITY): Payer: Self-pay | Admitting: *Deleted

## 2017-10-06 DIAGNOSIS — Z881 Allergy status to other antibiotic agents status: Secondary | ICD-10-CM

## 2017-10-06 DIAGNOSIS — I10 Essential (primary) hypertension: Secondary | ICD-10-CM | POA: Diagnosis not present

## 2017-10-06 DIAGNOSIS — J45909 Unspecified asthma, uncomplicated: Secondary | ICD-10-CM | POA: Diagnosis present

## 2017-10-06 DIAGNOSIS — Z888 Allergy status to other drugs, medicaments and biological substances status: Secondary | ICD-10-CM

## 2017-10-06 DIAGNOSIS — Z9851 Tubal ligation status: Secondary | ICD-10-CM | POA: Diagnosis not present

## 2017-10-06 DIAGNOSIS — M4322 Fusion of spine, cervical region: Secondary | ICD-10-CM | POA: Diagnosis not present

## 2017-10-06 DIAGNOSIS — K219 Gastro-esophageal reflux disease without esophagitis: Secondary | ICD-10-CM | POA: Diagnosis not present

## 2017-10-06 DIAGNOSIS — E039 Hypothyroidism, unspecified: Secondary | ICD-10-CM | POA: Diagnosis not present

## 2017-10-06 DIAGNOSIS — M4722 Other spondylosis with radiculopathy, cervical region: Secondary | ICD-10-CM | POA: Diagnosis present

## 2017-10-06 DIAGNOSIS — D869 Sarcoidosis, unspecified: Secondary | ICD-10-CM | POA: Diagnosis present

## 2017-10-06 DIAGNOSIS — E1142 Type 2 diabetes mellitus with diabetic polyneuropathy: Secondary | ICD-10-CM | POA: Diagnosis not present

## 2017-10-06 DIAGNOSIS — G4733 Obstructive sleep apnea (adult) (pediatric): Secondary | ICD-10-CM | POA: Diagnosis present

## 2017-10-06 DIAGNOSIS — E1140 Type 2 diabetes mellitus with diabetic neuropathy, unspecified: Secondary | ICD-10-CM | POA: Diagnosis present

## 2017-10-06 DIAGNOSIS — Z8249 Family history of ischemic heart disease and other diseases of the circulatory system: Secondary | ICD-10-CM

## 2017-10-06 DIAGNOSIS — E785 Hyperlipidemia, unspecified: Secondary | ICD-10-CM | POA: Diagnosis not present

## 2017-10-06 DIAGNOSIS — M5412 Radiculopathy, cervical region: Secondary | ICD-10-CM | POA: Diagnosis present

## 2017-10-06 DIAGNOSIS — Z419 Encounter for procedure for purposes other than remedying health state, unspecified: Secondary | ICD-10-CM

## 2017-10-06 DIAGNOSIS — Z8349 Family history of other endocrine, nutritional and metabolic diseases: Secondary | ICD-10-CM

## 2017-10-06 DIAGNOSIS — M50121 Cervical disc disorder at C4-C5 level with radiculopathy: Principal | ICD-10-CM | POA: Diagnosis present

## 2017-10-06 DIAGNOSIS — M797 Fibromyalgia: Secondary | ICD-10-CM | POA: Diagnosis present

## 2017-10-06 DIAGNOSIS — E7800 Pure hypercholesterolemia, unspecified: Secondary | ICD-10-CM | POA: Diagnosis present

## 2017-10-06 DIAGNOSIS — Z981 Arthrodesis status: Secondary | ICD-10-CM

## 2017-10-06 HISTORY — PX: ANTERIOR CERVICAL DECOMP/DISCECTOMY FUSION: SHX1161

## 2017-10-06 LAB — GLUCOSE, CAPILLARY
GLUCOSE-CAPILLARY: 97 mg/dL (ref 70–99)
GLUCOSE-CAPILLARY: 152 mg/dL — AB (ref 70–99)
GLUCOSE-CAPILLARY: 129 mg/dL — AB (ref 70–99)
Glucose-Capillary: 72 mg/dL (ref 70–99)

## 2017-10-06 SURGERY — ANTERIOR CERVICAL DECOMPRESSION/DISCECTOMY FUSION 3 LEVELS
Anesthesia: General | Site: Neck

## 2017-10-06 MED ORDER — BUPIVACAINE-EPINEPHRINE 0.25% -1:200000 IJ SOLN
INTRAMUSCULAR | Status: DC | PRN
  Administered 2017-10-06: 9 mL

## 2017-10-06 MED ORDER — PREGABALIN 75 MG PO CAPS
150.0000 mg | ORAL_CAPSULE | Freq: Two times a day (BID) | ORAL | Status: DC
  Administered 2017-10-06 – 2017-10-07 (×3): 150 mg via ORAL
  Filled 2017-10-06 (×3): qty 2

## 2017-10-06 MED ORDER — SERTRALINE HCL 50 MG PO TABS
50.0000 mg | ORAL_TABLET | Freq: Every day | ORAL | Status: DC
  Administered 2017-10-06: 50 mg via ORAL
  Filled 2017-10-06: qty 1

## 2017-10-06 MED ORDER — BUPIVACAINE-EPINEPHRINE (PF) 0.25% -1:200000 IJ SOLN
INTRAMUSCULAR | Status: AC
  Filled 2017-10-06: qty 30

## 2017-10-06 MED ORDER — ONDANSETRON HCL 4 MG/2ML IJ SOLN
INTRAMUSCULAR | Status: AC
  Filled 2017-10-06: qty 2

## 2017-10-06 MED ORDER — LACTATED RINGERS IV SOLN: INTRAVENOUS | Status: DC

## 2017-10-06 MED ORDER — LACTATED RINGERS IV SOLN
INTRAVENOUS | Status: DC
  Administered 2017-10-06 (×3): via INTRAVENOUS

## 2017-10-06 MED ORDER — DEXAMETHASONE SODIUM PHOSPHATE 10 MG/ML IJ SOLN
INTRAMUSCULAR | Status: AC
  Filled 2017-10-06: qty 1

## 2017-10-06 MED ORDER — ROCURONIUM BROMIDE 100 MG/10ML IV SOLN
INTRAVENOUS | Status: DC | PRN
  Administered 2017-10-06 (×2): 50 mg via INTRAVENOUS

## 2017-10-06 MED ORDER — THROMBIN (RECOMBINANT) 20000 UNITS EX SOLR
CUTANEOUS | Status: AC
  Filled 2017-10-06: qty 20000

## 2017-10-06 MED ORDER — ONDANSETRON HCL 4 MG/2ML IJ SOLN: 4.0000 mg | Freq: Four times a day (QID) | INTRAMUSCULAR | Status: DC | PRN

## 2017-10-06 MED ORDER — FENTANYL CITRATE (PF) 250 MCG/5ML IJ SOLN
INTRAMUSCULAR | Status: AC
  Filled 2017-10-06: qty 5

## 2017-10-06 MED ORDER — ONDANSETRON HCL 4 MG/2ML IJ SOLN
INTRAMUSCULAR | Status: DC | PRN
  Administered 2017-10-06: 4 mg via INTRAVENOUS

## 2017-10-06 MED ORDER — THROMBIN 20000 UNITS EX SOLR
CUTANEOUS | Status: DC | PRN
  Administered 2017-10-06: 20 mL via TOPICAL

## 2017-10-06 MED ORDER — MOMETASONE FURO-FORMOTEROL FUM 200-5 MCG/ACT IN AERO
2.0000 | INHALATION_SPRAY | Freq: Two times a day (BID) | RESPIRATORY_TRACT | Status: DC
  Administered 2017-10-07: 2 via RESPIRATORY_TRACT
  Filled 2017-10-06: qty 8.8

## 2017-10-06 MED ORDER — ACETAMINOPHEN 10 MG/ML IV SOLN
INTRAVENOUS | Status: DC | PRN
  Administered 2017-10-06: 1000 mg via INTRAVENOUS

## 2017-10-06 MED ORDER — HYDROMORPHONE HCL 1 MG/ML IJ SOLN
INTRAMUSCULAR | Status: AC
  Filled 2017-10-06: qty 1

## 2017-10-06 MED ORDER — ACETAMINOPHEN 650 MG RE SUPP: 650.0000 mg | RECTAL | Status: DC | PRN

## 2017-10-06 MED ORDER — CEFAZOLIN SODIUM-DEXTROSE 2-4 GM/100ML-% IV SOLN
INTRAVENOUS | Status: AC
  Filled 2017-10-06: qty 100

## 2017-10-06 MED ORDER — OXYCODONE HCL 5 MG PO TABS
10.0000 mg | ORAL_TABLET | ORAL | Status: DC | PRN
  Administered 2017-10-06 – 2017-10-07 (×7): 10 mg via ORAL
  Filled 2017-10-06 (×7): qty 2

## 2017-10-06 MED ORDER — MENTHOL 3 MG MT LOZG: 1.0000 | LOZENGE | OROMUCOSAL | Status: DC | PRN

## 2017-10-06 MED ORDER — FENTANYL CITRATE (PF) 100 MCG/2ML IJ SOLN
INTRAMUSCULAR | Status: DC | PRN
  Administered 2017-10-06 (×2): 50 ug via INTRAVENOUS
  Administered 2017-10-06: 100 ug via INTRAVENOUS
  Administered 2017-10-06: 50 ug via INTRAVENOUS
  Administered 2017-10-06: 100 ug via INTRAVENOUS
  Administered 2017-10-06: 50 ug via INTRAVENOUS

## 2017-10-06 MED ORDER — HEMOSTATIC AGENTS (NO CHARGE) OPTIME
TOPICAL | Status: DC | PRN
  Administered 2017-10-06: 1

## 2017-10-06 MED ORDER — LIDOCAINE 2% (20 MG/ML) 5 ML SYRINGE
INTRAMUSCULAR | Status: AC
  Filled 2017-10-06: qty 5

## 2017-10-06 MED ORDER — MAGNESIUM CITRATE PO SOLN: 1.0000 | Freq: Once | ORAL | Status: DC | PRN

## 2017-10-06 MED ORDER — SODIUM CHLORIDE 0.9% FLUSH: 3.0000 mL | Freq: Two times a day (BID) | INTRAVENOUS | Status: DC

## 2017-10-06 MED ORDER — EPHEDRINE SULFATE 50 MG/ML IJ SOLN
INTRAMUSCULAR | Status: DC | PRN
  Administered 2017-10-06 (×2): 10 mg via INTRAVENOUS

## 2017-10-06 MED ORDER — 0.9 % SODIUM CHLORIDE (POUR BTL) OPTIME
TOPICAL | Status: DC | PRN
  Administered 2017-10-06 (×2): 1000 mL

## 2017-10-06 MED ORDER — MORPHINE SULFATE (PF) 2 MG/ML IV SOLN: 1.0000 mg | INTRAVENOUS | Status: DC | PRN

## 2017-10-06 MED ORDER — PRAVASTATIN SODIUM 40 MG PO TABS
40.0000 mg | ORAL_TABLET | Freq: Every day | ORAL | Status: DC
  Administered 2017-10-06 – 2017-10-07 (×2): 40 mg via ORAL
  Filled 2017-10-06 (×2): qty 1

## 2017-10-06 MED ORDER — LOSARTAN POTASSIUM 50 MG PO TABS
25.0000 mg | ORAL_TABLET | Freq: Every day | ORAL | Status: DC
  Administered 2017-10-06: 25 mg via ORAL
  Filled 2017-10-06: qty 1

## 2017-10-06 MED ORDER — LIDOCAINE HCL (CARDIAC) PF 100 MG/5ML IV SOSY
PREFILLED_SYRINGE | INTRAVENOUS | Status: DC | PRN
  Administered 2017-10-06: 60 mg via INTRAVENOUS

## 2017-10-06 MED ORDER — OXYCODONE HCL 5 MG PO TABS
ORAL_TABLET | ORAL | Status: AC
  Filled 2017-10-06: qty 1

## 2017-10-06 MED ORDER — DEXAMETHASONE SODIUM PHOSPHATE 10 MG/ML IJ SOLN
INTRAMUSCULAR | Status: DC | PRN
  Administered 2017-10-06: 5 mg via INTRAVENOUS

## 2017-10-06 MED ORDER — MIDAZOLAM HCL 2 MG/2ML IJ SOLN
INTRAMUSCULAR | Status: AC
  Filled 2017-10-06: qty 2

## 2017-10-06 MED ORDER — MEPERIDINE HCL 50 MG/ML IJ SOLN: 6.2500 mg | INTRAMUSCULAR | Status: DC | PRN

## 2017-10-06 MED ORDER — SUGAMMADEX SODIUM 200 MG/2ML IV SOLN
INTRAVENOUS | Status: DC | PRN
  Administered 2017-10-06: 200 mg via INTRAVENOUS

## 2017-10-06 MED ORDER — HYDROMORPHONE HCL 1 MG/ML IJ SOLN
0.2500 mg | INTRAMUSCULAR | Status: DC | PRN
  Administered 2017-10-06 (×4): 0.5 mg via INTRAVENOUS

## 2017-10-06 MED ORDER — CEFAZOLIN SODIUM-DEXTROSE 2-4 GM/100ML-% IV SOLN
2.0000 g | Freq: Three times a day (TID) | INTRAVENOUS | Status: AC
  Administered 2017-10-06 (×2): 2 g via INTRAVENOUS
  Filled 2017-10-06 (×2): qty 100

## 2017-10-06 MED ORDER — ACETAMINOPHEN 10 MG/ML IV SOLN
INTRAVENOUS | Status: AC
  Filled 2017-10-06: qty 100

## 2017-10-06 MED ORDER — FAMOTIDINE 20 MG PO TABS
10.0000 mg | ORAL_TABLET | Freq: Every day | ORAL | Status: DC
  Administered 2017-10-06 – 2017-10-07 (×2): 10 mg via ORAL
  Filled 2017-10-06 (×2): qty 1

## 2017-10-06 MED ORDER — ONDANSETRON HCL 4 MG PO TABS: 4.0000 mg | ORAL_TABLET | Freq: Four times a day (QID) | ORAL | Status: DC | PRN

## 2017-10-06 MED ORDER — DOCUSATE SODIUM 100 MG PO CAPS
100.0000 mg | ORAL_CAPSULE | Freq: Two times a day (BID) | ORAL | Status: DC
  Administered 2017-10-06 – 2017-10-07 (×3): 100 mg via ORAL
  Filled 2017-10-06 (×3): qty 1

## 2017-10-06 MED ORDER — FLUTICASONE PROPIONATE 50 MCG/ACT NA SUSP
1.0000 | Freq: Every day | NASAL | Status: DC
  Administered 2017-10-06 – 2017-10-07 (×2): 1 via NASAL
  Filled 2017-10-06: qty 16

## 2017-10-06 MED ORDER — ACETAMINOPHEN 325 MG PO TABS
650.0000 mg | ORAL_TABLET | ORAL | Status: DC | PRN
  Administered 2017-10-06: 650 mg via ORAL
  Filled 2017-10-06: qty 2

## 2017-10-06 MED ORDER — INSULIN ASPART 100 UNIT/ML ~~LOC~~ SOLN: 0.0000 [IU] | SUBCUTANEOUS | Status: DC

## 2017-10-06 MED ORDER — NITROGLYCERIN 0.4 MG SL SUBL: 0.4000 mg | SUBLINGUAL_TABLET | SUBLINGUAL | Status: DC | PRN

## 2017-10-06 MED ORDER — LORATADINE 10 MG PO TABS
10.0000 mg | ORAL_TABLET | Freq: Every day | ORAL | Status: DC
  Administered 2017-10-06: 10 mg via ORAL
  Filled 2017-10-06: qty 1

## 2017-10-06 MED ORDER — PROPOFOL 10 MG/ML IV BOLUS
INTRAVENOUS | Status: DC | PRN
  Administered 2017-10-06: 150 mg via INTRAVENOUS

## 2017-10-06 MED ORDER — SODIUM CHLORIDE 0.9% FLUSH: 3.0000 mL | INTRAVENOUS | Status: DC | PRN

## 2017-10-06 MED ORDER — METHOCARBAMOL 500 MG PO TABS: 500.0000 mg | ORAL_TABLET | Freq: Three times a day (TID) | ORAL | 0 refills | Status: DC

## 2017-10-06 MED ORDER — METHOCARBAMOL 500 MG PO TABS
500.0000 mg | ORAL_TABLET | Freq: Four times a day (QID) | ORAL | Status: DC | PRN
  Administered 2017-10-06 – 2017-10-07 (×5): 500 mg via ORAL
  Filled 2017-10-06 (×4): qty 1

## 2017-10-06 MED ORDER — METFORMIN HCL 500 MG PO TABS
1000.0000 mg | ORAL_TABLET | Freq: Two times a day (BID) | ORAL | Status: DC
  Administered 2017-10-06 – 2017-10-07 (×2): 1000 mg via ORAL
  Filled 2017-10-06 (×2): qty 2

## 2017-10-06 MED ORDER — INSULIN ASPART 100 UNIT/ML ~~LOC~~ SOLN
0.0000 [IU] | Freq: Three times a day (TID) | SUBCUTANEOUS | Status: DC
  Administered 2017-10-06: 3 [IU] via SUBCUTANEOUS
  Administered 2017-10-07: 2 [IU] via SUBCUTANEOUS

## 2017-10-06 MED ORDER — ALBUTEROL SULFATE (2.5 MG/3ML) 0.083% IN NEBU: 3.0000 mL | INHALATION_SOLUTION | Freq: Four times a day (QID) | RESPIRATORY_TRACT | Status: DC | PRN

## 2017-10-06 MED ORDER — MIDAZOLAM HCL 5 MG/5ML IJ SOLN
INTRAMUSCULAR | Status: DC | PRN
  Administered 2017-10-06: 2 mg via INTRAVENOUS

## 2017-10-06 MED ORDER — PHENOL 1.4 % MT LIQD: 1.0000 | OROMUCOSAL | Status: DC | PRN

## 2017-10-06 MED ORDER — PHENYLEPHRINE HCL 10 MG/ML IJ SOLN
INTRAMUSCULAR | Status: DC | PRN
  Administered 2017-10-06: 80 ug via INTRAVENOUS

## 2017-10-06 MED ORDER — METHOCARBAMOL 500 MG PO TABS
ORAL_TABLET | ORAL | Status: AC
  Filled 2017-10-06: qty 1

## 2017-10-06 MED ORDER — EPHEDRINE 5 MG/ML INJ
INTRAVENOUS | Status: AC
  Filled 2017-10-06: qty 10

## 2017-10-06 MED ORDER — PROPOFOL 10 MG/ML IV BOLUS
INTRAVENOUS | Status: AC
  Filled 2017-10-06: qty 20

## 2017-10-06 MED ORDER — OXYCODONE-ACETAMINOPHEN 10-325 MG PO TABS: 1.0000 | ORAL_TABLET | ORAL | 0 refills | Status: AC | PRN

## 2017-10-06 MED ORDER — ONDANSETRON 4 MG PO TBDP: 4.0000 mg | ORAL_TABLET | Freq: Three times a day (TID) | ORAL | 0 refills | Status: DC | PRN

## 2017-10-06 MED ORDER — ROCURONIUM BROMIDE 50 MG/5ML IV SOSY
PREFILLED_SYRINGE | INTRAVENOUS | Status: AC
  Filled 2017-10-06: qty 10

## 2017-10-06 MED ORDER — MONTELUKAST SODIUM 10 MG PO TABS
10.0000 mg | ORAL_TABLET | Freq: Every day | ORAL | Status: DC
  Administered 2017-10-06: 10 mg via ORAL
  Filled 2017-10-06 (×2): qty 1

## 2017-10-06 MED ORDER — METHOCARBAMOL 1000 MG/10ML IJ SOLN
500.0000 mg | Freq: Four times a day (QID) | INTRAVENOUS | Status: DC | PRN
  Filled 2017-10-06: qty 5

## 2017-10-06 MED ORDER — OXYCODONE HCL 5 MG PO TABS
5.0000 mg | ORAL_TABLET | ORAL | Status: DC | PRN
  Administered 2017-10-06: 5 mg via ORAL

## 2017-10-06 MED ORDER — PHENYLEPHRINE 40 MCG/ML (10ML) SYRINGE FOR IV PUSH (FOR BLOOD PRESSURE SUPPORT)
PREFILLED_SYRINGE | INTRAVENOUS | Status: AC
  Filled 2017-10-06: qty 10

## 2017-10-06 MED ORDER — CEFAZOLIN SODIUM-DEXTROSE 2-4 GM/100ML-% IV SOLN
2.0000 g | INTRAVENOUS | Status: AC
  Administered 2017-10-06 (×2): 2 g via INTRAVENOUS

## 2017-10-06 MED ORDER — POLYETHYLENE GLYCOL 3350 17 G PO PACK: 17.0000 g | PACK | Freq: Every day | ORAL | Status: DC | PRN

## 2017-10-06 MED ORDER — PROMETHAZINE HCL 25 MG/ML IJ SOLN: 6.2500 mg | INTRAMUSCULAR | Status: DC | PRN

## 2017-10-06 MED ORDER — LEVOTHYROXINE SODIUM 25 MCG PO TABS
25.0000 ug | ORAL_TABLET | Freq: Every day | ORAL | Status: DC
  Administered 2017-10-07: 25 ug via ORAL
  Filled 2017-10-06: qty 1

## 2017-10-06 SURGICAL SUPPLY — 70 items
BIT DRILL CERVICAL ACP STND 12 (DRILL) ×1 IMPLANT
BONE VIVIGEN FORMABLE 1.3CC (Bone Implant) ×4 IMPLANT
CABLE BIPOLOR RESECTION CORD (MISCELLANEOUS) ×2 IMPLANT
CANISTER SUCT 3000ML PPV (MISCELLANEOUS) ×2 IMPLANT
CLSR STERI-STRIP ANTIMIC 1/2X4 (GAUZE/BANDAGES/DRESSINGS) ×2 IMPLANT
COVER MAYO STAND STRL (DRAPES) ×6 IMPLANT
COVER SURGICAL LIGHT HANDLE (MISCELLANEOUS) ×2 IMPLANT
COVER WAND RF STERILE (DRAPES) ×2 IMPLANT
CRADLE DONUT ADULT HEAD (MISCELLANEOUS) ×2 IMPLANT
DEVICE ENDSKLTN IMPL 16X14X7X6 (Cage) ×2 IMPLANT
DEVICE ENDSKLTN TC NANOLCK 6MM (Cage) ×1 IMPLANT
DRAPE C-ARM 42X72 X-RAY (DRAPES) ×2 IMPLANT
DRAPE MICROSCOPE LEICA 46X105 (MISCELLANEOUS) IMPLANT
DRAPE POUCH INSTRU U-SHP 10X18 (DRAPES) ×2 IMPLANT
DRAPE SURG 17X23 STRL (DRAPES) ×2 IMPLANT
DRAPE U-SHAPE 47X51 STRL (DRAPES) ×2 IMPLANT
DRILL CERVICAL ACP STNDRD 12 (DRILL) ×2
DRSG OPSITE POSTOP 4X6 (GAUZE/BANDAGES/DRESSINGS) ×2 IMPLANT
DURAPREP 26ML APPLICATOR (WOUND CARE) ×2 IMPLANT
ELECT COATED BLADE 2.86 ST (ELECTRODE) ×2 IMPLANT
ELECT PENCIL ROCKER SW 15FT (MISCELLANEOUS) ×2 IMPLANT
ELECT REM PT RETURN 9FT ADLT (ELECTROSURGICAL) ×2
ELECTRODE REM PT RTRN 9FT ADLT (ELECTROSURGICAL) ×1 IMPLANT
ENDOSKELETON IMPLANT 16X14X7X6 (Cage) ×4 IMPLANT
ENDOSKELETON TC NANOLOCK 6MM (Cage) ×2 IMPLANT
FLOSEAL 10ML (HEMOSTASIS) IMPLANT
GLOVE BIO SURGEON STRL SZ 6.5 (GLOVE) ×2 IMPLANT
GLOVE BIOGEL PI IND STRL 6.5 (GLOVE) ×1 IMPLANT
GLOVE BIOGEL PI IND STRL 8.5 (GLOVE) ×1 IMPLANT
GLOVE BIOGEL PI INDICATOR 6.5 (GLOVE) ×1
GLOVE BIOGEL PI INDICATOR 8.5 (GLOVE) ×1
GLOVE SS BIOGEL STRL SZ 8.5 (GLOVE) ×2 IMPLANT
GLOVE SUPERSENSE BIOGEL SZ 8.5 (GLOVE) ×2
GOWN STRL REUS W/ TWL LRG LVL3 (GOWN DISPOSABLE) ×3 IMPLANT
GOWN STRL REUS W/TWL 2XL LVL3 (GOWN DISPOSABLE) ×2 IMPLANT
GOWN STRL REUS W/TWL LRG LVL3 (GOWN DISPOSABLE) ×3
KIT BASIN OR (CUSTOM PROCEDURE TRAY) ×2 IMPLANT
KIT TURNOVER KIT B (KITS) ×2 IMPLANT
NEEDLE HYPO 22GX1.5 SAFETY (NEEDLE) ×2 IMPLANT
NEEDLE SPNL 18GX3.5 QUINCKE PK (NEEDLE) ×2 IMPLANT
NS IRRIG 1000ML POUR BTL (IV SOLUTION) ×2 IMPLANT
PACK ORTHO CERVICAL (CUSTOM PROCEDURE TRAY) ×2 IMPLANT
PACK UNIVERSAL I (CUSTOM PROCEDURE TRAY) ×2 IMPLANT
PAD ARMBOARD 7.5X6 YLW CONV (MISCELLANEOUS) ×6 IMPLANT
PATTIES SURGICAL .25X.25 (GAUZE/BANDAGES/DRESSINGS) ×2 IMPLANT
PATTIES SURGICAL .5 X.5 (GAUZE/BANDAGES/DRESSINGS) IMPLANT
PIN DISTRACTION 14 (PIN) ×4 IMPLANT
PLATE ACP SL 53MM 3LVL (Plate) ×2 IMPLANT
RESTRAINT LIMB HOLDER UNIV (RESTRAINTS) ×2 IMPLANT
RUBBERBAND STERILE (MISCELLANEOUS) IMPLANT
SCREW BONE VA NEO/K-SL 4X12 (Screw) ×8 IMPLANT
SCREW BONE VA NEO/K-SL 4X14 (Screw) ×8 IMPLANT
SCREW RELINE MAS POLY 5.5X35MM (Screw) ×2 IMPLANT
SPONGE INTESTINAL PEANUT (DISPOSABLE) ×4 IMPLANT
SPONGE LAP 4X18 RFD (DISPOSABLE) ×4 IMPLANT
SPONGE SURGIFOAM ABS GEL 100 (HEMOSTASIS) ×2 IMPLANT
SURGIFLO W/THROMBIN 8M KIT (HEMOSTASIS) ×2 IMPLANT
SUT BONE WAX W31G (SUTURE) ×2 IMPLANT
SUT MON AB 3-0 SH 27 (SUTURE) ×1
SUT MON AB 3-0 SH27 (SUTURE) ×1 IMPLANT
SUT SILK 2 0 (SUTURE) ×1
SUT SILK 2-0 18XBRD TIE 12 (SUTURE) ×1 IMPLANT
SUT VIC AB 2-0 CT1 18 (SUTURE) ×2 IMPLANT
SYR BULB IRRIGATION 50ML (SYRINGE) ×2 IMPLANT
SYR CONTROL 10ML LL (SYRINGE) ×2 IMPLANT
TAPE CLOTH 4X10 WHT NS (GAUZE/BANDAGES/DRESSINGS) ×2 IMPLANT
TAPE UMBILICAL COTTON 1/8X30 (MISCELLANEOUS) ×2 IMPLANT
TOWEL GREEN STERILE (TOWEL DISPOSABLE) ×2 IMPLANT
TOWEL GREEN STERILE FF (TOWEL DISPOSABLE) ×2 IMPLANT
TRAY FOLEY MTR SLVR 16FR STAT (SET/KITS/TRAYS/PACK) ×2 IMPLANT

## 2017-10-06 NOTE — Brief Op Note (Signed)
10/06/2017  1:17 PM  PATIENT:  [name]  62 y.o. female  PRE-OPERATIVE DIAGNOSIS:  Cervical spondylotic radiculopathy  POST-OPERATIVE DIAGNOSIS:  Cervical spondylotic radiculopathy  PROCEDURE:  Procedure(s) with comments: ANTERIOR CERVICAL DECOMPRESSION/DISCECTOMY FUSION C4-7 (N/A) - 4.5 hrs  SURGEON:  Surgeon(s) and Role:    Melina Schools, MD - Primary  PHYSICIAN ASSISTANT:   ASSISTANTS: Carmen Mayo   ANESTHESIA:   general  EBL:  75 mL   BLOOD ADMINISTERED:none  DRAINS: none   LOCAL MEDICATIONS USED:  [place]     SPECIMEN:  No Specimen  DISPOSITION OF SPECIMEN:  N/A  COUNTS:  YES  TOURNIQUET:  * No tourniquets in log *  DICTATION: .Dragon Dictation  PLAN OF CARE: Admit for overnight observation  PATIENT DISPOSITION:  PACU - hemodynamically stable.

## 2017-10-06 NOTE — Anesthesia Procedure Notes (Signed)
Procedure Name: Intubation Date/Time: 10/06/2017 8:47 AM Performed by: Mariel Lukins T, CRNA Pre-anesthesia Checklist: Patient identified, Emergency Drugs available, Suction available and Patient being monitored Patient Re-evaluated:Patient Re-evaluated prior to induction Oxygen Delivery Method: Circle system utilized Preoxygenation: Pre-oxygenation with 100% oxygen Induction Type: IV induction Ventilation: Mask ventilation without difficulty Laryngoscope Size: Miller and 2 Grade View: Grade II Tube type: Oral Tube size: 7.5 mm Number of attempts: 1 Airway Equipment and Method: Patient positioned with wedge pillow and Stylet Placement Confirmation: ETT inserted through vocal cords under direct vision,  positive ETCO2 and breath sounds checked- equal and bilateral Secured at: 21 cm Tube secured with: Tape Dental Injury: Teeth and Oropharynx as per pre-operative assessment

## 2017-10-06 NOTE — Op Note (Signed)
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  C5-6 and C6-7: 7 mm medium lordotic spacer.  Allograft: vivogen  Cervical plate: Kinetic translational cervical plate (life spine): 53 mm length plate 14 mm length screws at C4 and C7.  12 mm length screws at C5 and C6.  Indications: This is a very pleasant 62 year old woman who presents with debilitating neck and radicular right arm pain.  Attempts at conservative management had failed to alleviate her symptoms and she continues to suffer.  As a result we elected to move forward with surgery.  All appropriate risks benefits and alternatives to surgery were discussed with the patient and her family and consent was obtained.  Operative report: Patient was brought the operating room placed by the operating room table.  After successful induction of general anesthesia and endotracheal intubation teds SCDs and a Foley were inserted.  The anterior cervical spine was then prepped and draped in a standard fashion.  Timeout was taken to confirm patient procedure and all other important data.  X-ray was then used to identify the C4 and C7 vertebral bodies and I marked out a left-sided longitudinal incision.  This incision was infiltrated with quarter percent Marcaine with epinephrine and then a longitudinal incision was made in line with the sternocleidomastoid.  This was a standard Smith-Robinson approach to the anterior cervical spine.  I sharply dissected down to the platysma and then isolated and transected the platysma.  I continued dissecting along the medial border of the sternocleidomastoid into the deep cervical fascia.  I identified isolated the omohyoid muscle and sacrificed for improved visualization.  I continued dissecting  down into the prevertebral fascia until I was able to sleep the esophagus and trachea off to the right.  I then visualized and palpated and protected the carotid sheath with a finger.  Using Kitner dissectors I then removed mobilized the remaining prevertebral fascia to completely expose all 3 disc spaces.  A needle was placed into the cc for 5 disc space and an x-ray was taken confirming I was at the appropriate level.  Once confirmed I then mobilized the longus coli muscle using bipolar electrocautery from the superior portion of C4 to the inferior portion of C7.  Once this was done bilaterally I was able to place my Caspar retracting blade under the longus coli muscle and then remove the anterior exostosis from each of the disc spaces using a double-action Scientist, forensic.  Once the approach was complete I placed my retractors 7 disc space deflated the endotracheal cuff and expanded the retracting system to the appropriate width.  The endotracheal cuff was then reinflated and then an annulotomy was performed with a 15 blade scalpel using pituitary rongeurs to remove the bulk of the disc material and then I used a 2 mm Kerrison Roger to take down the overhanging osteophyte from the inferior aspect of distraction pins were then placed into the body of C6 and C7 and distracted the intervertebral space.  This distraction was maintained using pins.  Continued with my neuro curettes resecting the cartilaginous endplate as well as the disc material.  Once I was nearing the posterior annulus I used my 1 mm Kerrison Roger to remove the posterior osteophytes from the body of C6 and the body of C7.  Using a fine  neuro hook I developed a plane underneath the posterior longitudinal ligament and then use my 1 mm Kerrison Roger to resect the PLL.  PLL resected I was able to confirm an adequate decompression.  At C6-7 and the left posterior lateral corner there was a fragment of disc material that I was able  to remove.  Once the PLL was resected I was able to undercut the uncovertebral joint to perform an adequate indirect foraminal decompression.  Decompression discectomy complete I then trialed the intervertebral space and elected to use the size 7 medium lordotic spacer.  The implant was obtained and packed with the allograft and malleted to the appropriate depth.  Imaging studies confirmed satisfactory position of the intervertebral cage.  The C6-7 level done I then proceeded to the C5-6 level.  The retracting blades were repositioned at the C5-6 level as where the distraction pins.  An annulotomy was performed using the same exact technique I performed a complete discectomy at C5-6.  At this level there was more of a central disc herniation that was removed.  Once this was I was able to use the nerve hook to develop a plane under the posterior longitudinal ligament and I again resected the PLL using a 1 mm Kerrison.  I also remove the posterior osteophytes from the vertebral body as well as undercutting the uncovertebral joint to adequately decompress the nerve.  With the discectomy complete I trialed and used the same exact implant.  Again it was malleted to the appropriate depth which was confirmed using fluoroscopy.  Retracting blades were then repositioned at the 4 5 level and using the same technique I performed an ACDF at C4-5.  Again the PLL was taken down and removed with a 1 mm Kerrison Roger and the uncovertebral joints were decompressed as well.  Small fragments of disc were removed consistent with what was seen on the preoperative MRI.  The intervertebral space was trialed and a 6 mm lordotic implant was changed and malleted to the the appropriate depth.  The distraction pins were removed and bleeding edges of bone were sealed with bone wax and I irrigated the wound copiously normal saline.  Appropriate size plate was obtained and secured with self drilling screws.  All screws had excellent  purchase and the plate was properly situated.  A temporary plastic spacers to prevent collapse of the plate were removed and the wound was copiously irrigated with normal saline.  I confirmed hemostasis using bipolar electrocautery and FloSeal.  I then checked again to ensure the esophagus did not become inadvertently trapped beneath the plate.  Once I confirmed that the esophagus was free I returned to the midline position final x-rays were taken which demonstrated satisfactory position of the implant in the intervertebral spaces in both the AP and lateral planes.  The wound was again copiously irrigated with normal saline and I confirmed hemostasis.  I closed the platysma with interrupted 2-0 Vicryl sutures and the skin with a 3-0 Monocryl.  Steri-Strips and a dry dressing were applied and the patient was ultimately extubated and transferred the PACU without incident.  The end of the case all needle sponge counts were correct.  There are no adverse intraoperative events.

## 2017-10-06 NOTE — Transfer of Care (Signed)
Immediate Anesthesia Transfer of Care Note  Patient: Melanie Meyers  Procedure(s) Performed: ANTERIOR CERVICAL DECOMPRESSION/DISCECTOMY FUSION C4-7 (N/A Neck)  Patient Location: PACU  Anesthesia Type:General  Level of Consciousness: awake, alert  and oriented  Airway & Oxygen Therapy: Patient Spontanous Breathing and Patient connected to nasal cannula oxygen  Post-op Assessment: Report given to RN, Post -op Vital signs reviewed and stable and Patient moving all extremities  Post vital signs: Reviewed and stable  Last Vitals:  Vitals Value Taken Time  BP 151/85 10/06/2017  1:25 PM  Temp    Pulse 87 10/06/2017  1:26 PM  Resp 18 10/06/2017  1:26 PM  SpO2 98 % 10/06/2017  1:26 PM  Vitals shown include unvalidated device data.  Last Pain:  Vitals:   10/06/17 0647  TempSrc: Oral  PainSc:          Complications: No apparent anesthesia complications

## 2017-10-06 NOTE — Discharge Instructions (Signed)
Cervical Fusion, Care After °This sheet gives you information about how to care for yourself after your procedure. Your health care provider may also give you more specific instructions. If you have problems or questions, contact your health care provider. °What can I expect after the procedure? °After the procedure, it is common to have: °· Incision area pain. °· Numbness. °· Weakness. °· Sore throat. °· Difficulty swallowing. ° °Follow these instructions at home: °Medicines °· Take over-the-counter and prescription medicines, including pain medicines, only as told by your health care provider. °· If you were prescribed an antibiotic medicine, take it as told by your health care provider. Do not stop taking the antibiotic even if you start to feel better. °If you have a brace: °· Wear the brace as told by your health care provider. Remove it only as told by your health care provider. °· Keep the brace clean. °Incision care °· Follow instructions from your health care provider about how to take care of your incision. Make sure you: °? Wash your hands with soap and water before you change your bandage (dressing). If soap and water are not available, use hand sanitizer. °? Change your dressing as told by your health care provider. °? Leave stitches (sutures), skin glue, or adhesive strips in place. These skin closures may need to be in place for 2 weeks or longer. If adhesive strip edges start to loosen and curl up, you may trim the loose edges. Do not remove adhesive strips completely unless your health care provider tells you to do that. °· Keep your incision clean and dry. Do not take baths, swim, or use a hot tub until your health care provider approves. °· Check your incision area every day for signs of infection. Check for: °? More redness, swelling, or pain. °? More fluid or blood. °? Warmth. °? Pus or a bad smell. °Activity ° °· Rest and protect your back as much as possible. °· Do not lift anything that is  heavier than 10 lb (4.5 kg) or the limit that you are told by your health care provider. °· Do not twist or bend at the waist until your health care provider approves. °· Avoid: °? Pushing and pulling motions. °? Lifting anything over your head. °? Sitting or lying down in the same position for long periods of time. °· Do not exercise until your health care provider approves. Once your health care provider has approved exercise, ask him or her what kinds of exercises you can do to make your back stronger (physical therapy). °· Do not drive until your health care provider approves. °? Do not drive for 24 hours if you received a medicine to help you relax (sedative). °? Do not drive or use heavy machinery while taking prescription pain medicine. °General instructions ° °· Have someone assist you to turn in bed frequently by moving your whole body without twisting your back (log rolling technique). °· Wear compression stockings as told by your health care provider. These stockings help to prevent blood clots and reduce swelling in your legs. °· Do not use any products that contain nicotine or tobacco, such as cigarettes and e-cigarettes. These can delay bone healing. If you need help quitting, ask your health care provider. °· To prevent or treat constipation while you are taking prescription pain medicine, your health care provider may recommend that you: °? Drink enough fluid to keep your urine clear or pale yellow. °? Take over-the-counter or prescription medicines. °? Eat foods   that are high in fiber, such as fresh fruits and vegetables, whole grains, and beans. °? Limit foods that are high in fat and processed sugars, such as fried and sweet foods. °· Keep all follow-up visits as told by your health care provider. This is important. °Contact a health care provider if: °· You have pain that gets worse or does not get better with medicine. °· You have more redness, swelling, or pain around your incision. °· You have  more fluid or blood coming from your incision. °· Your incision feels warm to the touch. °· You have pus or a bad smell coming from your incision. °· You have a fever. °· You have weakness or numbness in your legs that is new or getting worse. °· You have swelling in your calf or leg. °· The edges of your incision break open. °· You vomit or feel nauseous. °· You have trouble controlling urination or bowel movements. °Get help right away if: °· You develop shortness of breath or chest pain. °· You have trouble swallowing. °· You have trouble breathing. °· You develop a cough. °This information is not intended to replace advice given to you by your health care provider. Make sure you discuss any questions you have with your health care provider. °Document Released: 08/06/2003 Document Revised: 07/17/2015 Document Reviewed: 07/03/2015 °Elsevier Interactive Patient Education © 2018 [employer] ° ° ° ° ° ° °

## 2017-10-06 NOTE — Anesthesia Preprocedure Evaluation (Addendum)
Anesthesia Evaluation  Patient identified by MRN, date of birth, ID band Patient awake    Reviewed: Allergy & Precautions, NPO status , Patient's Chart, lab work & pertinent test results  Airway Mallampati: I  TM Distance: >3 FB Neck ROM: Full    Dental  (+) Teeth Intact, Dental Advisory Given   Pulmonary asthma , sleep apnea and Continuous Positive Airway Pressure Ventilation ,    breath sounds clear to auscultation       Cardiovascular hypertension,  Rhythm:Regular Rate:Normal     Neuro/Psych  Headaches, Depression  Neuromuscular disease    GI/Hepatic Neg liver ROS, GERD  Medicated,  Endo/Other  diabetes, Type 2, Oral Hypoglycemic Agents, Insulin DependentHypothyroidism   Renal/GU negative Renal ROS     Musculoskeletal  (+) Arthritis , Fibromyalgia -  Abdominal (+) + obese,   Peds  Hematology   Anesthesia Other Findings   Reproductive/Obstetrics                            Lab Results  Component Value Date   WBC 9.5 09/28/2017   HGB 12.5 09/28/2017   HCT 40.7 09/28/2017   MCV 82.7 09/28/2017   PLT 280 09/28/2017   Lab Results  Component Value Date   CREATININE 0.97 09/28/2017   BUN 22 09/28/2017   NA 139 09/28/2017   K 4.4 09/28/2017   CL 104 09/28/2017   CO2 24 09/28/2017   Lab Results  Component Value Date   INR 0.96 07/25/2015   EKG: normal sinus rhythm.  Cath: 1. Nonobstructive CAD 2. Normal LV function 3. Normal LVEDP   Anesthesia Physical Anesthesia Plan  ASA: III  Anesthesia Plan: General   Post-op Pain Management:    Induction: Intravenous  PONV Risk Score and Plan: 4 or greater and Ondansetron, Dexamethasone, Midazolam and Scopolamine patch - Pre-op  Airway Management Planned: Oral ETT and Video Laryngoscope Planned  Additional Equipment: None  Intra-op Plan:   Post-operative Plan: Extubation in OR  Informed Consent: I have reviewed the  patients History and Physical, chart, labs and discussed the procedure including the risks, benefits and alternatives for the proposed anesthesia with the patient or authorized representative who has indicated his/her understanding and acceptance.   Dental advisory given  Plan Discussed with: CRNA  Anesthesia Plan Comments:        Anesthesia Quick Evaluation

## 2017-10-07 ENCOUNTER — Encounter (HOSPITAL_COMMUNITY): Payer: Self-pay | Admitting: Orthopedic Surgery

## 2017-10-07 ENCOUNTER — Other Ambulatory Visit: Payer: Self-pay

## 2017-10-07 LAB — GLUCOSE, CAPILLARY
GLUCOSE-CAPILLARY: 83 mg/dL (ref 70–99)
Glucose-Capillary: 134 mg/dL — ABNORMAL HIGH (ref 70–99)
Glucose-Capillary: 46 mg/dL — ABNORMAL LOW (ref 70–99)
Glucose-Capillary: 48 mg/dL — ABNORMAL LOW (ref 70–99)

## 2017-10-07 NOTE — Anesthesia Postprocedure Evaluation (Signed)
Anesthesia Post Note  Patient: Melanie Meyers  Procedure(s) Performed: ANTERIOR CERVICAL DECOMPRESSION/DISCECTOMY FUSION C4-7 (N/A Neck)     Patient location during evaluation: PACU Anesthesia Type: General Level of consciousness: awake and alert Pain management: pain level controlled Vital Signs Assessment: post-procedure vital signs reviewed and stable Respiratory status: spontaneous breathing, nonlabored ventilation, respiratory function stable and patient connected to nasal cannula oxygen Cardiovascular status: blood pressure returned to baseline and stable Postop Assessment: no apparent nausea or vomiting Anesthetic complications: no    Last Vitals:  Vitals:   10/07/17 0849 10/07/17 1213  BP:  (!) 127/55  Pulse:  67  Resp:  18  Temp:    SpO2: 98% 100%    Last Pain:  Vitals:   10/07/17 1516  TempSrc:   PainSc: 3    Pain Goal: Patients Stated Pain Goal: 3 (10/07/17 0649)               Haywood Lasso L Annalucia Laino

## 2017-10-07 NOTE — Progress Notes (Signed)
    Subjective: Procedure(s) (LRB): ANTERIOR CERVICAL DECOMPRESSION/DISCECTOMY FUSION C4-7 (N/A) 1 Day Post-Op  Patient reports pain as 2 on 0-10 scale.  Reports decreased arm pain reports incisional neck pain   Positive void Negative bowel movement Positive flatus Negative chest pain or shortness of breath  Objective: Vital signs in last 24 hours: Temp:  [97.7 F (36.5 C)-98.5 F (36.9 C)] 98.5 F (36.9 C) (10/03 0422) Pulse Rate:  [66-90] 66 (10/03 0422) Resp:  [10-22] 18 (10/03 0422) BP: (114-175)/(52-85) 123/65 (10/03 0422) SpO2:  [92 %-99 %] 97 % (10/03 0422)  Intake/Output from previous day: 10/02 0701 - 10/03 0700 In: 1940 [P.O.:240; I.V.:1500; IV Piggyback:200] Out: 775 [Urine:700; Blood:75]  Labs: No results for input(s): WBC, RBC, HCT, PLT in the last 72 hours. No results for input(s): NA, K, CL, CO2, BUN, CREATININE, GLUCOSE, CALCIUM in the last 72 hours. No results for input(s): LABPT, INR in the last 72 hours.  Physical Exam: Neurologically intact ABD soft Intact pulses distally Incision: dressing C/D/I Compartment soft Body mass index is 39.87 kg/m.  Assessment/Plan: Patient stable  Xrays: N/A Mobilization with physical therapy Encourage incentive spirometry Continue care  Advance diet Up with therapy  Doing well overall - radicular arm pain decreased  No swelling or issue with breathing/swallowing Plan on d/c to home today  Melina Schools, MD [facility] [phone]

## 2017-10-07 NOTE — Evaluation (Signed)
Occupational Therapy Evaluation and Discharge Patient Details Name: Melanie Meyers MRN: [id number] DOB: August 01, 1955 Today's Date: 10/07/2017    History of Present Illness Pt is a 62 y/o female who presents s/p C4-C7 ACDF on 10/06/17. PMH significant for neuropathy, HTN, fibromyalgia, DM, carpal tunnel R wrist, asthma.   Clinical Impression   PTA Pt independent in ADL and mobility. Pt is currently mod I for ADL and mobility (with SPC) in room. Pt able to complete toilet transfer, peri care, sink level grooming (implementing compensatory strategies), and LB dressing. cervical handout provided and reviewed adls in detail. Pt educated on: don/doff brace, set an alarm at night for medication, avoid sitting for long periods of time, correct bed positioning for sleeping, correct sequence for bed mobility, avoiding lifting more than 5 pounds and never wash directly over incision. All education is complete and patient indicates understanding. Thank you for the opportunity to serve this patient. OT to sign off at this time.     Follow Up Recommendations  Supervision - Intermittent    Equipment Recommendations  None recommended by OT    Recommendations for Other Services       Precautions / Restrictions Precautions Precautions: Fall;Cervical Precaution Booklet Issued: Yes (comment) Precaution Comments: Reviewed precautions verbally, and pt was cued for maintenance of precautions during functional mobility.  Required Braces or Orthoses: Cervical Brace Cervical Brace: Hard collar;Other (comment)(Per pt, MD gave permission to have brace off in bed/chair) Restrictions Weight Bearing Restrictions: No      Mobility Bed Mobility               General bed mobility comments: Pt sitting up in recliner when OT arrived.   Transfers Overall transfer level: Needs assistance Equipment used: None Transfers: Sit to/from Stand Sit to Stand: Supervision         General transfer comment:  Close supervision for in room mobility- Pt using furniture for stability in tight room    Balance Overall balance assessment: Needs assistance Sitting-balance support: Feet supported;No upper extremity supported Sitting balance-Leahy Scale: Good       Standing balance-Leahy Scale: Poor Standing balance comment: Reliant on at least 1 UE support.    Single Leg Stance - Left Leg: 1                       ADL either performed or assessed with clinical judgement   ADL Overall ADL's : Modified independent                                       General ADL Comments: Pt able to bring feet cross to knees for LB dressing, perform toilet transfer and sink level grooming implementing compensatory strategies for ADL. Pt also able to don/doff brace independently     Vision Patient Visual Report: No change from baseline Vision Assessment?: No apparent visual deficits     Perception     Praxis      Pertinent Vitals/Pain Pain Assessment: 0-10 Pain Score: 7  Faces Pain Scale: Hurts little more Pain Location: Incision site, between shoulder blades Pain Descriptors / Indicators: Operative site guarding;Aching Pain Intervention(s): Limited activity within patient's tolerance;Monitored during session;Repositioned;Patient requesting pain meds-RN notified     Hand Dominance Right   Extremity/Trunk Assessment Upper Extremity Assessment Upper Extremity Assessment: Generalized weakness(functional for grooming tasks)   Lower Extremity Assessment Lower Extremity Assessment: Defer to PT  evaluation   Cervical / Trunk Assessment Cervical / Trunk Assessment: Other exceptions Cervical / Trunk Exceptions: s/p ACDF   Communication Communication Communication: No difficulties   Cognition Arousal/Alertness: Awake/alert Behavior During Therapy: WFL for tasks assessed/performed Overall Cognitive Status: Within Functional Limits for tasks assessed                                      General Comments       Exercises     Shoulder Instructions      Home Living Family/patient expects to be discharged to:: Private residence Living Arrangements: Children Available Help at Discharge: Family;Available PRN/intermittently Type of Home: House Home Access: Ramped entrance     Home Layout: Two level;Able to live on main level with bedroom/bathroom("Playroom" downstairs)     Bathroom Shower/Tub: Teacher, early years/pre: Standard     Home Equipment: Environmental consultant - 4 wheels;Cane - single point          Prior Functioning/Environment Level of Independence: Independent                 OT Problem List:        OT Treatment/Interventions:      OT Goals(Current goals can be found in the care plan section) Acute Rehab OT Goals Patient Stated Goal: Return home with family, get back to driving OT Goal Formulation: With patient Time For Goal Achievement: 10/21/17 Potential to Achieve Goals: Good  OT Frequency:     Barriers to D/C:            Co-evaluation              AM-PAC PT "6 Clicks" Daily Activity     Outcome Measure Help from another person eating meals?: None Help from another person taking care of personal grooming?: None Help from another person toileting, which includes using toliet, bedpan, or urinal?: None Help from another person bathing (including washing, rinsing, drying)?: None Help from another person to put on and taking off regular upper body clothing?: None Help from another person to put on and taking off regular lower body clothing?: None 6 Click Score: 24   End of Session Equipment Utilized During Treatment: Cervical collar Nurse Communication: Mobility status  Activity Tolerance: Patient tolerated treatment well Patient left: with call bell/phone within reach;in chair                   Time: 8115-7262 OT Time Calculation (min): 28 min Charges:  OT General Charges $OT Visit: 1 Visit OT  Evaluation $OT Eval Moderate Complexity: 1 Mod OT Treatments $Self Care/Home Management : 8-22 mins  Hulda Humphrey OTR/L Acute Rehabilitation Services Pager: [phone] Office: [place] 10/07/2017, 11:03 AM

## 2017-10-07 NOTE — Evaluation (Signed)
Physical Therapy Evaluation Patient Details Name: Melanie Meyers MRN: [id number] DOB: 11-20-1955 Today's Date: 10/07/2017   History of Present Illness  Pt is a 62 y/o female who presents s/p C4-C7 ACDF on 10/06/17. PMH significant for neuropathy, HTN, fibromyalgia, DM, carpal tunnel R wrist, asthma.  Clinical Impression  Pt admitted with above diagnosis. Pt currently with functional limitations due to the deficits listed below (see PT Problem List). At the time of PT eval pt was able to perform transfers and ambulation with gross min guard assist progressing to overall supervision for safety by end of session. Pt was also able to progress to [facility] [address] use instead of RW. She was educated on the car transfer, safe activity progression, and general precautions. Pt will benefit from skilled PT to increase their independence and safety with mobility to allow discharge to the venue listed below.       Follow Up Recommendations No PT follow up;Supervision for mobility/OOB    Equipment Recommendations  None recommended by PT    Recommendations for Other Services       Precautions / Restrictions Precautions Precautions: Fall;Cervical Precaution Comments: Reviewed precautions verbally, and pt was cued for maintenance of precautions during functional mobility.  Required Braces or Orthoses: Cervical Brace Cervical Brace: Hard collar;Other (comment)(Per pt, MD gave permission to have brace off in bed/chair) Restrictions Weight Bearing Restrictions: No      Mobility  Bed Mobility               General bed mobility comments: Pt sitting up on EOB when PT arrived.   Transfers Overall transfer level: Needs assistance Equipment used: Rolling Cassidy (2 wheeled);Straight cane Transfers: Sit to/from Stand Sit to Stand: Supervision         General transfer comment: Close supervision with RW initially, progressing to lighter supervision by end of session with SPC.    Ambulation/Gait Ambulation/Gait assistance: Min guard;Supervision Gait Distance (Feet): 300 Feet Assistive device: Rolling Rohrbach (2 wheeled);Straight cane Gait Pattern/deviations: Step-through pattern;Decreased stride length;Trunk flexed Gait velocity: Decreased Gait velocity interpretation: 1.31 - 2.62 ft/sec, indicative of limited community ambulator General Gait Details: VC's for improved posture. Pt was initially with RW however progressed to [facility] by end of gait training. Pt appeared more comfortable with the cane and did not observe any LOB.   Stairs            Wheelchair Mobility    Modified Rankin (Stroke Patients Only)       Balance Overall balance assessment: Needs assistance Sitting-balance support: Feet supported;No upper extremity supported Sitting balance-Leahy Scale: Good       Standing balance-Leahy Scale: Poor Standing balance comment: Reliant on at least 1 UE support.    Single Leg Stance - Left Leg: 1                         Pertinent Vitals/Pain Pain Assessment: Faces Faces Pain Scale: Hurts little more Pain Location: Incision site, between shoulder blades Pain Descriptors / Indicators: Operative site guarding;Aching Pain Intervention(s): Limited activity within patient's tolerance;Monitored during session;Repositioned    Home Living Family/patient expects to be discharged to:: Private residence Living Arrangements: Children Available Help at Discharge: Family;Available PRN/intermittently Type of Home: House Home Access: Ramped entrance     Home Layout: Two level;Able to live on main level with bedroom/bathroom("Playroom" downstairs) Home Equipment: [place] - 4 wheels;Cane - single point      Prior Function Level of Independence: Independent  Hand Dominance        Extremity/Trunk Assessment   Upper Extremity Assessment Upper Extremity Assessment: Defer to OT evaluation    Lower Extremity  Assessment Lower Extremity Assessment: Generalized weakness(Due to low activity tolerance)    Cervical / Trunk Assessment Cervical / Trunk Assessment: Other exceptions Cervical / Trunk Exceptions: s/p ACDF  Communication   Communication: No difficulties  Cognition Arousal/Alertness: Awake/alert Behavior During Therapy: WFL for tasks assessed/performed Overall Cognitive Status: Within Functional Limits for tasks assessed                                        General Comments      Exercises     Assessment/Plan    PT Assessment Patient needs continued PT services  PT Problem List Decreased strength;Decreased range of motion;Decreased activity tolerance;Decreased balance;Decreased mobility;Decreased knowledge of use of DME;Decreased safety awareness;Decreased knowledge of precautions;Pain       PT Treatment Interventions DME instruction;Gait training;Stair training;Functional mobility training;Therapeutic activities;Therapeutic exercise;Neuromuscular re-education;Patient/family education    PT Goals (Current goals can be found in the Care Plan section)  Acute Rehab PT Goals Patient Stated Goal: Return home with family, get back to driving PT Goal Formulation: With patient Time For Goal Achievement: 10/14/17 Potential to Achieve Goals: Good    Frequency Min 5X/week   Barriers to discharge        Co-evaluation               AM-PAC PT "6 Clicks" Daily Activity  Outcome Measure Difficulty turning over in bed (including adjusting bedclothes, sheets and blankets)?: None Difficulty moving from lying on back to sitting on the side of the bed? : A Little Difficulty sitting down on and standing up from a chair with arms (e.g., wheelchair, bedside commode, etc,.)?: A Little Help needed moving to and from a bed to chair (including a wheelchair)?: A Little Help needed walking in hospital room?: A Little Help needed climbing 3-5 steps with a railing? : A  Little 6 Click Score: 19    End of Session Equipment Utilized During Treatment: Gait belt;Cervical collar Activity Tolerance: Patient tolerated treatment well Patient left: in chair;with call bell/phone within reach Nurse Communication: Mobility status PT Visit Diagnosis: Unsteadiness on feet (R26.81);Pain;Difficulty in walking, not elsewhere classified (R26.2) Pain - part of body: (Neck/back)    Time: 1324-4010 PT Time Calculation (min) (ACUTE ONLY): 23 min   Charges:   PT Evaluation $PT Eval Moderate Complexity: 1 Mod PT Treatments $Gait Training: 8-22 mins        Rolinda Roan, PT, DPT Acute Rehabilitation Services Pager: [phone] Office: [phone]   Thelma Comp 10/07/2017, 9:40 AM

## 2017-10-07 NOTE — Progress Notes (Signed)
Patient is discharged from room 3C07 at this time. Alert and in stable condition. IV site d/c'd and instructions read to patient with understanding verbalized. Left unit via wheelchair with all belongings at side. 

## 2017-10-07 NOTE — Progress Notes (Signed)
Hypoglycemic Event  CBG:48  Treatment: 15 GM carbohydrate snack  Symptoms: None  Follow-up CBG: Time:1242 CBG Result:83  Possible Reasons for Event: Unknown  Comments/MD notified:Hypoglycemic protocol.    Tom-Johnson, Renea Ee

## 2017-10-11 NOTE — Discharge Summary (Signed)
Physician Discharge Summary  Patient ID: Jonita Hirota MRN: [id number] DOB/AGE: 1955/07/18 62 y.o.  Admit date: 10/06/2017 Discharge date: 10/07/17  Admission Diagnoses:  Cervical DDD  Discharge Diagnoses:  Active Problems:   S/P cervical spinal fusion   Past Medical History:  Diagnosis Date  . Asthma   . B12 deficiency   . Back pain   . Carpal tunnel syndrome of right wrist   . Degenerative arthritis    degenerative arthritis of neck  . Depression   . Diabetes mellitus without complication ([place])   . Fibromyalgia   . GERD (gastroesophageal reflux disease)   . High cholesterol   . Hypertension   . Hypothyroid   . Insomnia   . Migraine   . Migraines   . Neuropathy   . Neuropathy   . OSA on CPAP   . Sarcoidosis   . Sleep apnea   . Thyroid disease   . Vitamin D deficiency     Surgeries: Procedure(s): ANTERIOR CERVICAL DECOMPRESSION/DISCECTOMY FUSION C4-7 on 10/06/2017   Consultants (if any):   Discharged Condition: Improved  Hospital Course: Jolaine Fryberger is an 62 y.o. female who was admitted 10/06/2017 with a diagnosis of Cervical DDD and went to the operating room on 10/06/2017 and underwent the above named procedures.  Post op day one pt reports low level of pain controlled on oral medication.  Pt reports decreased arm pain.  Pt is urinating w/o difficulty.  Pt is ambulating in hallway as well as PT.  Pt is cleared by PT for DC. Pt is urinating w/o difficulty.   She was given perioperative antibiotics:  Anti-infectives (From admission, onward)   Start     Dose/Rate Route Frequency Ordered Stop   10/06/17 1530  ceFAZolin (ANCEF) IVPB 2g/100 mL premix     2 g 200 mL/hr over 30 Minutes Intravenous Every 8 hours 10/06/17 1521 10/06/17 2344   10/06/17 0632  ceFAZolin (ANCEF) 2-4 GM/100ML-% IVPB    Note to Pharmacy:  Starleen Arms   : cabinet override      10/06/17 0632 10/06/17 0855   10/06/17 0629  ceFAZolin (ANCEF) IVPB 2g/100 mL premix     2  g 200 mL/hr over 30 Minutes Intravenous 30 min pre-op 10/06/17 0629 10/06/17 1245    .  She was given sequential compression devices, early ambulation, and TED for DVT prophylaxis.  She benefited maximally from the hospital stay and there were no complications.    Recent vital signs:  Vitals:   10/07/17 0849 10/07/17 1213  BP:  (!) 127/55  Pulse:  67  Resp:  18  Temp:    SpO2: 98% 100%    Recent laboratory studies:  Lab Results  Component Value Date   HGB 12.5 09/28/2017   HGB 12.0 09/21/2017   HGB 12.1 09/20/2015   Lab Results  Component Value Date   WBC 9.5 09/28/2017   PLT 280 09/28/2017   Lab Results  Component Value Date   INR 0.96 07/25/2015   Lab Results  Component Value Date   NA 139 09/28/2017   K 4.4 09/28/2017   CL 104 09/28/2017   CO2 24 09/28/2017   BUN 22 09/28/2017   CREATININE 0.97 09/28/2017   GLUCOSE 91 09/28/2017    Discharge Medications:   Allergies as of 10/07/2017      Reactions   Accupril [quinapril Hcl] Cough   Prednisone Other (See Comments)   Has to be cautious due to Blood Sugar   Levaquin [levofloxacin]  Rash      Medication List    STOP taking these medications   acetaminophen 500 MG tablet Commonly known as:  TYLENOL   amoxicillin 500 MG capsule Commonly known as:  AMOXIL   meloxicam 15 MG tablet Commonly known as:  MOBIC     TAKE these medications   ACCU-CHEK AVIVA PLUS test strip Generic drug:  glucose blood 1 each by Other route as needed (for BS).   ANTIOXIDANT Caps Take 1 capsule by mouth at bedtime.   aspirin EC 81 MG tablet Take 81 mg by mouth at bedtime.   BIOFREEZE EX Apply 1 application topically daily as needed (muscle pain).   Biotin 10000 MCG Tabs Take 10,000 mcg by mouth at bedtime.   budesonide-formoterol 160-4.5 MCG/ACT inhaler Commonly known as:  SYMBICORT Inhale two puffs twice daily with spacer to prevent cough or wheeze.  Rinse, gargle, and spit after use.   cetirizine 10 MG  tablet Commonly known as:  ZYRTEC Take 10 mg by mouth at bedtime.   co-enzyme Q-10 30 MG capsule Take 30 mg by mouth at bedtime.   Fish Oil 1000 MG Caps Take 2,000 mg by mouth at bedtime.   HM POTASSIUM 595 (99 K) MG Tabs tablet Generic drug:  potassium gluconate Take 595 mg by mouth daily as needed (cramps).   levothyroxine 25 MCG tablet Commonly known as:  SYNTHROID, LEVOTHROID Take 25 mcg by mouth daily before breakfast.   loratadine 10 MG tablet Commonly known as:  CLARITIN Take 10 mg by mouth at bedtime.   losartan 25 MG tablet Commonly known as:  COZAAR Take 25 mg by mouth at bedtime.   Magnesium 250 MG Tabs Take 250 mg by mouth at bedtime.   metFORMIN 1000 MG tablet Commonly known as:  GLUCOPHAGE Take 1 tablet (1,000 mg total) by mouth 2 (two) times daily with a meal.   methocarbamol 500 MG tablet Commonly known as:  ROBAXIN Take 1 tablet (500 mg total) by mouth 3 (three) times daily. What changed:  when to take this   mometasone 50 MCG/ACT nasal spray Commonly known as:  NASONEX Place 1 spray into the nose at bedtime.   montelukast 10 MG tablet Commonly known as:  SINGULAIR Take 10 mg by mouth at bedtime.   multivitamin with minerals Tabs tablet Take 1 tablet by mouth at bedtime.   nitroGLYCERIN 0.4 MG SL tablet Commonly known as:  NITROSTAT Place 1 tablet (0.4 mg total) under the tongue every 5 (five) minutes as needed. What changed:  reasons to take this   ondansetron 4 MG disintegrating tablet Commonly known as:  ZOFRAN-ODT Take 1 tablet (4 mg total) by mouth every 8 (eight) hours as needed.   oxyCODONE-acetaminophen 10-325 MG tablet Commonly known as:  PERCOCET Take 1 tablet by mouth every 4 (four) hours as needed for up to 5 days for pain.   polyethylene glycol packet Commonly known as:  MIRALAX / GLYCOLAX Take 17 g by mouth daily as needed for mild constipation.   pravastatin 40 MG tablet Commonly known as:  PRAVACHOL Take 40 mg by  mouth daily.   pregabalin 150 MG capsule Commonly known as:  LYRICA Take  1 cap in AM, 2 caps in PM and continue What changed:    how much to take  how to take this  when to take this  additional instructions   ranitidine 300 MG tablet Commonly known as:  ZANTAC TAKE 1 TABLET AT BEDTIME   rizatriptan 10  MG tablet Commonly known as:  MAXALT Take 10 mg by mouth as needed for migraine.   sertraline 50 MG tablet Commonly known as:  ZOLOFT Take 50 mg by mouth at bedtime.   SOLIQUA 100-33 UNT-MCG/ML Sopn Generic drug:  Insulin Glargine-Lixisenatide Inject 30 Units into the skin daily.   UNIFINE PENTIPS 31G X 6 MM Misc Generic drug:  Insulin Pen Needle   VENTOLIN HFA 108 (90 Base) MCG/ACT inhaler Generic drug:  albuterol Inhale 2 puffs into the lungs every 6 (six) hours as needed for wheezing or shortness of breath.   Vitamin D3 5000 units Caps Take 5,000 Units by mouth at bedtime.   zolpidem 10 MG tablet Commonly known as:  AMBIEN Take 10 mg by mouth at bedtime.       Diagnostic Studies: Dg Cervical Spine 2-3 Views  Result Date: 10/06/2017 CLINICAL DATA:  Status post surgical anterior fusion from C4-C7. EXAM: DG C-ARM 61-120 MIN; CERVICAL SPINE - 2-3 VIEW FLUOROSCOPY TIME:  1 minutes 5 seconds. COMPARISON:  MRI of June 07, 2017. FINDINGS: Three intraoperative fluoroscopic images demonstrate the patient be status post surgical anterior fusion of C4-5, C5-6 and C6-7. Good alignment of vertebral bodies is noted. IMPRESSION: Status post surgical anterior fusion of C4-5 C5-6 and C6-7. Electronically Signed   By: Marijo Conception, M.D.   On: 10/06/2017 13:21   Dg C-arm 1-60 Min  Result Date: 10/06/2017 CLINICAL DATA:  Status post surgical anterior fusion from C4-C7. EXAM: DG C-ARM 61-120 MIN; CERVICAL SPINE - 2-3 VIEW FLUOROSCOPY TIME:  1 minutes 5 seconds. COMPARISON:  MRI of June 07, 2017. FINDINGS: Three intraoperative fluoroscopic images demonstrate the patient be status  post surgical anterior fusion of C4-5, C5-6 and C6-7. Good alignment of vertebral bodies is noted. IMPRESSION: Status post surgical anterior fusion of C4-5 C5-6 and C6-7. Electronically Signed   By: Marijo Conception, M.D.   On: 10/06/2017 13:21   Dg C-arm 1-60 Min  Result Date: 10/06/2017 CLINICAL DATA:  Status post surgical anterior fusion from C4-C7. EXAM: DG C-ARM 61-120 MIN; CERVICAL SPINE - 2-3 VIEW FLUOROSCOPY TIME:  1 minutes 5 seconds. COMPARISON:  MRI of June 07, 2017. FINDINGS: Three intraoperative fluoroscopic images demonstrate the patient be status post surgical anterior fusion of C4-5, C5-6 and C6-7. Good alignment of vertebral bodies is noted. IMPRESSION: Status post surgical anterior fusion of C4-5 C5-6 and C6-7. Electronically Signed   By: Marijo Conception, M.D.   On: 10/06/2017 13:21   Dg C-arm 1-60 Min  Result Date: 10/06/2017 CLINICAL DATA:  Status post surgical anterior fusion from C4-C7. EXAM: DG C-ARM 61-120 MIN; CERVICAL SPINE - 2-3 VIEW FLUOROSCOPY TIME:  1 minutes 5 seconds. COMPARISON:  MRI of June 07, 2017. FINDINGS: Three intraoperative fluoroscopic images demonstrate the patient be status post surgical anterior fusion of C4-5, C5-6 and C6-7. Good alignment of vertebral bodies is noted. IMPRESSION: Status post surgical anterior fusion of C4-5 C5-6 and C6-7. Electronically Signed   By: Marijo Conception, M.D.   On: 10/06/2017 13:21    Disposition:  Post op meds provided Pt will present to clinic in 2 weeks Discharge Instructions    Incentive spirometry RT   Complete by:  As directed       Follow-up Information    Melina Schools, MD Follow up in 2 week(s).   Specialty:  Orthopedic Surgery Contact information: [address] [place] [address] [phone]  Signed: Valinda Hoar 10/11/2017, 2:42 PM

## 2017-10-12 DIAGNOSIS — Z981 Arthrodesis status: Secondary | ICD-10-CM | POA: Diagnosis not present

## 2017-10-14 ENCOUNTER — Telehealth: Payer: Self-pay | Admitting: Sports Medicine

## 2017-10-14 NOTE — Telephone Encounter (Signed)
Called and schedule pt

## 2017-10-14 NOTE — Telephone Encounter (Signed)
Pt left a message asking about her diabetic shoes.Can you please call pt and advise.

## 2017-10-19 DIAGNOSIS — Z4889 Encounter for other specified surgical aftercare: Secondary | ICD-10-CM | POA: Diagnosis not present

## 2017-10-19 DIAGNOSIS — M5030 Other cervical disc degeneration, unspecified cervical region: Secondary | ICD-10-CM | POA: Diagnosis not present

## 2017-10-20 ENCOUNTER — Ambulatory Visit: Payer: Self-pay | Admitting: Cardiology

## 2017-10-27 DIAGNOSIS — G4733 Obstructive sleep apnea (adult) (pediatric): Secondary | ICD-10-CM | POA: Diagnosis not present

## 2017-10-28 DIAGNOSIS — M542 Cervicalgia: Secondary | ICD-10-CM | POA: Diagnosis not present

## 2017-10-31 DIAGNOSIS — M549 Dorsalgia, unspecified: Secondary | ICD-10-CM | POA: Diagnosis not present

## 2017-10-31 DIAGNOSIS — E1149 Type 2 diabetes mellitus with other diabetic neurological complication: Secondary | ICD-10-CM | POA: Diagnosis not present

## 2017-10-31 DIAGNOSIS — G5600 Carpal tunnel syndrome, unspecified upper limb: Secondary | ICD-10-CM | POA: Diagnosis not present

## 2017-10-31 DIAGNOSIS — D860 Sarcoidosis of lung: Secondary | ICD-10-CM | POA: Diagnosis not present

## 2017-10-31 DIAGNOSIS — G629 Polyneuropathy, unspecified: Secondary | ICD-10-CM | POA: Diagnosis not present

## 2017-11-08 ENCOUNTER — Ambulatory Visit: Payer: Medicare HMO | Admitting: Cardiology

## 2017-11-10 ENCOUNTER — Ambulatory Visit (INDEPENDENT_AMBULATORY_CARE_PROVIDER_SITE_OTHER): Payer: Medicare HMO | Admitting: Cardiology

## 2017-11-10 ENCOUNTER — Encounter: Payer: Self-pay | Admitting: Sports Medicine

## 2017-11-10 ENCOUNTER — Encounter: Payer: Self-pay | Admitting: Cardiology

## 2017-11-10 ENCOUNTER — Ambulatory Visit (INDEPENDENT_AMBULATORY_CARE_PROVIDER_SITE_OTHER): Payer: Medicare HMO | Admitting: Sports Medicine

## 2017-11-10 VITALS — BP 122/70 | HR 76 | Ht 60.0 in | Wt 206.0 lb

## 2017-11-10 DIAGNOSIS — E1169 Type 2 diabetes mellitus with other specified complication: Secondary | ICD-10-CM

## 2017-11-10 DIAGNOSIS — I10 Essential (primary) hypertension: Secondary | ICD-10-CM | POA: Diagnosis not present

## 2017-11-10 DIAGNOSIS — G63 Polyneuropathy in diseases classified elsewhere: Secondary | ICD-10-CM

## 2017-11-10 DIAGNOSIS — M2021 Hallux rigidus, right foot: Secondary | ICD-10-CM

## 2017-11-10 DIAGNOSIS — I2510 Atherosclerotic heart disease of native coronary artery without angina pectoris: Secondary | ICD-10-CM | POA: Insufficient documentation

## 2017-11-10 DIAGNOSIS — E088 Diabetes mellitus due to underlying condition with unspecified complications: Secondary | ICD-10-CM | POA: Diagnosis not present

## 2017-11-10 DIAGNOSIS — E669 Obesity, unspecified: Secondary | ICD-10-CM

## 2017-11-10 DIAGNOSIS — M2041 Other hammer toe(s) (acquired), right foot: Secondary | ICD-10-CM

## 2017-11-10 DIAGNOSIS — E349 Endocrine disorder, unspecified: Secondary | ICD-10-CM | POA: Diagnosis not present

## 2017-11-10 DIAGNOSIS — M205X1 Other deformities of toe(s) (acquired), right foot: Secondary | ICD-10-CM

## 2017-11-10 HISTORY — DX: Atherosclerotic heart disease of native coronary artery without angina pectoris: I25.10

## 2017-11-10 NOTE — Progress Notes (Signed)
Patient ID: Melanie Meyers, female   DOB: February 06, 1955, 62 y.o.   MRN: [id number]   Patient presents for diabetic shoe pick up, shoes are tried on for good fit.  Patient received 1 pair [employer] Women - [employer]/Purple [phone] in 8 wide and 3 pairs custom molded diabetic inserts.  Verbal and written break in and wear instructions given.

## 2017-11-10 NOTE — Progress Notes (Signed)
Patient discussed with medical assistant. Agree with her note. Patient to follow up as scheduled for continued care or sooner if problems or issues arise. -Dr. Olney Monier  

## 2017-11-10 NOTE — Patient Instructions (Signed)

## 2017-11-10 NOTE — Patient Instructions (Signed)
Medication Instructions:  Your physician recommends that you continue on your current medications as directed. Please refer to the Current Medication list given to you today.  If you need a refill on your cardiac medications before your next appointment, please call your pharmacy.   Lab work: Your physician recommends that you have the following labs drawn: TSH, BMP, liver and lipid panel in 1 week. No appointment is needed for labs, please come fasting.  If you have labs (blood work) drawn today and your tests are completely normal, you will receive your results only by: Marland Kitchen MyChart Message (if you have MyChart) OR . A paper copy in the mail If you have any lab test that is abnormal or we need to change your treatment, we will call you to review the results.  Testing/Procedures: None  Follow-Up: At [facility], you and your health needs are our priority.  As part of our continuing mission to provide you with exceptional heart care, we have created designated Provider Care Teams.  These Care Teams include your primary Cardiologist (physician) and Advanced Practice Providers (APPs -  Physician Assistants and Nurse Practitioners) who all work together to provide you with the care you need, when you need it.  You will need a follow up appointment in 6 months.  Please call our office 2 months in advance to schedule this appointment.  You may see another member of our [employer] Provider Team in : Jenne Campus, MD . Shirlee More, MD  Any Other Special Instructions Will Be Listed Below (If Applicable).

## 2017-11-10 NOTE — Progress Notes (Signed)
Cardiology Office Note:    Date:  11/10/2017   ID:  Melanie Meyers, DOB September 25, 1955, MRN [id number]  PCP:  Janine Limbo, PA-C  Cardiologist:  Jenean Lindau, MD   Referring MD: Janine Limbo, PA-C    ASSESSMENT:    1. Coronary artery disease involving native coronary artery of native heart without angina pectoris   2. Essential hypertension   3. Diabetes mellitus due to underlying condition with unspecified complications ([place])   4. Morbid obesity ([place])    PLAN:    In order of problems listed above:  1. Secondary prevention stressed with the patient.  Importance of compliance with diet and medication stressed and she vocalized understanding.  Her blood pressure is stable.  Diet was discussed with dyslipidemia and diabetes mellitus.  Risks of obesity explained and she vocalized understanding. 2. Coronary angiography report was detailed to the patient at length.  Questions were answered to her satisfaction.  She will have fasting blood work today.  I will change her medication from pravastatin to rosuvastatin based on her lipid numbers. 3. Patient will be seen in follow-up appointment in 6 months or earlier if the patient has any concerns    Medication Adjustments/Labs and Tests Ordered: Current medicines are reviewed at length with the patient today.  Concerns regarding medicines are outlined above.  No orders of the defined types were placed in this encounter.  No orders of the defined types were placed in this encounter.    No chief complaint on file.    History of Present Illness:    Melanie Meyers is a 62 y.o. female has known coronary artery disease.  She was seen for preop assessment significant symptoms.  Because of multiple risk factors for coronary artery disease and a sedentary lifestyle I sent her for coronary angiography which revealed nonobstructive coronary artery stenosis.  She is undergone the surgery successful and denies any problems at this  time.  No chest pain orthopnea or PND.  At the time of my evaluation, the patient is alert awake oriented and in no distress.  Past Medical History:  Diagnosis Date  . Asthma   . B12 deficiency   . Back pain   . Carpal tunnel syndrome of right wrist   . Degenerative arthritis    degenerative arthritis of neck  . Depression   . Diabetes mellitus without complication ([place])   . Fibromyalgia   . GERD (gastroesophageal reflux disease)   . High cholesterol   . Hypertension   . Hypothyroid   . Insomnia   . Migraine   . Migraines   . Neuropathy   . Neuropathy   . OSA on CPAP   . Sarcoidosis   . Sleep apnea   . Thyroid disease   . Vitamin D deficiency     Past Surgical History:  Procedure Laterality Date  . ANTERIOR CERVICAL DECOMP/DISCECTOMY FUSION N/A 10/06/2017   Procedure: ANTERIOR CERVICAL DECOMPRESSION/DISCECTOMY FUSION C4-7;  Surgeon: Melina Schools, MD;  Location: [facility];  Service: Orthopedics;  Laterality: N/A;  4.5 hrs  . BREAST REDUCTION SURGERY  02/18/2015  . CARPAL TUNNEL RELEASE  2014  . CARPAL TUNNEL RELEASE  04/2016  . [place]  . LEFT HEART CATH AND CORONARY ANGIOGRAPHY N/A 09/29/2017   Procedure: LEFT HEART CATH AND CORONARY ANGIOGRAPHY;  Surgeon: Martinique, Peter M, MD;  Location: [facility];  Service: Cardiovascular;  Laterality: N/A;  . NASAL POLYP SURGERY  1983  . SINOSCOPY  10/09/2014  . TONSILLECTOMY  1978  . TUBAL LIGATION      Current Medications: Current Meds  Medication Sig  . ACCU-CHEK AVIVA PLUS test strip 1 each by Other route as needed (for BS).   Marland Kitchen albuterol (VENTOLIN HFA) 108 (90 Base) MCG/ACT inhaler Inhale 2 puffs into the lungs every 6 (six) hours as needed for wheezing or shortness of breath.   Marland Kitchen aspirin EC 81 MG tablet Take 81 mg by mouth at bedtime.   . Biotin 10000 MCG TABS Take 10,000 mcg by mouth at bedtime.  . budesonide-formoterol (SYMBICORT) 160-4.5 MCG/ACT inhaler Inhale two puffs twice daily with  spacer to prevent cough or wheeze.  Rinse, gargle, and spit after use.  . cetirizine (ZYRTEC) 10 MG tablet Take 10 mg by mouth at bedtime.   Marland Kitchen co-enzyme Q-10 30 MG capsule Take 30 mg by mouth at bedtime.  . Insulin Glargine-Lixisenatide (SOLIQUA) 100-33 UNT-MCG/ML SOPN Inject 30 Units into the skin daily.  Marland Kitchen levothyroxine (SYNTHROID, LEVOTHROID) 25 MCG tablet Take 25 mcg by mouth daily before breakfast.  . loratadine (CLARITIN) 10 MG tablet Take 10 mg by mouth at bedtime.   Marland Kitchen losartan (COZAAR) 25 MG tablet Take 25 mg by mouth at bedtime.   . Magnesium 250 MG TABS Take 250 mg by mouth at bedtime.  . Menthol, Topical Analgesic, (BIOFREEZE EX) Apply 1 application topically daily as needed (muscle pain).  . metFORMIN (GLUCOPHAGE) 1000 MG tablet Take 1 tablet (1,000 mg total) by mouth 2 (two) times daily with a meal.  . methocarbamol (ROBAXIN) 500 MG tablet Take 1 tablet (500 mg total) by mouth 3 (three) times daily.  . mometasone (NASONEX) 50 MCG/ACT nasal spray Place 1 spray into the nose at bedtime.  . montelukast (SINGULAIR) 10 MG tablet Take 10 mg by mouth at bedtime.   . Multiple Vitamin (MULTIVITAMIN WITH MINERALS) TABS tablet Take 1 tablet by mouth at bedtime.  . Multiple Vitamins-Minerals (ANTIOXIDANT) CAPS Take 1 capsule by mouth at bedtime.  . nitroGLYCERIN (NITROSTAT) 0.4 MG SL tablet Place 1 tablet (0.4 mg total) under the tongue every 5 (five) minutes as needed. (Patient taking differently: Place 0.4 mg under the tongue every 5 (five) minutes as needed for chest pain. )  . Omega-3 Fatty Acids (FISH OIL) 1000 MG CAPS Take 2,000 mg by mouth at bedtime.  . ondansetron (ZOFRAN ODT) 4 MG disintegrating tablet Take 1 tablet (4 mg total) by mouth every 8 (eight) hours as needed.  . polyethylene glycol (MIRALAX / GLYCOLAX) packet Take 17 g by mouth daily as needed for mild constipation.   . potassium gluconate (HM POTASSIUM) 595 (99 K) MG TABS tablet Take 595 mg by mouth daily as needed (cramps).   . pravastatin (PRAVACHOL) 40 MG tablet Take 40 mg by mouth daily.   . pregabalin (LYRICA) 150 MG capsule Take  1 cap in AM, 2 caps in PM and continue (Patient taking differently: Take 150 mg by mouth 2 (two) times daily. )  . ranitidine (ZANTAC) 300 MG tablet TAKE 1 TABLET AT BEDTIME (Patient taking differently: Take 300 mg by mouth at bedtime. )  . rizatriptan (MAXALT) 10 MG tablet Take 10 mg by mouth as needed for migraine.   . sertraline (ZOLOFT) 50 MG tablet Take 50 mg by mouth at bedtime.  Marland Kitchen UNIFINE PENTIPS 31G X 6 MM MISC   . zolpidem (AMBIEN) 10 MG tablet Take 10 mg by mouth at bedtime.    Current Facility-Administered Medications for the 11/10/17  encounter (Office Visit) with Jony Ladnier, Reita Cliche, MD  Medication  . triamcinolone acetonide (KENALOG) 10 MG/ML injection 10 mg  . triamcinolone acetonide (KENALOG) 10 MG/ML injection 10 mg     Allergies:   Accupril [quinapril hcl]; Prednisone; and Levaquin [levofloxacin]   Social History   Socioeconomic History  . Marital status: Widowed    Spouse name: Not on file  . Number of children: Not on file  . Years of education: Not on file  . Highest education level: Not on file  Occupational History  . Not on file  Social Needs  . Financial resource strain: Not on file  . Food insecurity:    Worry: Not on file    Inability: Not on file  . Transportation needs:    Medical: Not on file    Non-medical: Not on file  Tobacco Use  . Smoking status: Never Smoker  . Smokeless tobacco: Never Used  Substance and Sexual Activity  . Alcohol use: No    Alcohol/week: 0.0 standard drinks  . Drug use: No  . Sexual activity: Not on file  Lifestyle  . Physical activity:    Days per week: Not on file    Minutes per session: Not on file  . Stress: Not on file  Relationships  . Social connections:    Talks on phone: Not on file    Gets together: Not on file    Attends religious service: Not on file    Active member of club or organization:  Not on file    Attends meetings of clubs or organizations: Not on file    Relationship status: Not on file  Other Topics Concern  . Not on file  Social History Narrative  . Not on file     Family History: The patient's family history includes Alzheimer's disease in her mother; Diabetes in her mother; Gout in her mother; Heart attack in her father and sister; High Cholesterol in her father; High blood pressure in her father; Parkinson's disease in her mother; Thyroid disease in her sister, sister, and sister.  ROS:   Please see the history of present illness.    All other systems reviewed and are negative.  EKGs/Labs/Other Studies Reviewed:    The following studies were reviewed today: I discussed my findings with the patient at extensive length.   Recent Labs: 09/28/2017: BUN 22; Creatinine, Ser 0.97; Hemoglobin 12.5; Platelets 280; Potassium 4.4; Sodium 139  Recent Lipid Panel No results found for: CHOL, TRIG, HDL, CHOLHDL, VLDL, LDLCALC, LDLDIRECT  Physical Exam:    VS:  BP 122/70 (BP Location: Right Arm, Patient Position: Sitting, Cuff Size: Normal)   Pulse 76   Ht 5' (1.524 m)   Wt 206 lb (93.4 kg)   LMP  (LMP Unknown) Comment: tubal ligation  SpO2 99%   BMI 40.23 kg/m     Wt Readings from Last 3 Encounters:  11/10/17 206 lb (93.4 kg)  10/06/17 204 lb 2.3 oz (92.6 kg)  09/29/17 209 lb (94.8 kg)     GEN: Patient is in no acute distress HEENT: Normal NECK: No JVD; No carotid bruits LYMPHATICS: No lymphadenopathy CARDIAC: Hear sounds regular, 2/6 systolic murmur at the apex. RESPIRATORY:  Clear to auscultation without rales, wheezing or rhonchi  ABDOMEN: Soft, non-tender, non-distended MUSCULOSKELETAL:  No edema; No deformity  SKIN: Warm and dry NEUROLOGIC:  Alert and oriented x 3 PSYCHIATRIC:  Normal affect   Signed, Jenean Lindau, MD  11/10/2017 10:19 AM  [facility]

## 2017-11-15 DIAGNOSIS — Z4889 Encounter for other specified surgical aftercare: Secondary | ICD-10-CM | POA: Diagnosis not present

## 2017-11-15 DIAGNOSIS — Z4789 Encounter for other orthopedic aftercare: Secondary | ICD-10-CM | POA: Diagnosis not present

## 2017-11-19 ENCOUNTER — Telehealth: Payer: Self-pay | Admitting: Cardiology

## 2017-11-19 DIAGNOSIS — Z681 Body mass index (BMI) 19 or less, adult: Secondary | ICD-10-CM | POA: Diagnosis not present

## 2017-11-19 DIAGNOSIS — E669 Obesity, unspecified: Secondary | ICD-10-CM | POA: Diagnosis not present

## 2017-11-19 DIAGNOSIS — Z0000 Encounter for general adult medical examination without abnormal findings: Secondary | ICD-10-CM | POA: Diagnosis not present

## 2017-11-19 DIAGNOSIS — E785 Hyperlipidemia, unspecified: Secondary | ICD-10-CM | POA: Diagnosis not present

## 2017-11-19 DIAGNOSIS — Z1331 Encounter for screening for depression: Secondary | ICD-10-CM | POA: Diagnosis not present

## 2017-11-19 NOTE — Telephone Encounter (Signed)
Calling back about a medicine she was supposed to check on

## 2017-11-19 NOTE — Telephone Encounter (Signed)
Per the patient you recommended she check with her pharmacy as to which meds her insurance would cover as an alternative to her losartan. According to the insurance and pharmacy it was to many to fax. The patient requests that you please choose an alternative and hopefully it should be covered.

## 2017-11-19 NOTE — Progress Notes (Signed)
Patient ID: Melanie Meyers, female   DOB: November 22, 1955, 62 y.o.   MRN: [id number]   Patient presents at Dr Leeanne Rio request to be measured for diabetic shoes and inserts with [facility] Certified Pedorthist.  Patient will be called when shoes and inserts arrive to schedule a fitting.

## 2017-11-22 ENCOUNTER — Other Ambulatory Visit: Payer: Self-pay

## 2017-11-22 DIAGNOSIS — I10 Essential (primary) hypertension: Secondary | ICD-10-CM

## 2017-11-22 MED ORDER — OLMESARTAN MEDOXOMIL 20 MG PO TABS: 10.0000 mg | ORAL_TABLET | Freq: Every day | ORAL | 0 refills | Status: DC

## 2017-11-22 NOTE — Telephone Encounter (Signed)
olmesartan start with low dose and check p bp and bmp in a week

## 2017-11-22 NOTE — Telephone Encounter (Signed)
Patient was started on olmesartan 10 mg daily, order has been placed. Per the patient she will start tomorrow. Appointment has been set to come in next week for BMP, BP and pulse.

## 2017-11-30 ENCOUNTER — Ambulatory Visit: Payer: Medicare HMO

## 2017-12-01 DIAGNOSIS — G629 Polyneuropathy, unspecified: Secondary | ICD-10-CM | POA: Diagnosis not present

## 2017-12-01 DIAGNOSIS — D860 Sarcoidosis of lung: Secondary | ICD-10-CM | POA: Diagnosis not present

## 2017-12-01 DIAGNOSIS — G5600 Carpal tunnel syndrome, unspecified upper limb: Secondary | ICD-10-CM | POA: Diagnosis not present

## 2017-12-01 DIAGNOSIS — E1149 Type 2 diabetes mellitus with other diabetic neurological complication: Secondary | ICD-10-CM | POA: Diagnosis not present

## 2017-12-01 DIAGNOSIS — M549 Dorsalgia, unspecified: Secondary | ICD-10-CM | POA: Diagnosis not present

## 2017-12-06 ENCOUNTER — Ambulatory Visit (INDEPENDENT_AMBULATORY_CARE_PROVIDER_SITE_OTHER): Payer: Medicare HMO | Admitting: Cardiology

## 2017-12-06 ENCOUNTER — Ambulatory Visit: Payer: Medicare HMO

## 2017-12-06 VITALS — BP 120/78 | HR 69 | Resp 18 | Ht 60.0 in | Wt 212.0 lb

## 2017-12-06 DIAGNOSIS — I10 Essential (primary) hypertension: Secondary | ICD-10-CM | POA: Diagnosis not present

## 2017-12-06 DIAGNOSIS — I2510 Atherosclerotic heart disease of native coronary artery without angina pectoris: Secondary | ICD-10-CM | POA: Diagnosis not present

## 2017-12-06 LAB — LIPID PANEL
CHOLESTEROL TOTAL: 182 mg/dL (ref 100–199)
Chol/HDL Ratio: 2.8 ratio (ref 0.0–4.4)
HDL: 65 mg/dL (ref >39)
LDL Calculated: 80 mg/dL (ref 0–99)
TRIGLYCERIDES: 187 mg/dL — AB (ref 0–149)
VLDL Cholesterol Cal: 37 mg/dL (ref 5–40)

## 2017-12-06 LAB — HEPATIC FUNCTION PANEL
ALK PHOS: 93 IU/L (ref 39–117)
ALT: 13 IU/L (ref 0–32)
AST: 16 IU/L (ref 0–40)
Albumin: 4 g/dL (ref 3.6–4.8)
BILIRUBIN, DIRECT: 0.05 mg/dL (ref 0.00–0.40)
Bilirubin Total: <0.2 mg/dL (ref 0.0–1.2)
Total Protein: 6.7 g/dL (ref 6.0–8.5)

## 2017-12-06 LAB — BASIC METABOLIC PANEL
BUN / CREAT RATIO: 21 (ref 12–28)
BUN: 20 mg/dL (ref 8–27)
CO2: 22 mmol/L (ref 20–29)
CREATININE: 0.95 mg/dL (ref 0.57–1.00)
Calcium: 9.6 mg/dL (ref 8.7–10.3)
Chloride: 103 mmol/L (ref 96–106)
GFR calc Af Amer: 75 mL/min/{1.73_m2} (ref >59)
GFR, EST NON AFRICAN AMERICAN: 65 mL/min/{1.73_m2} (ref >59)
GLUCOSE: 127 mg/dL — AB (ref 65–99)
POTASSIUM: 4.6 mmol/L (ref 3.5–5.2)
Sodium: 137 mmol/L (ref 134–144)

## 2017-12-06 LAB — TSH: TSH: 1.71 u[IU]/mL (ref 0.450–4.500)

## 2017-12-06 NOTE — Patient Instructions (Signed)
Medication Instructions:  None.  If you need a refill on your cardiac medications before your next appointment, please call your pharmacy.   Lab work: None.  If you have labs (blood work) drawn today and your tests are completely normal, you will receive your results only by: Marland Kitchen MyChart Message (if you have MyChart) OR . A paper copy in the mail If you have any lab test that is abnormal or we need to change your treatment, we will call you to review the results.  Testing/Procedures: None.   Follow-Up: Follow up as previously advised.   Any Other Special Instructions Will Be Listed Below (If Applicable).   Hypertension Hypertension, commonly called high blood pressure, is when the force of blood pumping through the arteries is too strong. The arteries are the blood vessels that carry blood from the heart throughout the body. Hypertension forces the heart to work harder to pump blood and may cause arteries to become narrow or stiff. Having untreated or uncontrolled hypertension can cause heart attacks, strokes, kidney disease, and other problems. A blood pressure reading consists of a higher number over a lower number. Ideally, your blood pressure should be below 120/80. The first ("top") number is called the systolic pressure. It is a measure of the pressure in your arteries as your heart beats. The second ("bottom") number is called the diastolic pressure. It is a measure of the pressure in your arteries as the heart relaxes. What are the causes? The cause of this condition is not known. What increases the risk? Some risk factors for high blood pressure are under your control. Others are not. Factors you can change  Smoking.  Having type 2 diabetes mellitus, high cholesterol, or both.  Not getting enough exercise or physical activity.  Being overweight.  Having too much fat, sugar, calories, or salt (sodium) in your diet.  Drinking too much alcohol. Factors that are difficult  or impossible to change  Having chronic kidney disease.  Having a family history of high blood pressure.  Age. Risk increases with age.  Race. You may be at higher risk if you are African-American.  Gender. Men are at higher risk than women before age 80. After age 53, women are at higher risk than men.  Having obstructive sleep apnea.  Stress. What are the signs or symptoms? Extremely high blood pressure (hypertensive crisis) may cause:  Headache.  Anxiety.  Shortness of breath.  Nosebleed.  Nausea and vomiting.  Severe chest pain.  Jerky movements you cannot control (seizures).  How is this diagnosed? This condition is diagnosed by measuring your blood pressure while you are seated, with your arm resting on a surface. The cuff of the blood pressure monitor will be placed directly against the skin of your upper arm at the level of your heart. It should be measured at least twice using the same arm. Certain conditions can cause a difference in blood pressure between your right and left arms. Certain factors can cause blood pressure readings to be lower or higher than normal (elevated) for a short period of time:  When your blood pressure is higher when you are in a health care provider's office than when you are at home, this is called white coat hypertension. Most people with this condition do not need medicines.  When your blood pressure is higher at home than when you are in a health care provider's office, this is called masked hypertension. Most people with this condition may need medicines to control  blood pressure.  If you have a high blood pressure reading during one visit or you have normal blood pressure with other risk factors:  You may be asked to return on a different day to have your blood pressure checked again.  You may be asked to monitor your blood pressure at home for 1 week or longer.  If you are diagnosed with hypertension, you may have other blood  or imaging tests to help your health care provider understand your overall risk for other conditions. How is this treated? This condition is treated by making healthy lifestyle changes, such as eating healthy foods, exercising more, and reducing your alcohol intake. Your health care provider may prescribe medicine if lifestyle changes are not enough to get your blood pressure under control, and if:  Your systolic blood pressure is above 130.  Your diastolic blood pressure is above 80.  Your personal target blood pressure may vary depending on your medical conditions, your age, and other factors. Follow these instructions at home: Eating and drinking  Eat a diet that is high in fiber and potassium, and low in sodium, added sugar, and fat. An example eating plan is called the DASH (Dietary Approaches to Stop Hypertension) diet. To eat this way: ? Eat plenty of fresh fruits and vegetables. Try to fill half of your plate at each meal with fruits and vegetables. ? Eat whole grains, such as whole wheat pasta, brown rice, or whole grain bread. Fill about one quarter of your plate with whole grains. ? Eat or drink low-fat dairy products, such as skim milk or low-fat yogurt. ? Avoid fatty cuts of meat, processed or cured meats, and poultry with skin. Fill about one quarter of your plate with lean proteins, such as fish, chicken without skin, beans, eggs, and tofu. ? Avoid premade and processed foods. These tend to be higher in sodium, added sugar, and fat.  Reduce your daily sodium intake. Most people with hypertension should eat less than 1,500 mg of sodium a day.  Limit alcohol intake to no more than 1 drink a day for nonpregnant women and 2 drinks a day for men. One drink equals 12 oz of beer, 5 oz of wine, or 1 oz of hard liquor. Lifestyle  Work with your health care provider to maintain a healthy body weight or to lose weight. Ask what an ideal weight is for you.  Get at least 30 minutes of  exercise that causes your heart to beat faster (aerobic exercise) most days of the week. Activities may include walking, swimming, or biking.  Include exercise to strengthen your muscles (resistance exercise), such as pilates or lifting weights, as part of your weekly exercise routine. Try to do these types of exercises for 30 minutes at least 3 days a week.  Do not use any products that contain nicotine or tobacco, such as cigarettes and e-cigarettes. If you need help quitting, ask your health care provider.  Monitor your blood pressure at home as told by your health care provider.  Keep all follow-up visits as told by your health care provider. This is important. Medicines  Take over-the-counter and prescription medicines only as told by your health care provider. Follow directions carefully. Blood pressure medicines must be taken as prescribed.  Do not skip doses of blood pressure medicine. Doing this puts you at risk for problems and can make the medicine less effective.  Ask your health care provider about side effects or reactions to medicines that you should  watch for. Contact a health care provider if:  You think you are having a reaction to a medicine you are taking.  You have headaches that keep coming back (recurring).  You feel dizzy.  You have swelling in your ankles.  You have trouble with your vision. Get help right away if:  You develop a severe headache or confusion.  You have unusual weakness or numbness.  You feel faint.  You have severe pain in your chest or abdomen.  You vomit repeatedly.  You have trouble breathing. Summary  Hypertension is when the force of blood pumping through your arteries is too strong. If this condition is not controlled, it may put you at risk for serious complications.  Your personal target blood pressure may vary depending on your medical conditions, your age, and other factors. For most people, a normal blood pressure is  less than 120/80.  Hypertension is treated with lifestyle changes, medicines, or a combination of both. Lifestyle changes include weight loss, eating a healthy, low-sodium diet, exercising more, and limiting alcohol. This information is not intended to replace advice given to you by your health care provider. Make sure you discuss any questions you have with your health care provider. Document Released: 12/22/2004 Document Revised: 11/20/2015 Document Reviewed: 11/20/2015 Elsevier Interactive Patient Education  [employer].

## 2017-12-06 NOTE — Progress Notes (Signed)
Patient here for blood pressure check, pulse heck, and labs after switching from Losartan to Olmesartan 10 mg daily. Her blood pressure and pulse rate were reviewed by Dr. Bettina Gavia. She was advised to continue current management, she verbally understands. Additional information about hypertension provided for patient per Dr. Bettina Gavia request. During visit patient also requested to switch to Dr. Bettina Gavia for her primary cardiologist. Will inform Dr. Bettina Gavia and Dr. Geraldo Pitter.

## 2017-12-07 ENCOUNTER — Telehealth: Payer: Self-pay

## 2017-12-07 NOTE — Telephone Encounter (Signed)
-----   Message from Jenean Lindau, MD sent at 12/07/2017  8:19 AM EST ----- The results of the study is unremarkable. Please inform patient. I will discuss in detail at next appointment. Cc  primary care/referring physician Jenean Lindau, MD 12/07/2017 8:19 AM

## 2017-12-07 NOTE — Telephone Encounter (Signed)
Patient called and notified of lab results. 

## 2017-12-15 ENCOUNTER — Ambulatory Visit: Payer: Medicare HMO | Admitting: Sports Medicine

## 2017-12-23 DIAGNOSIS — Z9013 Acquired absence of bilateral breasts and nipples: Secondary | ICD-10-CM | POA: Diagnosis not present

## 2017-12-27 DIAGNOSIS — Z4789 Encounter for other orthopedic aftercare: Secondary | ICD-10-CM | POA: Diagnosis not present

## 2017-12-27 DIAGNOSIS — Z9013 Acquired absence of bilateral breasts and nipples: Secondary | ICD-10-CM | POA: Diagnosis not present

## 2017-12-27 DIAGNOSIS — Z4889 Encounter for other specified surgical aftercare: Secondary | ICD-10-CM | POA: Diagnosis not present

## 2017-12-28 ENCOUNTER — Other Ambulatory Visit: Payer: Self-pay | Admitting: Neurology

## 2017-12-31 DIAGNOSIS — M542 Cervicalgia: Secondary | ICD-10-CM | POA: Diagnosis not present

## 2017-12-31 DIAGNOSIS — E1149 Type 2 diabetes mellitus with other diabetic neurological complication: Secondary | ICD-10-CM | POA: Diagnosis not present

## 2017-12-31 DIAGNOSIS — M549 Dorsalgia, unspecified: Secondary | ICD-10-CM | POA: Diagnosis not present

## 2017-12-31 DIAGNOSIS — G5600 Carpal tunnel syndrome, unspecified upper limb: Secondary | ICD-10-CM | POA: Diagnosis not present

## 2017-12-31 DIAGNOSIS — Z4889 Encounter for other specified surgical aftercare: Secondary | ICD-10-CM | POA: Diagnosis not present

## 2017-12-31 DIAGNOSIS — D860 Sarcoidosis of lung: Secondary | ICD-10-CM | POA: Diagnosis not present

## 2017-12-31 DIAGNOSIS — G629 Polyneuropathy, unspecified: Secondary | ICD-10-CM | POA: Diagnosis not present

## 2018-01-03 NOTE — Telephone Encounter (Signed)
Pt does take 1AM/2PM.  Rx corrected.

## 2018-01-03 NOTE — Telephone Encounter (Signed)
Can you pls confirm with patient, on last visit I increased Pregabalin 150mg : 1 cap in AM, 2 caps in PM. Thanks!

## 2018-01-07 DIAGNOSIS — Z4889 Encounter for other specified surgical aftercare: Secondary | ICD-10-CM | POA: Diagnosis not present

## 2018-01-07 DIAGNOSIS — M542 Cervicalgia: Secondary | ICD-10-CM | POA: Diagnosis not present

## 2018-01-09 DIAGNOSIS — M5432 Sciatica, left side: Secondary | ICD-10-CM | POA: Diagnosis not present

## 2018-01-09 DIAGNOSIS — M5412 Radiculopathy, cervical region: Secondary | ICD-10-CM | POA: Diagnosis not present

## 2018-01-09 DIAGNOSIS — M9903 Segmental and somatic dysfunction of lumbar region: Secondary | ICD-10-CM | POA: Diagnosis not present

## 2018-01-09 DIAGNOSIS — M9902 Segmental and somatic dysfunction of thoracic region: Secondary | ICD-10-CM | POA: Diagnosis not present

## 2018-01-09 DIAGNOSIS — M9901 Segmental and somatic dysfunction of cervical region: Secondary | ICD-10-CM | POA: Diagnosis not present

## 2018-01-09 DIAGNOSIS — M4304 Spondylolysis, thoracic region: Secondary | ICD-10-CM | POA: Diagnosis not present

## 2018-01-13 ENCOUNTER — Other Ambulatory Visit: Payer: Self-pay | Admitting: Cardiology

## 2018-01-14 DIAGNOSIS — Z4889 Encounter for other specified surgical aftercare: Secondary | ICD-10-CM | POA: Diagnosis not present

## 2018-01-14 DIAGNOSIS — M542 Cervicalgia: Secondary | ICD-10-CM | POA: Diagnosis not present

## 2018-01-27 DIAGNOSIS — G4733 Obstructive sleep apnea (adult) (pediatric): Secondary | ICD-10-CM | POA: Diagnosis not present

## 2018-02-07 DIAGNOSIS — M542 Cervicalgia: Secondary | ICD-10-CM | POA: Diagnosis not present

## 2018-02-07 DIAGNOSIS — Z4889 Encounter for other specified surgical aftercare: Secondary | ICD-10-CM | POA: Diagnosis not present

## 2018-02-09 DIAGNOSIS — E1169 Type 2 diabetes mellitus with other specified complication: Secondary | ICD-10-CM | POA: Diagnosis not present

## 2018-02-09 DIAGNOSIS — R05 Cough: Secondary | ICD-10-CM | POA: Diagnosis not present

## 2018-02-09 DIAGNOSIS — F5104 Psychophysiologic insomnia: Secondary | ICD-10-CM | POA: Diagnosis not present

## 2018-02-09 DIAGNOSIS — E1149 Type 2 diabetes mellitus with other diabetic neurological complication: Secondary | ICD-10-CM | POA: Diagnosis not present

## 2018-02-09 DIAGNOSIS — E1159 Type 2 diabetes mellitus with other circulatory complications: Secondary | ICD-10-CM | POA: Diagnosis not present

## 2018-02-09 DIAGNOSIS — Z6841 Body Mass Index (BMI) 40.0 and over, adult: Secondary | ICD-10-CM | POA: Diagnosis not present

## 2018-02-09 DIAGNOSIS — E559 Vitamin D deficiency, unspecified: Secondary | ICD-10-CM | POA: Diagnosis not present

## 2018-02-09 DIAGNOSIS — Z79899 Other long term (current) drug therapy: Secondary | ICD-10-CM | POA: Diagnosis not present

## 2018-02-09 DIAGNOSIS — E039 Hypothyroidism, unspecified: Secondary | ICD-10-CM | POA: Diagnosis not present

## 2018-02-21 DIAGNOSIS — Z4889 Encounter for other specified surgical aftercare: Secondary | ICD-10-CM | POA: Diagnosis not present

## 2018-02-21 DIAGNOSIS — M542 Cervicalgia: Secondary | ICD-10-CM | POA: Diagnosis not present

## 2018-02-25 DIAGNOSIS — M2040 Other hammer toe(s) (acquired), unspecified foot: Secondary | ICD-10-CM | POA: Diagnosis not present

## 2018-02-25 DIAGNOSIS — M1711 Unilateral primary osteoarthritis, right knee: Secondary | ICD-10-CM | POA: Diagnosis not present

## 2018-02-28 DIAGNOSIS — M542 Cervicalgia: Secondary | ICD-10-CM | POA: Diagnosis not present

## 2018-02-28 DIAGNOSIS — Z4889 Encounter for other specified surgical aftercare: Secondary | ICD-10-CM | POA: Diagnosis not present

## 2018-03-02 ENCOUNTER — Other Ambulatory Visit: Payer: Self-pay

## 2018-03-02 ENCOUNTER — Encounter: Payer: Self-pay | Admitting: Neurology

## 2018-03-02 ENCOUNTER — Other Ambulatory Visit (INDEPENDENT_AMBULATORY_CARE_PROVIDER_SITE_OTHER): Payer: Medicare HMO

## 2018-03-02 ENCOUNTER — Ambulatory Visit (INDEPENDENT_AMBULATORY_CARE_PROVIDER_SITE_OTHER): Payer: Medicare HMO | Admitting: Neurology

## 2018-03-02 VITALS — BP 122/68 | HR 72 | Ht 60.0 in | Wt 213.0 lb

## 2018-03-02 DIAGNOSIS — F329 Major depressive disorder, single episode, unspecified: Secondary | ICD-10-CM | POA: Diagnosis not present

## 2018-03-02 DIAGNOSIS — E0842 Diabetes mellitus due to underlying condition with diabetic polyneuropathy: Secondary | ICD-10-CM

## 2018-03-02 DIAGNOSIS — G44229 Chronic tension-type headache, not intractable: Secondary | ICD-10-CM

## 2018-03-02 DIAGNOSIS — F32A Depression, unspecified: Secondary | ICD-10-CM

## 2018-03-02 MED ORDER — PREGABALIN 150 MG PO CAPS: ORAL_CAPSULE | ORAL | 3 refills | Status: DC

## 2018-03-02 NOTE — Progress Notes (Signed)
NEUROLOGY FOLLOW UP OFFICE NOTE  Melanie Meyers [id number]  DOB: August 10, 1955  HISTORY OF PRESENT ILLNESS: I had the pleasure of seeing Melanie Meyers in follow-up in the neurology clinic on 03/02/2018.  The patient was last seen 7 months ago. She continues to report diffuse nagging headaches on a daily basis. She previously had a good response to Lyrica 450mg  daily but reduced dose and noticed an increase in headaches and neuropathic pain. She was instructed to increase dose back on last visit, but states she continues to take 150mg  BID and forgot to do this. She is reporting memory/word confusion and being scatterbrained. She previously reported memory issues in 2017, MMSE 30/30. MRI brain with and without contrast no acute changes. She is always tired. She gets 7 hours of sleep with her CPAP machine but does not feel refreshed in the morning. She does not need naps. Her neck pain is better after neck surgery last October 2019. She denies getting lost driving, no missed bills. She occasionally forgets her medications but overall good with her pillbox. She reports being under a lot of stress with her daughter/granddaughter living with her, and her son and his family living beside her but at her home all the time. She is irritable all the time. She denies any dizziness, vision changes, focal numbness/tingling/weakness. She fell last month changing a light bulb, no injuries.  HPI 10/09/15: This is a 63 yo RH woman with a history of hypertension, diabetes, sleep apnea on CPAP, sarcoidosis, B12 deficiency, hypothyroidism, anxiety, migraines, with a transient episode of loss of awareness last 07/24/2015. She recalls sitting in the car with her daughter then suddenly feeling sleepy. She recalls closing her eyes, then waking up in the ER. She then went to [facility] ER, records reviewed. Her other daughter witnessed her head bobbing back and forth, she was not responding to questions although she was reported to  be awake. She was told her face was droopy and her speech was slurred when she asked her daughter why she was being taken to the ER (she does not recall this). They stopped on the road and she got out of the car, then fell backwards, no injuries. They went to a different ER but left due to a long wait, then went to [facility] where she was back to baseline. The episode lasted 45 minutes. She reported a mild headache with photophobia all day prior to the event, and was mildly nauseated in the car. She reported feeling mildly numb on her left arm, but today denies any focal symptoms. Bloodwork showed unremarkable CBC, CMP, negative ethanol level. I personally reviewed head CT without contrast which did not show any acute changes. She denies any further episodes since then, no history of similar episodes in the past.  She and her daughter deny any other staring/unresponsive episodes or gaps in time, no olfactory/gustatory hallucinations, deja vu, rising epigastric sensation, focal numbness/tingling/weakness, myoclonic jerks. She has a history of migraines that used to respond to Relpax, however over the past 3-4 months, she has been having a constant throbbing headache over the vertex and occipital regions, waxing and waning in intensity, with associated photosensitivity, no nausea/vomiting. Imitrex does not help as much as the Relpax, however insurance will not cover Relpax. She has occasional dizziness described as lightheadedness and occasional spinning, lasting a few seconds, which can occur with any position. She has chronic neck and back pain. She has a history of neuropathy with numbness and tingling in both arms and  legs, she states the neuropathy in her arms are due to B12 deficiency, and due to diabetic neuropathy in her legs. Her last HbA1c was 6.1. She is taking Lyrica 75mg  BID. On higher dose (150mg  BID), she was told by her son she had psychiatric problems, which hurt her. She does not see Psychiatry. She  reports unrefreshing sleep despite use of CPAP machine. She has been forgetful for the past few months, she forgets words. She drives and denies getting lost driving. She lives with her daughter and denies any missed bill payments or missed medications. Her mother and maternal aunt had dementia. She reports starting Paxil a week before the episode, this was switched to Abilify 2 weeks ago.   She had a normal birth and early development.  There is no history of febrile convulsions, CNS infections such as meningitis/encephalitis, significant traumatic brain injury, neurosurgical procedures, or family history of seizures.    PAST MEDICAL HISTORY: Past Medical History:  Diagnosis Date  . Asthma   . B12 deficiency   . Back pain   . Carpal tunnel syndrome of right wrist   . Degenerative arthritis    degenerative arthritis of neck  . Depression   . Diabetes mellitus without complication ([place])   . Fibromyalgia   . GERD (gastroesophageal reflux disease)   . High cholesterol   . Hypertension   . Hypothyroid   . Insomnia   . Migraine   . Migraines   . Neuropathy   . Neuropathy   . OSA on CPAP   . Sarcoidosis   . Sleep apnea   . Thyroid disease   . Vitamin D deficiency     MEDICATIONS: Current Outpatient Medications on File Prior to Visit  Medication Sig Dispense Refill  . ACCU-CHEK AVIVA PLUS test strip 1 each by Other route as needed (for BS).     Marland Kitchen albuterol (VENTOLIN HFA) 108 (90 Base) MCG/ACT inhaler Inhale 2 puffs into the lungs every 6 (six) hours as needed for wheezing or shortness of breath.     Marland Kitchen aspirin EC 81 MG tablet Take 81 mg by mouth at bedtime.     . Biotin 10000 MCG TABS Take 10,000 mcg by mouth at bedtime.    . budesonide-formoterol (SYMBICORT) 160-4.5 MCG/ACT inhaler Inhale two puffs twice daily with spacer to prevent cough or wheeze.  Rinse, gargle, and spit after use. 1 Inhaler 5  . cetirizine (ZYRTEC) 10 MG tablet Take 10 mg by mouth at bedtime.     Marland Kitchen co-enzyme  Q-10 30 MG capsule Take 30 mg by mouth at bedtime.    . Insulin Glargine-Lixisenatide (SOLIQUA) 100-33 UNT-MCG/ML SOPN Inject 30 Units into the skin daily.    Marland Kitchen levothyroxine (SYNTHROID, LEVOTHROID) 25 MCG tablet Take 25 mcg by mouth daily before breakfast.    . loratadine (CLARITIN) 10 MG tablet Take 10 mg by mouth at bedtime.     . Magnesium 250 MG TABS Take 250 mg by mouth at bedtime.    . Menthol, Topical Analgesic, (BIOFREEZE EX) Apply 1 application topically daily as needed (muscle pain).    . metFORMIN (GLUCOPHAGE) 1000 MG tablet Take 1 tablet (1,000 mg total) by mouth 2 (two) times daily with a meal.    . methocarbamol (ROBAXIN) 500 MG tablet Take 1 tablet (500 mg total) by mouth 3 (three) times daily. (Patient taking differently: Take 500 mg by mouth 2 (two) times daily. ) 30 tablet 0  . mometasone (NASONEX) 50 MCG/ACT nasal spray Place 1  spray into the nose at bedtime.    . montelukast (SINGULAIR) 10 MG tablet Take 10 mg by mouth at bedtime.     . Multiple Vitamin (MULTIVITAMIN WITH MINERALS) TABS tablet Take 1 tablet by mouth at bedtime.    . Multiple Vitamins-Minerals (ANTIOXIDANT) CAPS Take 1 capsule by mouth at bedtime.    Marland Kitchen olmesartan (BENICAR) 20 MG tablet TAKE 1/2 TABLET BY MOUTH ONCE DAILY 30 tablet 3  . Omega-3 Fatty Acids (FISH OIL) 1000 MG CAPS Take 2,000 mg by mouth at bedtime.    . ondansetron (ZOFRAN ODT) 4 MG disintegrating tablet Take 1 tablet (4 mg total) by mouth every 8 (eight) hours as needed. 20 tablet 0  . polyethylene glycol (MIRALAX / GLYCOLAX) packet Take 17 g by mouth daily as needed for mild constipation.     . potassium gluconate (HM POTASSIUM) 595 (99 K) MG TABS tablet Take 595 mg by mouth daily as needed (cramps).    . pravastatin (PRAVACHOL) 40 MG tablet Take 40 mg by mouth daily.     . pregabalin (LYRICA) 100 MG capsule Take 1 capsule in morning and 2 capsules in evening 270 capsule 0  . pregabalin (LYRICA) 150 MG capsule Take  1 cap in AM, 2 caps in PM  and continue (Patient taking differently: Take 150 mg by mouth 2 (two) times daily. ) 270 capsule 3  . ranitidine (ZANTAC) 300 MG tablet TAKE 1 TABLET AT BEDTIME (Patient taking differently: Take 300 mg by mouth as needed. ) 90 tablet 0  . rizatriptan (MAXALT) 10 MG tablet Take 10 mg by mouth as needed for migraine.     . sertraline (ZOLOFT) 50 MG tablet Take 50 mg by mouth at bedtime.    Marland Kitchen UNIFINE PENTIPS 31G X 6 MM MISC     . zolpidem (AMBIEN) 10 MG tablet Take 10 mg by mouth at bedtime as needed for sleep.     . nitroGLYCERIN (NITROSTAT) 0.4 MG SL tablet Place 1 tablet (0.4 mg total) under the tongue every 5 (five) minutes as needed. (Patient taking differently: Place 0.4 mg under the tongue every 5 (five) minutes as needed for chest pain. ) 25 tablet 11   Current Facility-Administered Medications on File Prior to Visit  Medication Dose Route Frequency Provider Last Rate Last Dose  . triamcinolone acetonide (KENALOG) 10 MG/ML injection 10 mg  10 mg Other Once Landis Martins, DPM      . triamcinolone acetonide (KENALOG) 10 MG/ML injection 10 mg  10 mg Other Once Landis Martins, DPM        ALLERGIES: Allergies  Allergen Reactions  . Accupril [Quinapril Hcl] Cough  . Prednisone Other (See Comments)    Has to be cautious due to Blood Sugar  . Levaquin [Levofloxacin] Rash    FAMILY HISTORY: Family History  Problem Relation Age of Onset  . Parkinson's disease Mother   . Diabetes Mother   . Alzheimer's disease Mother   . Gout Mother   . High blood pressure Father   . High Cholesterol Father   . Heart attack Father   . Thyroid disease Sister   . Heart attack Sister   . Thyroid disease Sister   . Thyroid disease Sister     SOCIAL HISTORY: Social History   Socioeconomic History  . Marital status: Widowed    Spouse name: Not on file  . Number of children: Not on file  . Years of education: Not on file  . Highest education level: Not  on file  Occupational History  . Not on  file  Social Needs  . Financial resource strain: Not on file  . Food insecurity:    Worry: Not on file    Inability: Not on file  . Transportation needs:    Medical: Not on file    Non-medical: Not on file  Tobacco Use  . Smoking status: Never Smoker  . Smokeless tobacco: Never Used  Substance and Sexual Activity  . Alcohol use: No    Alcohol/week: 0.0 standard drinks  . Drug use: No  . Sexual activity: Not on file  Lifestyle  . Physical activity:    Days per week: Not on file    Minutes per session: Not on file  . Stress: Not on file  Relationships  . Social connections:    Talks on phone: Not on file    Gets together: Not on file    Attends religious service: Not on file    Active member of club or organization: Not on file    Attends meetings of clubs or organizations: Not on file    Relationship status: Not on file  . Intimate partner violence:    Fear of current or ex partner: Not on file    Emotionally abused: Not on file    Physically abused: Not on file    Forced sexual activity: Not on file  Other Topics Concern  . Not on file  Social History Narrative  . Not on file    REVIEW OF SYSTEMS: Constitutional: No fevers, chills, or sweats, no generalized fatigue, change in appetite Eyes: No visual changes, double vision, eye pain Ear, nose and throat: No hearing loss, ear pain, nasal congestion, sore throat Cardiovascular: No chest pain, palpitations Respiratory:  No shortness of breath at rest or with exertion, wheezes GastrointestinaI: No nausea, vomiting, diarrhea, abdominal pain, fecal incontinence Genitourinary:  No dysuria, urinary retention or frequency Musculoskeletal:  + neck pain,+ back pain Integumentary: No rash, pruritus, skin lesions Neurological: as above Psychiatric: + depression, no insomnia, anxiety Endocrine: No palpitations, fatigue, diaphoresis, mood swings, change in appetite, change in weight, increased thirst Hematologic/Lymphatic:  No  anemia, purpura, petechiae. Allergic/Immunologic: no itchy/runny eyes, nasal congestion, recent allergic reactions, rashes  PHYSICAL EXAM: Vitals:   03/02/18 0827  BP: 122/68  Pulse: 72  SpO2: 98%   General: No acute distress Head:  Normocephalic/atraumatic Neck: supple, no paraspinal tenderness, full range of motion Heart:  Regular rate and rhythm Lungs:  Clear to auscultation bilaterally Back: No paraspinal tenderness Skin/Extremities: No rash, no edema Neurological Exam: alert and oriented to person, place, and time. No aphasia or dysarthria. Fund of knowledge is appropriate.  Recent and remote memory are intact. 3/3 delayed recall.  Attention and concentration are normal, able to spell WORLD forward and backward.    Able to name objects and repeat phrases. CDT 5/5 MMSE - Mini Mental State Exam 03/02/2018 10/10/2015  Orientation to time 5 5  Orientation to Place 5 5  Registration 3 3  Attention/ Calculation 5 5  Recall 3 3  Language- name 2 objects 2 2  Language- repeat 1 1  Language- follow 3 step command 3 3  Language- read & follow direction 1 1  Write a sentence 1 1  Copy design 1 1  Total score 30 30    Cranial nerves: Pupils equal, round, reactive to light.  Extraocular movements intact with no nystagmus. Visual fields full. Facial sensation intact. No facial asymmetry. Tongue, uvula, palate  midline.  Motor: Bulk and tone normal, muscle strength 5/5 throughout with no pronator drift.  Sensation to light touch intact.  No extinction to double simultaneous stimulation.  Deep tendon reflexes +2 throughout, toes downgoing.  Finger to nose testing intact.  Gait narrow-based and steady, difficulty with tandem walk (similar to prior).  Romberg positive.  IMPRESSION: This is a 63 yo RH woman with a history of history of hypertension, diabetes, sleep apnea on CPAP, sarcoidosis, B12 deficiency, hypothyroidism, anxiety, migraines, neuropathy. She initially presented after a transient  episode of loss of awareness in 2017, MRI brain and EEG normal, no further episodes since then. Her main issue has been chronic daily headaches and neuropathy. She previously had good response to higher dose Lyrica, we discussed increasing back to 150mg  in AM, 300mg  in PM. She continues to report memory issues, MMSE today 30/30. We discussed different causes of memory issues, check B12 level. We discussed how stress/anxiety/depression can cause cognitive issues, she is agreeable to seeing a therapist. She will be referred for Neurocognitive testing. Follow-up in 6 months, she knows to call for any changes.   Thank you for allowing me to participate in her care.  Please do not hesitate to call for any questions or concerns.  The duration of this appointment visit was 30 minutes of face-to-face time with the patient.  Greater than 50% of this time was spent in counseling, explanation of diagnosis, planning of further management, and coordination of care.   Ellouise Newer, M.D.   CC: Lucila Maine, PA-C

## 2018-03-02 NOTE — Patient Instructions (Addendum)
1. Increase Lyrica 150mg : take 1 cap in AM, 2 caps in PM 2. Refer for counseling in [place]  We have placed a referral to [facility].  Their office should be in contact with you to schedule your initial appointment.  If you do not hear from them, their contact information is listed below:  [facility] [address] [place], [place] [address] Phone: [phone]  3. Once we have a Neuropsychologist to do memory testing, we will get you scheduled  Our office will be in touch to schedule your testing once our Neuropsychologist starts.   4. Follow-up in 6 months or so, call for any changes

## 2018-03-03 LAB — VITAMIN B12: VITAMIN B 12: 608 pg/mL (ref 200–1100)

## 2018-03-07 DIAGNOSIS — Z4889 Encounter for other specified surgical aftercare: Secondary | ICD-10-CM | POA: Diagnosis not present

## 2018-03-07 DIAGNOSIS — M542 Cervicalgia: Secondary | ICD-10-CM | POA: Diagnosis not present

## 2018-03-08 ENCOUNTER — Telehealth: Payer: Self-pay | Admitting: Neurology

## 2018-03-08 NOTE — Telephone Encounter (Signed)
-----   Message from Cameron Sprang, MD sent at 03/08/2018  9:17 AM EST ----- Pls let her know B12 level is normal, thanks

## 2018-03-08 NOTE — Telephone Encounter (Signed)
Mychart message sent to patient.

## 2018-03-14 DIAGNOSIS — Z4889 Encounter for other specified surgical aftercare: Secondary | ICD-10-CM | POA: Diagnosis not present

## 2018-03-14 DIAGNOSIS — M542 Cervicalgia: Secondary | ICD-10-CM | POA: Diagnosis not present

## 2018-03-17 DIAGNOSIS — Z4889 Encounter for other specified surgical aftercare: Secondary | ICD-10-CM | POA: Diagnosis not present

## 2018-03-17 DIAGNOSIS — M542 Cervicalgia: Secondary | ICD-10-CM | POA: Diagnosis not present

## 2018-03-21 DIAGNOSIS — M542 Cervicalgia: Secondary | ICD-10-CM | POA: Diagnosis not present

## 2018-03-21 DIAGNOSIS — Z4889 Encounter for other specified surgical aftercare: Secondary | ICD-10-CM | POA: Diagnosis not present

## 2018-03-22 DIAGNOSIS — G4733 Obstructive sleep apnea (adult) (pediatric): Secondary | ICD-10-CM | POA: Diagnosis not present

## 2018-03-22 DIAGNOSIS — D869 Sarcoidosis, unspecified: Secondary | ICD-10-CM | POA: Diagnosis not present

## 2018-03-22 DIAGNOSIS — J309 Allergic rhinitis, unspecified: Secondary | ICD-10-CM | POA: Diagnosis not present

## 2018-03-22 DIAGNOSIS — R918 Other nonspecific abnormal finding of lung field: Secondary | ICD-10-CM | POA: Diagnosis not present

## 2018-03-24 DIAGNOSIS — Z4889 Encounter for other specified surgical aftercare: Secondary | ICD-10-CM | POA: Diagnosis not present

## 2018-03-24 DIAGNOSIS — M542 Cervicalgia: Secondary | ICD-10-CM | POA: Diagnosis not present

## 2018-03-28 DIAGNOSIS — M1712 Unilateral primary osteoarthritis, left knee: Secondary | ICD-10-CM | POA: Diagnosis not present

## 2018-03-28 DIAGNOSIS — M170 Bilateral primary osteoarthritis of knee: Secondary | ICD-10-CM | POA: Diagnosis not present

## 2018-03-28 DIAGNOSIS — M1710 Unilateral primary osteoarthritis, unspecified knee: Secondary | ICD-10-CM | POA: Insufficient documentation

## 2018-03-28 DIAGNOSIS — M179 Osteoarthritis of knee, unspecified: Secondary | ICD-10-CM | POA: Insufficient documentation

## 2018-03-28 DIAGNOSIS — M7051 Other bursitis of knee, right knee: Secondary | ICD-10-CM | POA: Diagnosis not present

## 2018-03-28 DIAGNOSIS — M1711 Unilateral primary osteoarthritis, right knee: Secondary | ICD-10-CM | POA: Diagnosis not present

## 2018-04-06 DIAGNOSIS — M79671 Pain in right foot: Secondary | ICD-10-CM | POA: Diagnosis not present

## 2018-04-06 DIAGNOSIS — M7741 Metatarsalgia, right foot: Secondary | ICD-10-CM | POA: Diagnosis not present

## 2018-04-06 DIAGNOSIS — M2021 Hallux rigidus, right foot: Secondary | ICD-10-CM | POA: Diagnosis not present

## 2018-04-06 DIAGNOSIS — M19071 Primary osteoarthritis, right ankle and foot: Secondary | ICD-10-CM | POA: Diagnosis not present

## 2018-04-13 DIAGNOSIS — M2021 Hallux rigidus, right foot: Secondary | ICD-10-CM | POA: Insufficient documentation

## 2018-04-14 DIAGNOSIS — M1711 Unilateral primary osteoarthritis, right knee: Secondary | ICD-10-CM | POA: Diagnosis not present

## 2018-04-21 DIAGNOSIS — M1711 Unilateral primary osteoarthritis, right knee: Secondary | ICD-10-CM | POA: Diagnosis not present

## 2018-04-28 DIAGNOSIS — G4733 Obstructive sleep apnea (adult) (pediatric): Secondary | ICD-10-CM | POA: Diagnosis not present

## 2018-04-28 DIAGNOSIS — M1711 Unilateral primary osteoarthritis, right knee: Secondary | ICD-10-CM | POA: Diagnosis not present

## 2018-04-29 DIAGNOSIS — Z981 Arthrodesis status: Secondary | ICD-10-CM | POA: Diagnosis not present

## 2018-05-12 DIAGNOSIS — M9906 Segmental and somatic dysfunction of lower extremity: Secondary | ICD-10-CM | POA: Diagnosis not present

## 2018-05-12 DIAGNOSIS — M5136 Other intervertebral disc degeneration, lumbar region: Secondary | ICD-10-CM | POA: Diagnosis not present

## 2018-05-12 DIAGNOSIS — M9903 Segmental and somatic dysfunction of lumbar region: Secondary | ICD-10-CM | POA: Diagnosis not present

## 2018-05-12 DIAGNOSIS — M50323 Other cervical disc degeneration at C6-C7 level: Secondary | ICD-10-CM | POA: Diagnosis not present

## 2018-05-12 DIAGNOSIS — M5382 Other specified dorsopathies, cervical region: Secondary | ICD-10-CM | POA: Diagnosis not present

## 2018-05-12 DIAGNOSIS — M25552 Pain in left hip: Secondary | ICD-10-CM | POA: Diagnosis not present

## 2018-05-12 DIAGNOSIS — M542 Cervicalgia: Secondary | ICD-10-CM | POA: Diagnosis not present

## 2018-05-12 DIAGNOSIS — M5442 Lumbago with sciatica, left side: Secondary | ICD-10-CM | POA: Diagnosis not present

## 2018-05-12 DIAGNOSIS — M9901 Segmental and somatic dysfunction of cervical region: Secondary | ICD-10-CM | POA: Diagnosis not present

## 2018-05-13 DIAGNOSIS — E1140 Type 2 diabetes mellitus with diabetic neuropathy, unspecified: Secondary | ICD-10-CM | POA: Diagnosis not present

## 2018-05-13 DIAGNOSIS — G629 Polyneuropathy, unspecified: Secondary | ICD-10-CM | POA: Diagnosis not present

## 2018-05-13 DIAGNOSIS — E039 Hypothyroidism, unspecified: Secondary | ICD-10-CM | POA: Diagnosis not present

## 2018-05-13 DIAGNOSIS — Z6841 Body Mass Index (BMI) 40.0 and over, adult: Secondary | ICD-10-CM | POA: Diagnosis not present

## 2018-05-13 DIAGNOSIS — E559 Vitamin D deficiency, unspecified: Secondary | ICD-10-CM | POA: Diagnosis not present

## 2018-05-13 DIAGNOSIS — Z79899 Other long term (current) drug therapy: Secondary | ICD-10-CM | POA: Diagnosis not present

## 2018-05-13 DIAGNOSIS — E785 Hyperlipidemia, unspecified: Secondary | ICD-10-CM | POA: Diagnosis not present

## 2018-05-13 DIAGNOSIS — E1149 Type 2 diabetes mellitus with other diabetic neurological complication: Secondary | ICD-10-CM | POA: Diagnosis not present

## 2018-05-13 DIAGNOSIS — I10 Essential (primary) hypertension: Secondary | ICD-10-CM | POA: Diagnosis not present

## 2018-05-18 DIAGNOSIS — M9906 Segmental and somatic dysfunction of lower extremity: Secondary | ICD-10-CM | POA: Diagnosis not present

## 2018-05-18 DIAGNOSIS — M9901 Segmental and somatic dysfunction of cervical region: Secondary | ICD-10-CM | POA: Diagnosis not present

## 2018-05-18 DIAGNOSIS — M5136 Other intervertebral disc degeneration, lumbar region: Secondary | ICD-10-CM | POA: Diagnosis not present

## 2018-05-18 DIAGNOSIS — M5382 Other specified dorsopathies, cervical region: Secondary | ICD-10-CM | POA: Diagnosis not present

## 2018-05-18 DIAGNOSIS — M542 Cervicalgia: Secondary | ICD-10-CM | POA: Diagnosis not present

## 2018-05-18 DIAGNOSIS — M50323 Other cervical disc degeneration at C6-C7 level: Secondary | ICD-10-CM | POA: Diagnosis not present

## 2018-05-18 DIAGNOSIS — M25552 Pain in left hip: Secondary | ICD-10-CM | POA: Diagnosis not present

## 2018-05-18 DIAGNOSIS — M9903 Segmental and somatic dysfunction of lumbar region: Secondary | ICD-10-CM | POA: Diagnosis not present

## 2018-05-18 DIAGNOSIS — M5442 Lumbago with sciatica, left side: Secondary | ICD-10-CM | POA: Diagnosis not present

## 2018-05-20 DIAGNOSIS — M9903 Segmental and somatic dysfunction of lumbar region: Secondary | ICD-10-CM | POA: Diagnosis not present

## 2018-05-20 DIAGNOSIS — M9906 Segmental and somatic dysfunction of lower extremity: Secondary | ICD-10-CM | POA: Diagnosis not present

## 2018-05-20 DIAGNOSIS — M50323 Other cervical disc degeneration at C6-C7 level: Secondary | ICD-10-CM | POA: Diagnosis not present

## 2018-05-20 DIAGNOSIS — M9901 Segmental and somatic dysfunction of cervical region: Secondary | ICD-10-CM | POA: Diagnosis not present

## 2018-05-20 DIAGNOSIS — M25552 Pain in left hip: Secondary | ICD-10-CM | POA: Diagnosis not present

## 2018-05-20 DIAGNOSIS — M5442 Lumbago with sciatica, left side: Secondary | ICD-10-CM | POA: Diagnosis not present

## 2018-05-20 DIAGNOSIS — M542 Cervicalgia: Secondary | ICD-10-CM | POA: Diagnosis not present

## 2018-05-20 DIAGNOSIS — M5382 Other specified dorsopathies, cervical region: Secondary | ICD-10-CM | POA: Diagnosis not present

## 2018-05-20 DIAGNOSIS — M5136 Other intervertebral disc degeneration, lumbar region: Secondary | ICD-10-CM | POA: Diagnosis not present

## 2018-05-21 IMAGING — CT CT HEAD CODE STROKE
3 of 4 series · 14 of 47 positions shown, 16 images · non-contrast
Comparison: Paranasal sinus CT September 07, 2014

CLINICAL DATA: RIGHT-sided weakness, slurred speech beginning 6
hours ago, improved now. History of sarcoidosis, hypertension and
diabetes.

EXAM:
CT HEAD WITHOUT CONTRAST
TECHNIQUE: Contiguous axial images were obtained from the base of the skull
through the vertex without intravenous contrast.

[Series 2: head w/o · axial · non-contrast · 0.45mm/px · z∈[+1384,+1504]mm · 8 of 29 slices shown, 10 images]
[im 3/29  brain]
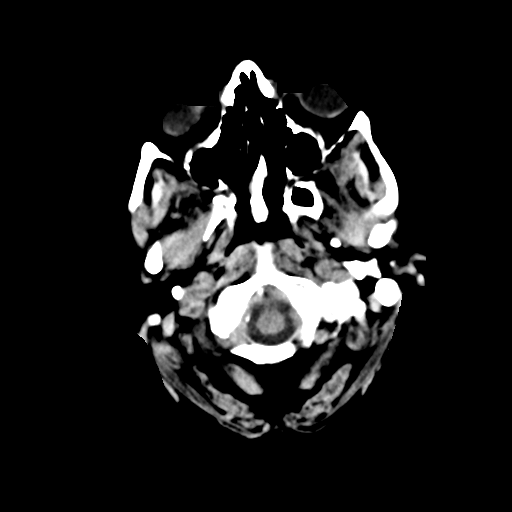
[im 3/29  bone]
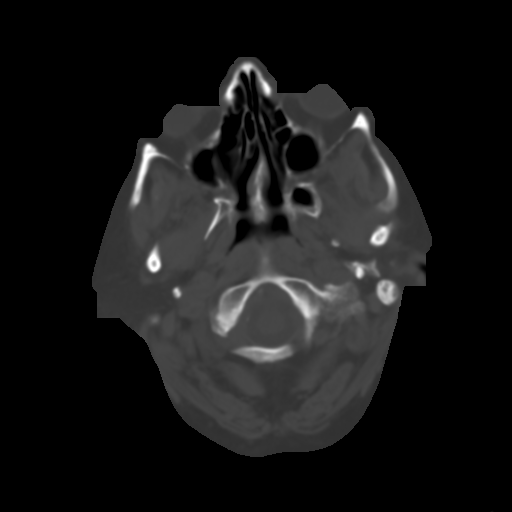
[im 7/29  brain]
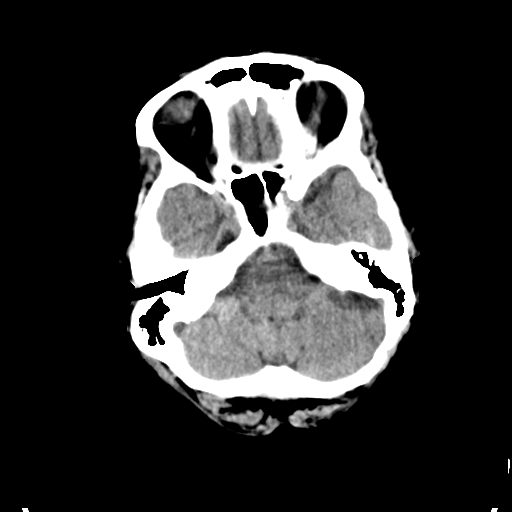
[im 11/29  brain]
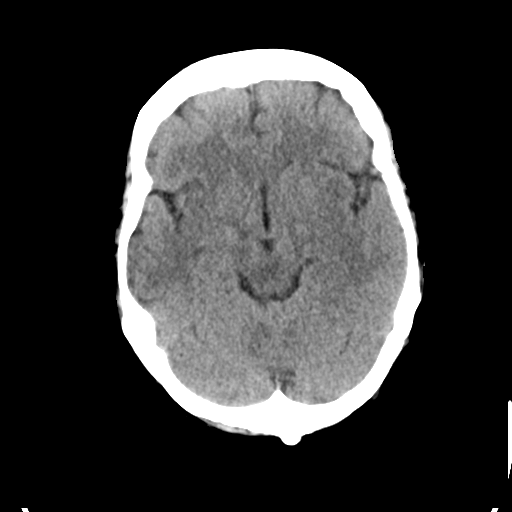
[im 13/29  brain]
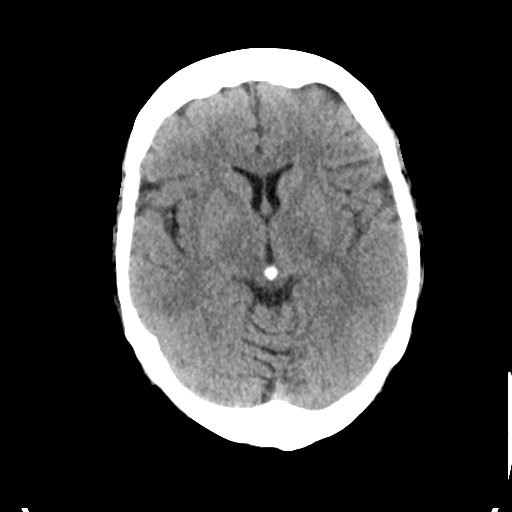
[im 17/29  brain]
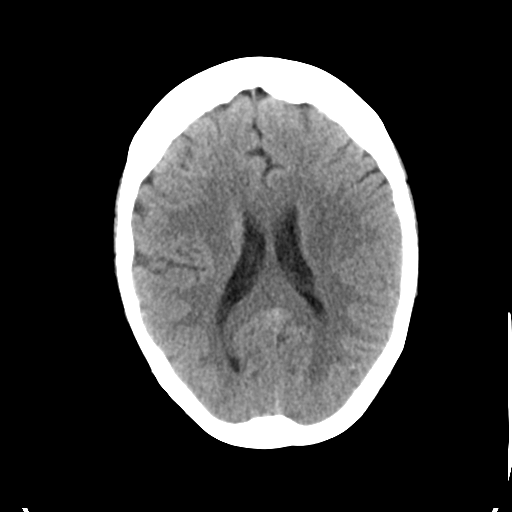
[im 17/29  bone]
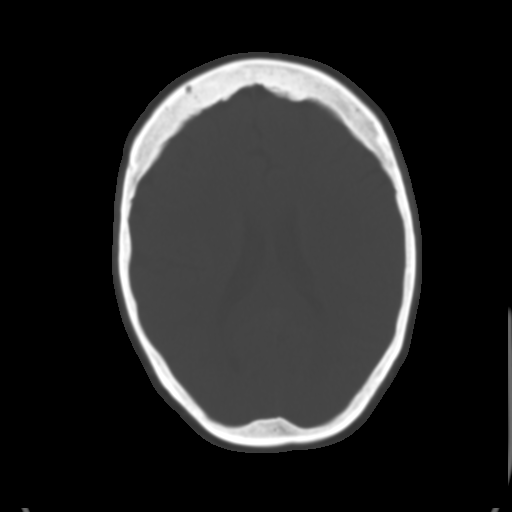
[im 19/29  brain]
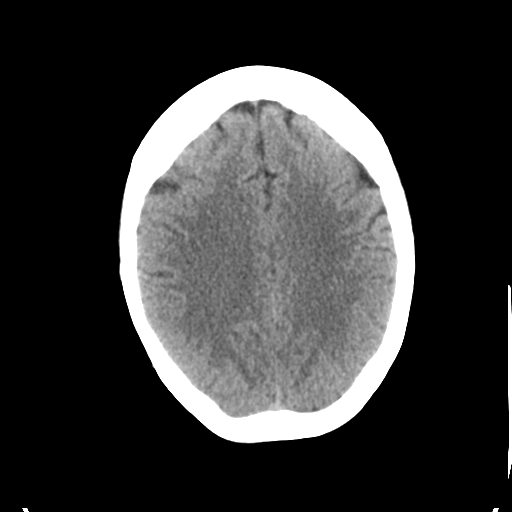
[im 23/29  brain]
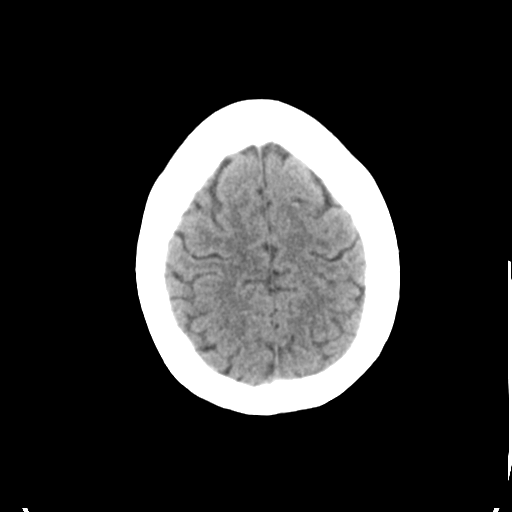
[im 27/29  brain]
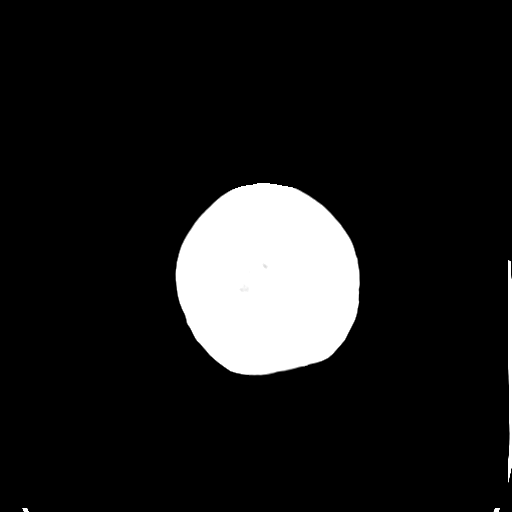

[Series 4: coronal · coronal · 0.26mm/px · 3 of 58 slices shown]
[im 20/58  brain]
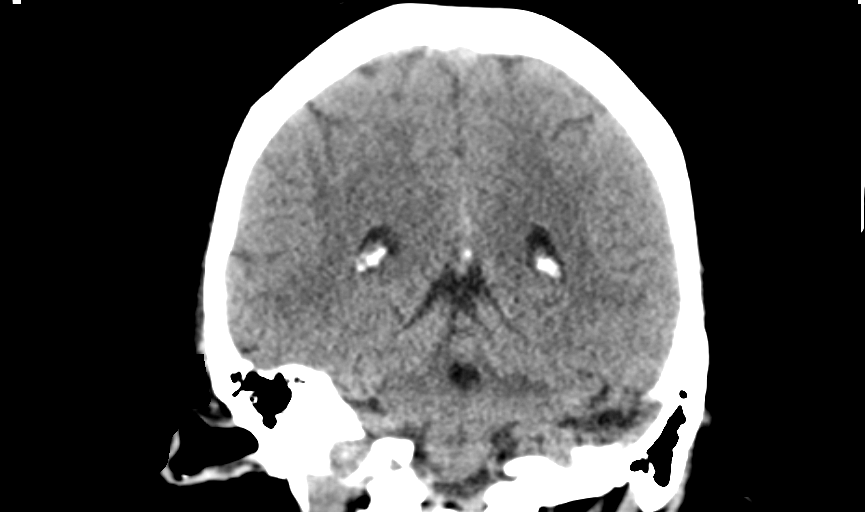
[im 26/58  brain]
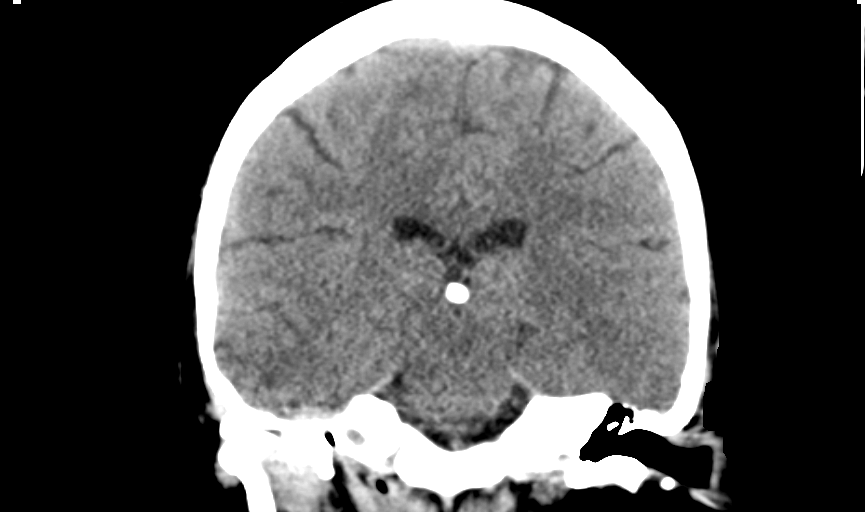
[im 32/58  brain]
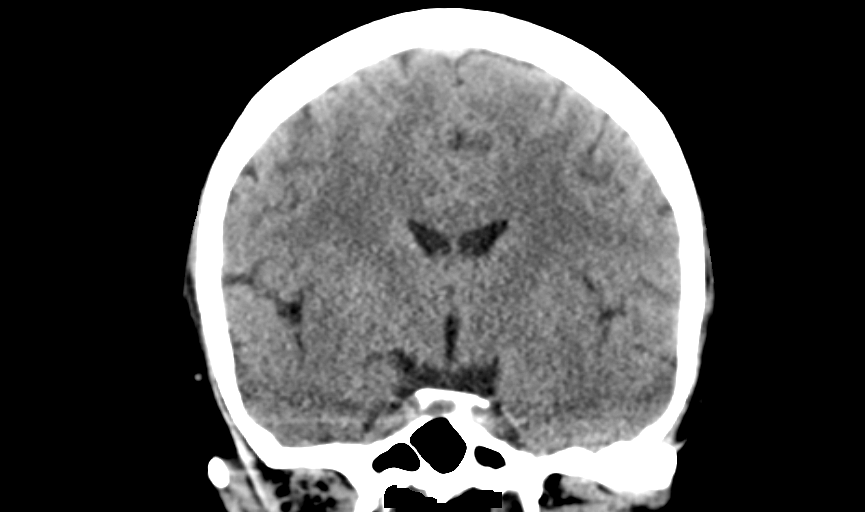

[Series 5: sagittal · sagittal · 0.26mm/px · 3 of 73 slices shown]
[im 25/73  brain]
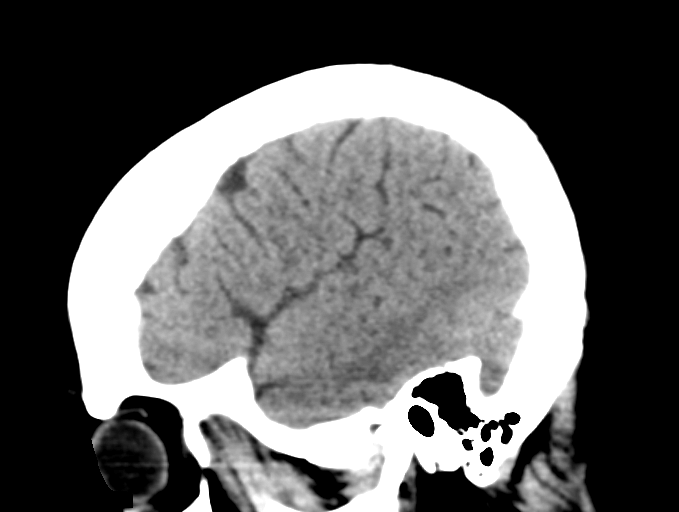
[im 37/73  brain]
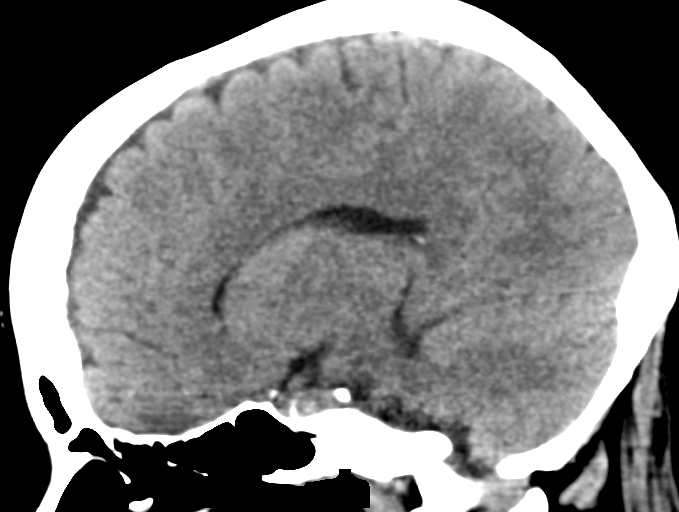
[im 49/73  brain]
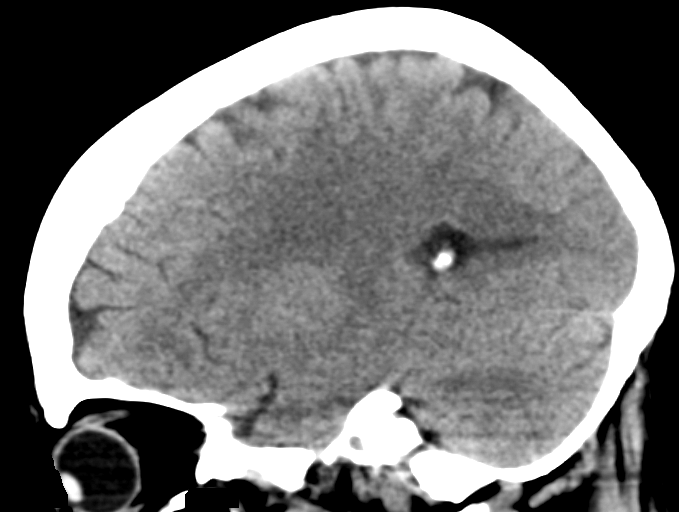

[14 of 47 positions shown; findings below may reference images not displayed]

FINDINGS: INTRACRANIAL CONTENTS: The ventricles and sulci are normal. No
intraparenchymal hemorrhage, mass effect nor midline shift. No acute
large vascular territory infarcts. No abnormal extra-axial fluid
collections. Stable punctate calcifications bilateral basal ganglia.
Basal cisterns are patent. No hyperdense MCA or insular ribbon sign.

ORBITS: The included ocular globes and orbital contents are normal.

SINUSES: The mastoid aircells and included paranasal sinuses are
well-aerated.

SKULL/SOFT TISSUES: No skull fracture. No significant soft tissue
swelling. Fatty replaced parotid glands.
IMPRESSION: Negative CT HEAD.

Acute findings discussed with and reconfirmed by Dr.RATOWNIK DORNA on
07/25/2015 at [DATE].

## 2018-05-23 DIAGNOSIS — M5136 Other intervertebral disc degeneration, lumbar region: Secondary | ICD-10-CM | POA: Diagnosis not present

## 2018-05-23 DIAGNOSIS — M542 Cervicalgia: Secondary | ICD-10-CM | POA: Diagnosis not present

## 2018-05-23 DIAGNOSIS — M25552 Pain in left hip: Secondary | ICD-10-CM | POA: Diagnosis not present

## 2018-05-23 DIAGNOSIS — M9901 Segmental and somatic dysfunction of cervical region: Secondary | ICD-10-CM | POA: Diagnosis not present

## 2018-05-23 DIAGNOSIS — M5382 Other specified dorsopathies, cervical region: Secondary | ICD-10-CM | POA: Diagnosis not present

## 2018-05-23 DIAGNOSIS — M5442 Lumbago with sciatica, left side: Secondary | ICD-10-CM | POA: Diagnosis not present

## 2018-05-23 DIAGNOSIS — M50323 Other cervical disc degeneration at C6-C7 level: Secondary | ICD-10-CM | POA: Diagnosis not present

## 2018-05-23 DIAGNOSIS — M9906 Segmental and somatic dysfunction of lower extremity: Secondary | ICD-10-CM | POA: Diagnosis not present

## 2018-05-23 DIAGNOSIS — M9903 Segmental and somatic dysfunction of lumbar region: Secondary | ICD-10-CM | POA: Diagnosis not present

## 2018-05-25 DIAGNOSIS — M25552 Pain in left hip: Secondary | ICD-10-CM | POA: Diagnosis not present

## 2018-05-25 DIAGNOSIS — M50323 Other cervical disc degeneration at C6-C7 level: Secondary | ICD-10-CM | POA: Diagnosis not present

## 2018-05-25 DIAGNOSIS — M5382 Other specified dorsopathies, cervical region: Secondary | ICD-10-CM | POA: Diagnosis not present

## 2018-05-25 DIAGNOSIS — M5442 Lumbago with sciatica, left side: Secondary | ICD-10-CM | POA: Diagnosis not present

## 2018-05-25 DIAGNOSIS — M542 Cervicalgia: Secondary | ICD-10-CM | POA: Diagnosis not present

## 2018-05-25 DIAGNOSIS — M9906 Segmental and somatic dysfunction of lower extremity: Secondary | ICD-10-CM | POA: Diagnosis not present

## 2018-05-25 DIAGNOSIS — M5136 Other intervertebral disc degeneration, lumbar region: Secondary | ICD-10-CM | POA: Diagnosis not present

## 2018-05-25 DIAGNOSIS — M9901 Segmental and somatic dysfunction of cervical region: Secondary | ICD-10-CM | POA: Diagnosis not present

## 2018-05-25 DIAGNOSIS — M9903 Segmental and somatic dysfunction of lumbar region: Secondary | ICD-10-CM | POA: Diagnosis not present

## 2018-05-27 DIAGNOSIS — M5382 Other specified dorsopathies, cervical region: Secondary | ICD-10-CM | POA: Diagnosis not present

## 2018-05-27 DIAGNOSIS — M542 Cervicalgia: Secondary | ICD-10-CM | POA: Diagnosis not present

## 2018-05-27 DIAGNOSIS — M25552 Pain in left hip: Secondary | ICD-10-CM | POA: Diagnosis not present

## 2018-05-27 DIAGNOSIS — M50323 Other cervical disc degeneration at C6-C7 level: Secondary | ICD-10-CM | POA: Diagnosis not present

## 2018-05-27 DIAGNOSIS — M5136 Other intervertebral disc degeneration, lumbar region: Secondary | ICD-10-CM | POA: Diagnosis not present

## 2018-05-27 DIAGNOSIS — M9903 Segmental and somatic dysfunction of lumbar region: Secondary | ICD-10-CM | POA: Diagnosis not present

## 2018-05-27 DIAGNOSIS — M9901 Segmental and somatic dysfunction of cervical region: Secondary | ICD-10-CM | POA: Diagnosis not present

## 2018-05-27 DIAGNOSIS — M9906 Segmental and somatic dysfunction of lower extremity: Secondary | ICD-10-CM | POA: Diagnosis not present

## 2018-05-27 DIAGNOSIS — M5442 Lumbago with sciatica, left side: Secondary | ICD-10-CM | POA: Diagnosis not present

## 2018-06-01 DIAGNOSIS — M9906 Segmental and somatic dysfunction of lower extremity: Secondary | ICD-10-CM | POA: Diagnosis not present

## 2018-06-01 DIAGNOSIS — M50323 Other cervical disc degeneration at C6-C7 level: Secondary | ICD-10-CM | POA: Diagnosis not present

## 2018-06-01 DIAGNOSIS — M9903 Segmental and somatic dysfunction of lumbar region: Secondary | ICD-10-CM | POA: Diagnosis not present

## 2018-06-01 DIAGNOSIS — M5382 Other specified dorsopathies, cervical region: Secondary | ICD-10-CM | POA: Diagnosis not present

## 2018-06-01 DIAGNOSIS — M9901 Segmental and somatic dysfunction of cervical region: Secondary | ICD-10-CM | POA: Diagnosis not present

## 2018-06-01 DIAGNOSIS — M5442 Lumbago with sciatica, left side: Secondary | ICD-10-CM | POA: Diagnosis not present

## 2018-06-01 DIAGNOSIS — M5136 Other intervertebral disc degeneration, lumbar region: Secondary | ICD-10-CM | POA: Diagnosis not present

## 2018-06-01 DIAGNOSIS — M542 Cervicalgia: Secondary | ICD-10-CM | POA: Diagnosis not present

## 2018-06-01 DIAGNOSIS — M25552 Pain in left hip: Secondary | ICD-10-CM | POA: Diagnosis not present

## 2018-06-03 DIAGNOSIS — M9903 Segmental and somatic dysfunction of lumbar region: Secondary | ICD-10-CM | POA: Diagnosis not present

## 2018-06-03 DIAGNOSIS — M542 Cervicalgia: Secondary | ICD-10-CM | POA: Diagnosis not present

## 2018-06-03 DIAGNOSIS — M5382 Other specified dorsopathies, cervical region: Secondary | ICD-10-CM | POA: Diagnosis not present

## 2018-06-03 DIAGNOSIS — M25552 Pain in left hip: Secondary | ICD-10-CM | POA: Diagnosis not present

## 2018-06-03 DIAGNOSIS — M5136 Other intervertebral disc degeneration, lumbar region: Secondary | ICD-10-CM | POA: Diagnosis not present

## 2018-06-03 DIAGNOSIS — M5442 Lumbago with sciatica, left side: Secondary | ICD-10-CM | POA: Diagnosis not present

## 2018-06-03 DIAGNOSIS — M50323 Other cervical disc degeneration at C6-C7 level: Secondary | ICD-10-CM | POA: Diagnosis not present

## 2018-06-03 DIAGNOSIS — M9901 Segmental and somatic dysfunction of cervical region: Secondary | ICD-10-CM | POA: Diagnosis not present

## 2018-06-03 DIAGNOSIS — M9906 Segmental and somatic dysfunction of lower extremity: Secondary | ICD-10-CM | POA: Diagnosis not present

## 2018-06-08 ENCOUNTER — Ambulatory Visit: Payer: Medicare HMO | Admitting: Sports Medicine

## 2018-06-09 DIAGNOSIS — M9901 Segmental and somatic dysfunction of cervical region: Secondary | ICD-10-CM | POA: Diagnosis not present

## 2018-06-09 DIAGNOSIS — M9906 Segmental and somatic dysfunction of lower extremity: Secondary | ICD-10-CM | POA: Diagnosis not present

## 2018-06-09 DIAGNOSIS — M25552 Pain in left hip: Secondary | ICD-10-CM | POA: Diagnosis not present

## 2018-06-09 DIAGNOSIS — M5442 Lumbago with sciatica, left side: Secondary | ICD-10-CM | POA: Diagnosis not present

## 2018-06-09 DIAGNOSIS — M50323 Other cervical disc degeneration at C6-C7 level: Secondary | ICD-10-CM | POA: Diagnosis not present

## 2018-06-09 DIAGNOSIS — M5382 Other specified dorsopathies, cervical region: Secondary | ICD-10-CM | POA: Diagnosis not present

## 2018-06-09 DIAGNOSIS — M542 Cervicalgia: Secondary | ICD-10-CM | POA: Diagnosis not present

## 2018-06-09 DIAGNOSIS — M5136 Other intervertebral disc degeneration, lumbar region: Secondary | ICD-10-CM | POA: Diagnosis not present

## 2018-06-09 DIAGNOSIS — M9903 Segmental and somatic dysfunction of lumbar region: Secondary | ICD-10-CM | POA: Diagnosis not present

## 2018-06-13 DIAGNOSIS — M5136 Other intervertebral disc degeneration, lumbar region: Secondary | ICD-10-CM | POA: Diagnosis not present

## 2018-06-13 DIAGNOSIS — M25552 Pain in left hip: Secondary | ICD-10-CM | POA: Diagnosis not present

## 2018-06-13 DIAGNOSIS — M542 Cervicalgia: Secondary | ICD-10-CM | POA: Diagnosis not present

## 2018-06-13 DIAGNOSIS — M9906 Segmental and somatic dysfunction of lower extremity: Secondary | ICD-10-CM | POA: Diagnosis not present

## 2018-06-13 DIAGNOSIS — M9901 Segmental and somatic dysfunction of cervical region: Secondary | ICD-10-CM | POA: Diagnosis not present

## 2018-06-13 DIAGNOSIS — M50323 Other cervical disc degeneration at C6-C7 level: Secondary | ICD-10-CM | POA: Diagnosis not present

## 2018-06-13 DIAGNOSIS — M5382 Other specified dorsopathies, cervical region: Secondary | ICD-10-CM | POA: Diagnosis not present

## 2018-06-13 DIAGNOSIS — M5442 Lumbago with sciatica, left side: Secondary | ICD-10-CM | POA: Diagnosis not present

## 2018-06-13 DIAGNOSIS — M9903 Segmental and somatic dysfunction of lumbar region: Secondary | ICD-10-CM | POA: Diagnosis not present

## 2018-06-16 ENCOUNTER — Other Ambulatory Visit: Payer: Self-pay

## 2018-06-16 ENCOUNTER — Ambulatory Visit (INDEPENDENT_AMBULATORY_CARE_PROVIDER_SITE_OTHER): Payer: Medicare HMO | Admitting: Sports Medicine

## 2018-06-16 ENCOUNTER — Encounter: Payer: Self-pay | Admitting: Sports Medicine

## 2018-06-16 VITALS — Temp 97.4°F | Resp 16

## 2018-06-16 DIAGNOSIS — M5442 Lumbago with sciatica, left side: Secondary | ICD-10-CM | POA: Diagnosis not present

## 2018-06-16 DIAGNOSIS — E349 Endocrine disorder, unspecified: Secondary | ICD-10-CM

## 2018-06-16 DIAGNOSIS — M2021 Hallux rigidus, right foot: Secondary | ICD-10-CM

## 2018-06-16 DIAGNOSIS — M2041 Other hammer toe(s) (acquired), right foot: Secondary | ICD-10-CM

## 2018-06-16 DIAGNOSIS — B351 Tinea unguium: Secondary | ICD-10-CM | POA: Diagnosis not present

## 2018-06-16 DIAGNOSIS — M50323 Other cervical disc degeneration at C6-C7 level: Secondary | ICD-10-CM | POA: Diagnosis not present

## 2018-06-16 DIAGNOSIS — M205X1 Other deformities of toe(s) (acquired), right foot: Secondary | ICD-10-CM

## 2018-06-16 DIAGNOSIS — M79609 Pain in unspecified limb: Secondary | ICD-10-CM | POA: Diagnosis not present

## 2018-06-16 DIAGNOSIS — M5382 Other specified dorsopathies, cervical region: Secondary | ICD-10-CM | POA: Diagnosis not present

## 2018-06-16 DIAGNOSIS — G63 Polyneuropathy in diseases classified elsewhere: Secondary | ICD-10-CM

## 2018-06-16 DIAGNOSIS — E669 Obesity, unspecified: Secondary | ICD-10-CM

## 2018-06-16 DIAGNOSIS — M9906 Segmental and somatic dysfunction of lower extremity: Secondary | ICD-10-CM | POA: Diagnosis not present

## 2018-06-16 DIAGNOSIS — E1169 Type 2 diabetes mellitus with other specified complication: Secondary | ICD-10-CM

## 2018-06-16 DIAGNOSIS — M9901 Segmental and somatic dysfunction of cervical region: Secondary | ICD-10-CM | POA: Diagnosis not present

## 2018-06-16 DIAGNOSIS — M5136 Other intervertebral disc degeneration, lumbar region: Secondary | ICD-10-CM | POA: Diagnosis not present

## 2018-06-16 DIAGNOSIS — M25552 Pain in left hip: Secondary | ICD-10-CM | POA: Diagnosis not present

## 2018-06-16 DIAGNOSIS — M9903 Segmental and somatic dysfunction of lumbar region: Secondary | ICD-10-CM | POA: Diagnosis not present

## 2018-06-16 DIAGNOSIS — M542 Cervicalgia: Secondary | ICD-10-CM | POA: Diagnosis not present

## 2018-06-16 NOTE — Progress Notes (Signed)
Subjective: Melanie Meyers is a 63 y.o. female patient with history of diabetes who presents to office today complaining of long,mildly painful nails  while ambulating in shoes; unable to trim. Patient reports that she also desires diabetic shoes.  Patient states that the glucose reading this morning was 83 mg/dl. Patient denies any new changes in medication or new problems, reports that she is still using metatarsal padding and taping toes occasionally with some relief of foot pain. Patient denies any new cramping, numbness, burning or tingling in the legs.  Reports that she saw her primary doctor on May 1 and will see again soon.  Patient denies any changes with medical history or any other problems at this time.  Patient Active Problem List   Diagnosis Date Noted  . CAD (coronary artery disease) 11/10/2017  . S/P cervical spinal fusion 10/06/2017  . Diabetes mellitus due to underlying condition with unspecified complications ([place]) 29/52/8413  . Pre-operative cardiovascular examination 09/21/2017  . Osteoarthritis of finger of right hand 02/10/2017  . Chronic tension-type headache, not intractable 07/10/2016  . Cellulitis of great toe, left 06/17/2016  . Diabetic peripheral neuropathy ([place]) 05/17/2016  . Transient alteration of awareness 10/10/2015  . Peripheral polyneuropathy 10/10/2015  . HTN (hypertension) 06/20/2015  . Hyperlipidemia 06/20/2015  . Morbid obesity ([place]) 06/20/2015  . Metatarsal deformity 05/30/2014  . Metatarsalgia 05/30/2014  . Neuropathy associated with endocrine disorder ([place]) 05/30/2014  . Hand paresthesia 06/27/2013  . Hypothyroidism 06/02/2013  . Type 2 diabetes mellitus ([place]) 06/02/2013  . 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 Nerve root pain 01/27/2013  . Narrowing of intervertebral disc space 01/27/2013  . Myofascial pain 01/27/2013  . Cervical spine pain 11/24/2012  . Cervical osteoarthritis 11/24/2012  . Carpal  tunnel syndrome 01/15/2012  . Asthma 01/20/2008   Current Outpatient Medications on File Prior to Visit  Medication Sig Dispense Refill  . ACCU-CHEK AVIVA PLUS test strip 1 each by Other route as needed (for BS).     Marland Kitchen albuterol (VENTOLIN HFA) 108 (90 Base) MCG/ACT inhaler Inhale 2 puffs into the lungs every 6 (six) hours as needed for wheezing or shortness of breath.     Marland Kitchen aspirin EC 81 MG tablet Take 81 mg by mouth at bedtime.     . Biotin 10000 MCG TABS Take 10,000 mcg by mouth at bedtime.    . budesonide-formoterol (SYMBICORT) 160-4.5 MCG/ACT inhaler Inhale two puffs twice daily with spacer to prevent cough or wheeze.  Rinse, gargle, and spit after use. 1 Inhaler 5  . cetirizine (ZYRTEC) 10 MG tablet Take 10 mg by mouth at bedtime.     Marland Kitchen co-enzyme Q-10 30 MG capsule Take 30 mg by mouth at bedtime.    . Colloidal Oatmeal (EUCERIN ECZEMA RELIEF EX) Eczema Moisturizing Cream    . diclofenac sodium (VOLTAREN) 1 % GEL diclofenac 1 % topical gel  APPLY 2 GRAM TO THE AFFECTED AREA(S) BY TOPICAL ROUTE 2-3 TIMES PER DAY    . esomeprazole (NEXIUM) 20 MG capsule TAKE 1 CAPSULE EVERY DAY 1 HOUR PRIOR TO A MEAL    . Insulin Glargine-Lixisenatide (SOLIQUA) 100-33 UNT-MCG/ML SOPN Inject 30 Units into the skin daily.    Marland Kitchen levothyroxine (SYNTHROID, LEVOTHROID) 25 MCG tablet Take 25 mcg by mouth daily before breakfast.    . lidocaine (LIDODERM) 5 %     . loratadine (CLARITIN) 10 MG tablet Take 10 mg by mouth at bedtime.     Marland Kitchen losartan (COZAAR)  25 MG tablet     . Magnesium 250 MG TABS Take 250 mg by mouth at bedtime.    . meclizine (ANTIVERT) 12.5 MG tablet 1 (ONE) TABLET EVERY FOUR SIX HOURS AS NEEDED FOR DIZZINESS    . Menthol, Topical Analgesic, (BIOFREEZE EX) Apply 1 application topically daily as needed (muscle pain).    . metFORMIN (GLUCOPHAGE) 1000 MG tablet Take 1 tablet (1,000 mg total) by mouth 2 (two) times daily with a meal.    . methocarbamol (ROBAXIN) 500 MG tablet Take 1 tablet (500 mg  total) by mouth 3 (three) times daily. (Patient taking differently: Take 500 mg by mouth 2 (two) times daily. ) 30 tablet 0  . mometasone (NASONEX) 50 MCG/ACT nasal spray Place 1 spray into the nose at bedtime.    . montelukast (SINGULAIR) 10 MG tablet Take 10 mg by mouth at bedtime.     . Multiple Vitamin (MULTIVITAMIN WITH MINERALS) TABS tablet Take 1 tablet by mouth at bedtime.    . Multiple Vitamins-Minerals (ANTIOXIDANT) CAPS Take 1 capsule by mouth at bedtime.    Marland Kitchen olmesartan (BENICAR) 20 MG tablet TAKE 1/2 TABLET BY MOUTH ONCE DAILY 30 tablet 3  . Omega-3 Fatty Acids (FISH OIL) 1000 MG CAPS Take 2,000 mg by mouth at bedtime.    . ondansetron (ZOFRAN ODT) 4 MG disintegrating tablet Take 1 tablet (4 mg total) by mouth every 8 (eight) hours as needed. 20 tablet 0  . polyethylene glycol (MIRALAX / GLYCOLAX) packet Take 17 g by mouth daily as needed for mild constipation.     . potassium gluconate (HM POTASSIUM) 595 (99 K) MG TABS tablet Take 595 mg by mouth daily as needed (cramps).    . pravastatin (PRAVACHOL) 40 MG tablet Take 40 mg by mouth daily.     . pregabalin (LYRICA) 150 MG capsule Take  1 cap in AM, 2 caps in PM and continue 270 capsule 3  . ranitidine (ZANTAC) 300 MG tablet TAKE 1 TABLET AT BEDTIME (Patient taking differently: Take 300 mg by mouth as needed. ) 90 tablet 0  . rizatriptan (MAXALT) 10 MG tablet Take 10 mg by mouth as needed for migraine.     . sertraline (ZOLOFT) 50 MG tablet Take 50 mg by mouth at bedtime.    Marland Kitchen UNIFINE PENTIPS 31G X 6 MM MISC     . zolpidem (AMBIEN) 10 MG tablet Take 10 mg by mouth at bedtime as needed for sleep.     . nitroGLYCERIN (NITROSTAT) 0.4 MG SL tablet Place 1 tablet (0.4 mg total) under the tongue every 5 (five) minutes as needed. (Patient taking differently: Place 0.4 mg under the tongue every 5 (five) minutes as needed for chest pain. ) 25 tablet 11   Current Facility-Administered Medications on File Prior to Visit  Medication Dose Route  Frequency Provider Last Rate Last Dose  . triamcinolone acetonide (KENALOG) 10 MG/ML injection 10 mg  10 mg Other Once Landis Martins, DPM      . triamcinolone acetonide (KENALOG) 10 MG/ML injection 10 mg  10 mg Other Once Landis Martins, DPM       Allergies  Allergen Reactions  . Accupril [Quinapril Hcl] Cough  . Prednisone Other (See Comments)    Has to be cautious due to Blood Sugar  . Levaquin [Levofloxacin] Rash    No results found for this or any previous visit (from the past 2160 hour(s)).  Objective: General: Patient is awake, alert, and oriented x 3 and in  no acute distress.  Integument: Skin is warm, dry and supple bilateral. Nails are tender, long, thickened and  dystrophic with subungual debris, consistent with onychomycosis, 1-5 bilateral. No signs of infection. No open lesions or preulcerative lesions present bilateral. Remaining integument unremarkable.  Vasculature:  Dorsalis Pedis pulse 1/4 bilateral. Posterior Tibial pulse  1/4 bilateral.  Capillary fill time <3 sec 1-5 bilateral. Positive hair growth to the level of the digits. Temperature gradient within normal limits. No varicosities present bilateral. No edema present bilateral.   Neurology: The patient has intact sensation measured with a 5.07/10g Semmes Weinstein Monofilament at all pedal sites bilateral . Vibratory sensation diminished bilateral with tuning fork. No Babinski sign present bilateral.   Musculoskeletal: Asymptomatic then intact first metatarsal phalangeal joint range of motion pedal deformities noted bilateral.  No residual pain to ball or plantar forefoot bilateral.  Early hammertoe deformity.  Pes planus foot type.  Muscular strength 5/5 in all lower extremity muscular groups bilateral without pain on range of motion . No tenderness with calf compression bilateral.  Assessment and Plan: Problem List Items Addressed This Visit      Nervous and Auditory   Neuropathy associated with endocrine  disorder ([place])    Other Visit Diagnoses    Pain due to onychomycosis of nail    -  Primary   Diabetes mellitus type 2 in obese (HCC)       Hallux limitus, acquired, right       Hammer toe of right foot          -Examined patient. -Discussed and educated patient on diabetic foot care, especially with  regards to the vascular, neurological and musculoskeletal systems.  -Stressed the importance of good glycemic control and the detriment of not  controlling glucose levels in relation to the foot. -Mechanically debrided all nails 1-5 bilateral using sterile nail nipper and filed with dremel without incident  [employer] diabetic shoe order form was completed; office to contact primary care for approval / certification;  Office to arrange shoe fitting and dispensing. -Answered all patient questions -Patient to return  in 3 months for at risk foot care -Patient advised to call the office if any problems or questions arise in the meantime.  Landis Martins, DPM

## 2018-06-20 DIAGNOSIS — M50323 Other cervical disc degeneration at C6-C7 level: Secondary | ICD-10-CM | POA: Diagnosis not present

## 2018-06-20 DIAGNOSIS — M5442 Lumbago with sciatica, left side: Secondary | ICD-10-CM | POA: Diagnosis not present

## 2018-06-20 DIAGNOSIS — M9906 Segmental and somatic dysfunction of lower extremity: Secondary | ICD-10-CM | POA: Diagnosis not present

## 2018-06-20 DIAGNOSIS — M9901 Segmental and somatic dysfunction of cervical region: Secondary | ICD-10-CM | POA: Diagnosis not present

## 2018-06-20 DIAGNOSIS — M5136 Other intervertebral disc degeneration, lumbar region: Secondary | ICD-10-CM | POA: Diagnosis not present

## 2018-06-20 DIAGNOSIS — M25552 Pain in left hip: Secondary | ICD-10-CM | POA: Diagnosis not present

## 2018-06-20 DIAGNOSIS — M9903 Segmental and somatic dysfunction of lumbar region: Secondary | ICD-10-CM | POA: Diagnosis not present

## 2018-06-20 DIAGNOSIS — M5382 Other specified dorsopathies, cervical region: Secondary | ICD-10-CM | POA: Diagnosis not present

## 2018-06-20 DIAGNOSIS — M542 Cervicalgia: Secondary | ICD-10-CM | POA: Diagnosis not present

## 2018-06-23 DIAGNOSIS — M9903 Segmental and somatic dysfunction of lumbar region: Secondary | ICD-10-CM | POA: Diagnosis not present

## 2018-06-23 DIAGNOSIS — M9901 Segmental and somatic dysfunction of cervical region: Secondary | ICD-10-CM | POA: Diagnosis not present

## 2018-06-23 DIAGNOSIS — M25552 Pain in left hip: Secondary | ICD-10-CM | POA: Diagnosis not present

## 2018-06-23 DIAGNOSIS — M5382 Other specified dorsopathies, cervical region: Secondary | ICD-10-CM | POA: Diagnosis not present

## 2018-06-23 DIAGNOSIS — M9902 Segmental and somatic dysfunction of thoracic region: Secondary | ICD-10-CM | POA: Diagnosis not present

## 2018-06-23 DIAGNOSIS — M5136 Other intervertebral disc degeneration, lumbar region: Secondary | ICD-10-CM | POA: Diagnosis not present

## 2018-06-23 DIAGNOSIS — M546 Pain in thoracic spine: Secondary | ICD-10-CM | POA: Diagnosis not present

## 2018-06-23 DIAGNOSIS — M9906 Segmental and somatic dysfunction of lower extremity: Secondary | ICD-10-CM | POA: Diagnosis not present

## 2018-06-23 DIAGNOSIS — M50323 Other cervical disc degeneration at C6-C7 level: Secondary | ICD-10-CM | POA: Diagnosis not present

## 2018-06-27 DIAGNOSIS — Z9013 Acquired absence of bilateral breasts and nipples: Secondary | ICD-10-CM | POA: Diagnosis not present

## 2018-06-29 DIAGNOSIS — M5382 Other specified dorsopathies, cervical region: Secondary | ICD-10-CM | POA: Diagnosis not present

## 2018-06-29 DIAGNOSIS — M9903 Segmental and somatic dysfunction of lumbar region: Secondary | ICD-10-CM | POA: Diagnosis not present

## 2018-06-29 DIAGNOSIS — M9906 Segmental and somatic dysfunction of lower extremity: Secondary | ICD-10-CM | POA: Diagnosis not present

## 2018-06-29 DIAGNOSIS — M5136 Other intervertebral disc degeneration, lumbar region: Secondary | ICD-10-CM | POA: Diagnosis not present

## 2018-06-29 DIAGNOSIS — M9901 Segmental and somatic dysfunction of cervical region: Secondary | ICD-10-CM | POA: Diagnosis not present

## 2018-06-29 DIAGNOSIS — M9902 Segmental and somatic dysfunction of thoracic region: Secondary | ICD-10-CM | POA: Diagnosis not present

## 2018-06-29 DIAGNOSIS — M546 Pain in thoracic spine: Secondary | ICD-10-CM | POA: Diagnosis not present

## 2018-06-29 DIAGNOSIS — M25552 Pain in left hip: Secondary | ICD-10-CM | POA: Diagnosis not present

## 2018-06-29 DIAGNOSIS — M50323 Other cervical disc degeneration at C6-C7 level: Secondary | ICD-10-CM | POA: Diagnosis not present

## 2018-07-06 DIAGNOSIS — M9906 Segmental and somatic dysfunction of lower extremity: Secondary | ICD-10-CM | POA: Diagnosis not present

## 2018-07-06 DIAGNOSIS — M25552 Pain in left hip: Secondary | ICD-10-CM | POA: Diagnosis not present

## 2018-07-06 DIAGNOSIS — M50323 Other cervical disc degeneration at C6-C7 level: Secondary | ICD-10-CM | POA: Diagnosis not present

## 2018-07-06 DIAGNOSIS — M9903 Segmental and somatic dysfunction of lumbar region: Secondary | ICD-10-CM | POA: Diagnosis not present

## 2018-07-06 DIAGNOSIS — M9902 Segmental and somatic dysfunction of thoracic region: Secondary | ICD-10-CM | POA: Diagnosis not present

## 2018-07-06 DIAGNOSIS — M546 Pain in thoracic spine: Secondary | ICD-10-CM | POA: Diagnosis not present

## 2018-07-06 DIAGNOSIS — M9901 Segmental and somatic dysfunction of cervical region: Secondary | ICD-10-CM | POA: Diagnosis not present

## 2018-07-06 DIAGNOSIS — M5382 Other specified dorsopathies, cervical region: Secondary | ICD-10-CM | POA: Diagnosis not present

## 2018-07-06 DIAGNOSIS — M5136 Other intervertebral disc degeneration, lumbar region: Secondary | ICD-10-CM | POA: Diagnosis not present

## 2018-07-13 DIAGNOSIS — M5136 Other intervertebral disc degeneration, lumbar region: Secondary | ICD-10-CM | POA: Diagnosis not present

## 2018-07-13 DIAGNOSIS — M9906 Segmental and somatic dysfunction of lower extremity: Secondary | ICD-10-CM | POA: Diagnosis not present

## 2018-07-13 DIAGNOSIS — M9903 Segmental and somatic dysfunction of lumbar region: Secondary | ICD-10-CM | POA: Diagnosis not present

## 2018-07-13 DIAGNOSIS — M9901 Segmental and somatic dysfunction of cervical region: Secondary | ICD-10-CM | POA: Diagnosis not present

## 2018-07-13 DIAGNOSIS — M50323 Other cervical disc degeneration at C6-C7 level: Secondary | ICD-10-CM | POA: Diagnosis not present

## 2018-07-13 DIAGNOSIS — M5382 Other specified dorsopathies, cervical region: Secondary | ICD-10-CM | POA: Diagnosis not present

## 2018-07-13 DIAGNOSIS — M546 Pain in thoracic spine: Secondary | ICD-10-CM | POA: Diagnosis not present

## 2018-07-13 DIAGNOSIS — M9902 Segmental and somatic dysfunction of thoracic region: Secondary | ICD-10-CM | POA: Diagnosis not present

## 2018-07-13 DIAGNOSIS — M25552 Pain in left hip: Secondary | ICD-10-CM | POA: Diagnosis not present

## 2018-07-20 DIAGNOSIS — M25552 Pain in left hip: Secondary | ICD-10-CM | POA: Diagnosis not present

## 2018-07-20 DIAGNOSIS — M50323 Other cervical disc degeneration at C6-C7 level: Secondary | ICD-10-CM | POA: Diagnosis not present

## 2018-07-20 DIAGNOSIS — M9901 Segmental and somatic dysfunction of cervical region: Secondary | ICD-10-CM | POA: Diagnosis not present

## 2018-07-20 DIAGNOSIS — M5382 Other specified dorsopathies, cervical region: Secondary | ICD-10-CM | POA: Diagnosis not present

## 2018-07-20 DIAGNOSIS — M546 Pain in thoracic spine: Secondary | ICD-10-CM | POA: Diagnosis not present

## 2018-07-20 DIAGNOSIS — M9906 Segmental and somatic dysfunction of lower extremity: Secondary | ICD-10-CM | POA: Diagnosis not present

## 2018-07-20 DIAGNOSIS — M9902 Segmental and somatic dysfunction of thoracic region: Secondary | ICD-10-CM | POA: Diagnosis not present

## 2018-07-20 DIAGNOSIS — M9903 Segmental and somatic dysfunction of lumbar region: Secondary | ICD-10-CM | POA: Diagnosis not present

## 2018-07-20 DIAGNOSIS — M5136 Other intervertebral disc degeneration, lumbar region: Secondary | ICD-10-CM | POA: Diagnosis not present

## 2018-07-28 DIAGNOSIS — G4733 Obstructive sleep apnea (adult) (pediatric): Secondary | ICD-10-CM | POA: Diagnosis not present

## 2018-07-28 DIAGNOSIS — L989 Disorder of the skin and subcutaneous tissue, unspecified: Secondary | ICD-10-CM | POA: Diagnosis not present

## 2018-07-30 ENCOUNTER — Other Ambulatory Visit: Payer: Self-pay | Admitting: Cardiology

## 2018-08-01 ENCOUNTER — Telehealth: Payer: Self-pay

## 2018-08-01 NOTE — Telephone Encounter (Signed)
Left message for patient to call back and schedule overdue 6 mo f/u with Dr. Bettina Gavia

## 2018-08-05 DIAGNOSIS — M5382 Other specified dorsopathies, cervical region: Secondary | ICD-10-CM | POA: Diagnosis not present

## 2018-08-05 DIAGNOSIS — M9901 Segmental and somatic dysfunction of cervical region: Secondary | ICD-10-CM | POA: Diagnosis not present

## 2018-08-05 DIAGNOSIS — M50323 Other cervical disc degeneration at C6-C7 level: Secondary | ICD-10-CM | POA: Diagnosis not present

## 2018-08-05 DIAGNOSIS — M25552 Pain in left hip: Secondary | ICD-10-CM | POA: Diagnosis not present

## 2018-08-05 DIAGNOSIS — M9902 Segmental and somatic dysfunction of thoracic region: Secondary | ICD-10-CM | POA: Diagnosis not present

## 2018-08-05 DIAGNOSIS — M546 Pain in thoracic spine: Secondary | ICD-10-CM | POA: Diagnosis not present

## 2018-08-05 DIAGNOSIS — M9906 Segmental and somatic dysfunction of lower extremity: Secondary | ICD-10-CM | POA: Diagnosis not present

## 2018-08-05 DIAGNOSIS — M5136 Other intervertebral disc degeneration, lumbar region: Secondary | ICD-10-CM | POA: Diagnosis not present

## 2018-08-05 DIAGNOSIS — M9903 Segmental and somatic dysfunction of lumbar region: Secondary | ICD-10-CM | POA: Diagnosis not present

## 2018-08-08 DIAGNOSIS — D485 Neoplasm of uncertain behavior of skin: Secondary | ICD-10-CM | POA: Diagnosis not present

## 2018-08-10 ENCOUNTER — Other Ambulatory Visit: Payer: Self-pay

## 2018-08-10 ENCOUNTER — Ambulatory Visit: Payer: Medicare HMO | Admitting: Orthotics

## 2018-08-10 DIAGNOSIS — E349 Endocrine disorder, unspecified: Secondary | ICD-10-CM

## 2018-08-10 DIAGNOSIS — M9906 Segmental and somatic dysfunction of lower extremity: Secondary | ICD-10-CM | POA: Diagnosis not present

## 2018-08-10 DIAGNOSIS — L84 Corns and callosities: Secondary | ICD-10-CM | POA: Diagnosis not present

## 2018-08-10 DIAGNOSIS — M5382 Other specified dorsopathies, cervical region: Secondary | ICD-10-CM | POA: Diagnosis not present

## 2018-08-10 DIAGNOSIS — E1140 Type 2 diabetes mellitus with diabetic neuropathy, unspecified: Secondary | ICD-10-CM | POA: Diagnosis not present

## 2018-08-10 DIAGNOSIS — M546 Pain in thoracic spine: Secondary | ICD-10-CM | POA: Diagnosis not present

## 2018-08-10 DIAGNOSIS — B351 Tinea unguium: Secondary | ICD-10-CM

## 2018-08-10 DIAGNOSIS — M9903 Segmental and somatic dysfunction of lumbar region: Secondary | ICD-10-CM | POA: Diagnosis not present

## 2018-08-10 DIAGNOSIS — M25552 Pain in left hip: Secondary | ICD-10-CM | POA: Diagnosis not present

## 2018-08-10 DIAGNOSIS — M9902 Segmental and somatic dysfunction of thoracic region: Secondary | ICD-10-CM | POA: Diagnosis not present

## 2018-08-10 DIAGNOSIS — G63 Polyneuropathy in diseases classified elsewhere: Secondary | ICD-10-CM

## 2018-08-10 DIAGNOSIS — M9901 Segmental and somatic dysfunction of cervical region: Secondary | ICD-10-CM | POA: Diagnosis not present

## 2018-08-10 DIAGNOSIS — M205X1 Other deformities of toe(s) (acquired), right foot: Secondary | ICD-10-CM

## 2018-08-10 DIAGNOSIS — M50323 Other cervical disc degeneration at C6-C7 level: Secondary | ICD-10-CM | POA: Diagnosis not present

## 2018-08-10 DIAGNOSIS — M2041 Other hammer toe(s) (acquired), right foot: Secondary | ICD-10-CM

## 2018-08-10 DIAGNOSIS — M5136 Other intervertebral disc degeneration, lumbar region: Secondary | ICD-10-CM | POA: Diagnosis not present

## 2018-08-10 NOTE — Progress Notes (Signed)
Being seen by PA, however I cast her and she picked out shoes.   Will place order when she is seen by MD

## 2018-08-17 DIAGNOSIS — M25552 Pain in left hip: Secondary | ICD-10-CM | POA: Diagnosis not present

## 2018-08-17 DIAGNOSIS — M546 Pain in thoracic spine: Secondary | ICD-10-CM | POA: Diagnosis not present

## 2018-08-17 DIAGNOSIS — M50323 Other cervical disc degeneration at C6-C7 level: Secondary | ICD-10-CM | POA: Diagnosis not present

## 2018-08-17 DIAGNOSIS — M9903 Segmental and somatic dysfunction of lumbar region: Secondary | ICD-10-CM | POA: Diagnosis not present

## 2018-08-17 DIAGNOSIS — M9906 Segmental and somatic dysfunction of lower extremity: Secondary | ICD-10-CM | POA: Diagnosis not present

## 2018-08-17 DIAGNOSIS — M5382 Other specified dorsopathies, cervical region: Secondary | ICD-10-CM | POA: Diagnosis not present

## 2018-08-17 DIAGNOSIS — M9902 Segmental and somatic dysfunction of thoracic region: Secondary | ICD-10-CM | POA: Diagnosis not present

## 2018-08-17 DIAGNOSIS — M9901 Segmental and somatic dysfunction of cervical region: Secondary | ICD-10-CM | POA: Diagnosis not present

## 2018-08-17 DIAGNOSIS — M5136 Other intervertebral disc degeneration, lumbar region: Secondary | ICD-10-CM | POA: Diagnosis not present

## 2018-08-18 DIAGNOSIS — S20219A Contusion of unspecified front wall of thorax, initial encounter: Secondary | ICD-10-CM | POA: Diagnosis not present

## 2018-08-18 DIAGNOSIS — S299XXA Unspecified injury of thorax, initial encounter: Secondary | ICD-10-CM | POA: Diagnosis not present

## 2018-08-18 DIAGNOSIS — S301XXA Contusion of abdominal wall, initial encounter: Secondary | ICD-10-CM | POA: Diagnosis not present

## 2018-08-18 DIAGNOSIS — M79671 Pain in right foot: Secondary | ICD-10-CM | POA: Diagnosis not present

## 2018-08-18 DIAGNOSIS — S99921A Unspecified injury of right foot, initial encounter: Secondary | ICD-10-CM | POA: Diagnosis not present

## 2018-08-18 DIAGNOSIS — R079 Chest pain, unspecified: Secondary | ICD-10-CM | POA: Diagnosis not present
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
## 2018-08-24 ENCOUNTER — Ambulatory Visit (INDEPENDENT_AMBULATORY_CARE_PROVIDER_SITE_OTHER): Payer: Medicare HMO | Admitting: Cardiology

## 2018-08-24 ENCOUNTER — Other Ambulatory Visit: Payer: Self-pay

## 2018-08-24 ENCOUNTER — Encounter: Payer: Self-pay | Admitting: Cardiology

## 2018-08-24 VITALS — BP 112/68 | HR 76 | Ht 60.0 in | Wt 215.2 lb

## 2018-08-24 DIAGNOSIS — I2510 Atherosclerotic heart disease of native coronary artery without angina pectoris: Secondary | ICD-10-CM | POA: Diagnosis not present

## 2018-08-24 DIAGNOSIS — E782 Mixed hyperlipidemia: Secondary | ICD-10-CM | POA: Diagnosis not present

## 2018-08-24 DIAGNOSIS — E088 Diabetes mellitus due to underlying condition with unspecified complications: Secondary | ICD-10-CM | POA: Diagnosis not present

## 2018-08-24 DIAGNOSIS — I10 Essential (primary) hypertension: Secondary | ICD-10-CM | POA: Diagnosis not present

## 2018-08-24 LAB — COMPREHENSIVE METABOLIC PANEL
ALT: 10 IU/L (ref 0–32)
AST: 21 IU/L (ref 0–40)
Albumin/Globulin Ratio: 1.6 (ref 1.2–2.2)
Albumin: 4.2 g/dL (ref 3.8–4.8)
Alkaline Phosphatase: 87 IU/L (ref 39–117)
BUN/Creatinine Ratio: 18 (ref 12–28)
BUN: 20 mg/dL (ref 8–27)
Bilirubin Total: 0.2 mg/dL (ref 0.0–1.2)
CO2: 22 mmol/L (ref 20–29)
Calcium: 9.7 mg/dL (ref 8.7–10.3)
Chloride: 103 mmol/L (ref 96–106)
Creatinine, Ser: 1.12 mg/dL — ABNORMAL HIGH (ref 0.57–1.00)
GFR calc Af Amer: 61 mL/min/{1.73_m2} (ref >59)
GFR calc non Af Amer: 53 mL/min/{1.73_m2} — ABNORMAL LOW (ref >59)
Globulin, Total: 2.6 g/dL (ref 1.5–4.5)
Glucose: 83 mg/dL (ref 65–99)
Potassium: 4.6 mmol/L (ref 3.5–5.2)
Sodium: 140 mmol/L (ref 134–144)
Total Protein: 6.8 g/dL (ref 6.0–8.5)

## 2018-08-24 LAB — LIPID PANEL
Chol/HDL Ratio: 3.7 ratio (ref 0.0–4.4)
Cholesterol, Total: 205 mg/dL — ABNORMAL HIGH (ref 100–199)
HDL: 55 mg/dL (ref >39)
LDL Calculated: 117 mg/dL — ABNORMAL HIGH (ref 0–99)
Triglycerides: 165 mg/dL — ABNORMAL HIGH (ref 0–149)
VLDL Cholesterol Cal: 33 mg/dL (ref 5–40)

## 2018-08-24 MED ORDER — METOPROLOL SUCCINATE 25 MG PO CS24: 25.0000 mg | EXTENDED_RELEASE_CAPSULE | Freq: Every day | ORAL | 1 refills | Status: DC

## 2018-08-24 NOTE — Patient Instructions (Signed)
Medication Instructions:  Your physician has recommended you make the following change in your medication:   START ToprolXL 25mg  (one tablet) daily.  This is a beta-blocker which helps your heart to relax and is guideline directed therapy in those with nonobstructive coronary artery disease.   If you need a refill on your cardiac medications before your next appointment, please call your pharmacy.   Lab work: Your physician recommends that you return for lab work today: Lipid profile and CMP  If you have labs (blood work) drawn today and your tests are completely normal, you will receive your results only by: Marland Kitchen MyChart Message (if you have MyChart) OR . A paper copy in the mail If you have any lab test that is abnormal or we need to change your treatment, we will call you to review the results.  Testing/Procedures: None ordered today.  Follow-Up: At [facility], you and your health needs are our priority.  As part of our continuing mission to provide you with exceptional heart care, we have created designated Provider Care Teams.  These Care Teams include your primary Cardiologist (physician) and Advanced Practice Providers (APPs -  Physician Assistants and Nurse Practitioners) who all work together to provide you with the care you need, when you need it. . You will need a follow up appointment in 3 months.    Any Other Special Instructions Will Be Listed Below (If Applicable).  Take your nitroglycerin if needed for episode of chest pain/chest tightness. Be sure to sit down and rest when taking that medication.    Metoprolol extended-release capsules What is this medicine? METOPROLOL (me TOE proe lole) is a beta-blocker. Beta-blockers reduce the workload on the heart and help it to beat more regularly. This medicine is used to treat high blood pressure and to prevent chest pain. It is also used after a heart attack to prevent an additional heart attack from occurring. This medicine  may be used for other purposes; ask your health care provider or pharmacist if you have questions. COMMON BRAND NAME(S): KAPSPARGO What should I tell my health care provider before I take this medicine? They need to know if you have any of these conditions:  diabetes  heart disease  liver disease  lung or breathing disease, like asthma  pheochromocytoma  thyroid disease  an unusual or allergic reaction to metoprolol, other beta-blockers, medicines, foods, dyes, or preservatives  pregnant or trying to get pregnant  breast-feeding How should I use this medicine? Take this medicine by mouth. The capsules can be swallowed whole or opened carefully and the contents sprinkled over a small amount (teaspoonful) of soft food, such as applesauce, pudding, or yogurt. This mixture must be swallowed within 60 minutes and not stored for future use. Do not chew this medicine. You can take it with or without food. If it upsets your stomach, take it with food. Take your medicine at regular intervals. Do not take it more often than directed. Do not stop taking except on your doctor's advice. Talk to your pediatrician regarding the use of this medicine in children. While this drug may be prescribed for children as young as 6 for selected conditions, precautions do apply. Overdosage: If you think you have taken too much of this medicine contact a poison control center or emergency room at once. NOTE: This medicine is only for you. Do not share this medicine with others. What if I miss a dose? If you miss a dose, take it as soon as  you can. If it is almost time for your next dose, take only that dose. Do not take double or extra doses. What may interact with this medicine? This medicine may interact with the following medications:  certain medicines for blood pressure, heart disease, irregular heart beat  epinephrine  fluoxetine  MAOIs like Carbex, Eldepryl, Marplan, Nardil, and  Parnate  paroxetine  reserpine This list may not describe all possible interactions. Give your health care provider a list of all the medicines, herbs, non-prescription drugs, or dietary supplements you use. Also tell them if you smoke, drink alcohol, or use illegal drugs. Some items may interact with your medicine. What should I watch for while using this medicine? You may get drowsy or dizzy. Do not drive, use machinery, or do anything that needs mental alertness until you know how this medicine affects you. Do not stand or sit up quickly, especially if you are an older patient. This reduces the risk of dizzy or fainting spells. Alcohol may interfere with the effect of this medicine. Avoid alcoholic drinks. Visit your doctor or health care professional for regular checks on your progress. Check your blood pressure as directed. Ask your doctor or health care professional what your blood pressure should be and when you should contact him or her. Do not treat yourself for coughs, colds, or pain while you are using this medicine without asking your doctor or health care professional for advice. Some ingredients may increase your blood pressure. This medicine may increase blood sugar. Ask your healthcare provider if changes in diet or medicines are needed if you have diabetes. What side effects may I notice from receiving this medicine? Side effects that you should report to your doctor or health care professional as soon as possible:  allergic reactions like skin rash, itching or hives, swelling of the face, lips, or tongue  cold hands or feet  signs and symptoms of high blood sugar such as being more thirsty or hungry or having to urinate more than normal. You may also feel very tired or have blurry vision.  signs and symptoms of low blood pressure like dizziness; feeling faint or lightheaded, falls; unusually weak or tired  signs of worsening heart failure like breathing problems, swelling in  your legs and feet  suicidal thoughts or other mood changes  unusually slow heartbeat Side effects that usually do not require medical attention (report these to your doctor or health care professional if they continue or are bothersome):  anxious  change in sex drive or performance  diarrhea  headache  trouble sleeping  upset stomach This list may not describe all possible side effects. Call your doctor for medical advice about side effects. You may report side effects to FDA at 1-800-FDA-1088. Where should I keep my medicine? Keep out of the reach of children. Store at room temperature between 15 and 30 degrees C (59 and 86 degrees F). Throw away any unused medicine after the expiration date. NOTE: This sheet is a summary. It may not cover all possible information. If you have questions about this medicine, talk to your doctor, pharmacist, or health care provider.  2020 Elsevier/Gold Standard (2017-10-12 11:07:10)

## 2018-08-25 ENCOUNTER — Ambulatory Visit (INDEPENDENT_AMBULATORY_CARE_PROVIDER_SITE_OTHER): Payer: Medicare HMO | Admitting: Sports Medicine

## 2018-08-25 ENCOUNTER — Telehealth: Payer: Self-pay | Admitting: *Deleted

## 2018-08-25 ENCOUNTER — Encounter: Payer: Self-pay | Admitting: Sports Medicine

## 2018-08-25 ENCOUNTER — Other Ambulatory Visit: Payer: Self-pay

## 2018-08-25 DIAGNOSIS — G63 Polyneuropathy in diseases classified elsewhere: Secondary | ICD-10-CM

## 2018-08-25 DIAGNOSIS — E1169 Type 2 diabetes mellitus with other specified complication: Secondary | ICD-10-CM

## 2018-08-25 DIAGNOSIS — B351 Tinea unguium: Secondary | ICD-10-CM | POA: Diagnosis not present

## 2018-08-25 DIAGNOSIS — E782 Mixed hyperlipidemia: Secondary | ICD-10-CM

## 2018-08-25 DIAGNOSIS — S93601A Unspecified sprain of right foot, initial encounter: Secondary | ICD-10-CM

## 2018-08-25 DIAGNOSIS — E669 Obesity, unspecified: Secondary | ICD-10-CM | POA: Diagnosis not present

## 2018-08-25 DIAGNOSIS — E349 Endocrine disorder, unspecified: Secondary | ICD-10-CM

## 2018-08-25 DIAGNOSIS — M79609 Pain in unspecified limb: Secondary | ICD-10-CM | POA: Diagnosis not present

## 2018-08-25 NOTE — Telephone Encounter (Signed)
Patient informed of results and states that she has not been taking pravastatin as prescribed for the past 3-4 weeks. Spoke with Dr. Bettina Gavia who advised for patient to restart pravastatin 40 mg daily and recheck a lipid panel in 6 weeks. Patient is agreeable to plan and will return to our [place] office for lab work in 6 weeks, no appointment needed. Patient will come fasting. No further questions.
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
## 2018-08-25 NOTE — Telephone Encounter (Signed)
-----   Message from Richardo Priest, MD sent at 08/24/2018  5:40 PM EDT ----- Her LDL remains elevated, I would like her to start Zetia 10 mg along with pravastatin and we can check a lipid profile in 6 weeks

## 2018-08-30 ENCOUNTER — Other Ambulatory Visit: Payer: Self-pay | Admitting: Cardiology

## 2018-08-30 DIAGNOSIS — I2510 Atherosclerotic heart disease of native coronary artery without angina pectoris: Secondary | ICD-10-CM

## 2018-09-01 ENCOUNTER — Other Ambulatory Visit: Payer: Self-pay

## 2018-09-01 DIAGNOSIS — J301 Allergic rhinitis due to pollen: Secondary | ICD-10-CM | POA: Diagnosis not present

## 2018-09-01 DIAGNOSIS — E119 Type 2 diabetes mellitus without complications: Secondary | ICD-10-CM | POA: Diagnosis not present

## 2018-09-01 DIAGNOSIS — I209 Angina pectoris, unspecified: Secondary | ICD-10-CM | POA: Diagnosis not present

## 2018-09-01 DIAGNOSIS — R918 Other nonspecific abnormal finding of lung field: Secondary | ICD-10-CM | POA: Diagnosis not present

## 2018-09-01 DIAGNOSIS — D860 Sarcoidosis of lung: Secondary | ICD-10-CM | POA: Diagnosis not present

## 2018-09-01 DIAGNOSIS — G4733 Obstructive sleep apnea (adult) (pediatric): Secondary | ICD-10-CM | POA: Diagnosis not present

## 2018-09-01 MED ORDER — PREGABALIN 150 MG PO CAPS: ORAL_CAPSULE | ORAL | 3 refills | Status: DC

## 2018-09-02 ENCOUNTER — Telehealth: Payer: Self-pay | Admitting: Cardiology

## 2018-09-02 DIAGNOSIS — I2510 Atherosclerotic heart disease of native coronary artery without angina pectoris: Secondary | ICD-10-CM

## 2018-09-02 NOTE — Telephone Encounter (Signed)
Patient called and the [facility] Mail order does not want to cover the Metoprolol and wants them to call in [place]. Please call  Pt.

## 2018-09-05 ENCOUNTER — Other Ambulatory Visit: Payer: Self-pay

## 2018-09-05 ENCOUNTER — Other Ambulatory Visit: Payer: Self-pay | Admitting: Podiatry

## 2018-09-05 ENCOUNTER — Ambulatory Visit (INDEPENDENT_AMBULATORY_CARE_PROVIDER_SITE_OTHER): Payer: Medicare HMO | Admitting: Podiatry

## 2018-09-05 ENCOUNTER — Ambulatory Visit (INDEPENDENT_AMBULATORY_CARE_PROVIDER_SITE_OTHER): Payer: Medicare HMO

## 2018-09-05 ENCOUNTER — Encounter: Payer: Self-pay | Admitting: Podiatry

## 2018-09-05 VITALS — Temp 98.3°F | Resp 16

## 2018-09-05 DIAGNOSIS — R600 Localized edema: Secondary | ICD-10-CM | POA: Diagnosis not present

## 2018-09-05 DIAGNOSIS — M79671 Pain in right foot: Secondary | ICD-10-CM

## 2018-09-05 DIAGNOSIS — S9031XA Contusion of right foot, initial encounter: Secondary | ICD-10-CM

## 2018-09-05 MED ORDER — METOPROLOL SUCCINATE ER 25 MG PO TB24: 25.0000 mg | ORAL_TABLET | Freq: Every day | ORAL | 0 refills | Status: DC

## 2018-09-05 NOTE — Addendum Note (Signed)
Addended by: Stevan Born on: 09/05/2018 11:06 AM   Modules accepted: Orders
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
## 2018-09-05 NOTE — Telephone Encounter (Signed)
Spoke with patient regarding metoprolol being sent to [facility] order pharmacy.  Patient would prefer to continue taking metoprolol succ 25mg  one tablet daily.  She would like to try to send in Rx for metoprolol succ 25mg  one tablet daily to [facility].  If they will not refill it, then she will call back to send in metoprolol tartrate. Patient agreed to plan and verbalized understanding. No further questions.

## 2018-09-05 NOTE — Telephone Encounter (Signed)
Please call the patient the message does not make any sense.  If the question is the form of metoprolol she could substitute Lopressor 25 mg twice daily.

## 2018-09-07 DIAGNOSIS — E039 Hypothyroidism, unspecified: Secondary | ICD-10-CM | POA: Diagnosis not present

## 2018-09-07 DIAGNOSIS — E1149 Type 2 diabetes mellitus with other diabetic neurological complication: Secondary | ICD-10-CM | POA: Diagnosis not present

## 2018-09-07 DIAGNOSIS — F339 Major depressive disorder, recurrent, unspecified: Secondary | ICD-10-CM | POA: Diagnosis not present

## 2018-09-07 DIAGNOSIS — Z79899 Other long term (current) drug therapy: Secondary | ICD-10-CM | POA: Diagnosis not present

## 2018-09-07 DIAGNOSIS — E559 Vitamin D deficiency, unspecified: Secondary | ICD-10-CM | POA: Diagnosis not present

## 2018-09-07 DIAGNOSIS — F5104 Psychophysiologic insomnia: Secondary | ICD-10-CM | POA: Diagnosis not present

## 2018-09-07 DIAGNOSIS — I10 Essential (primary) hypertension: Secondary | ICD-10-CM | POA: Diagnosis not present

## 2018-09-07 DIAGNOSIS — E785 Hyperlipidemia, unspecified: Secondary | ICD-10-CM | POA: Diagnosis not present

## 2018-09-20 ENCOUNTER — Other Ambulatory Visit: Payer: Self-pay

## 2018-09-20 ENCOUNTER — Ambulatory Visit (INDEPENDENT_AMBULATORY_CARE_PROVIDER_SITE_OTHER): Payer: Medicare HMO | Admitting: Psychology

## 2018-09-20 ENCOUNTER — Ambulatory Visit: Payer: Medicare HMO | Admitting: Psychology

## 2018-09-20 ENCOUNTER — Encounter: Payer: Self-pay | Admitting: Psychology

## 2018-09-20 DIAGNOSIS — R4189 Other symptoms and signs involving cognitive functions and awareness: Secondary | ICD-10-CM | POA: Diagnosis not present

## 2018-09-20 DIAGNOSIS — G44229 Chronic tension-type headache, not intractable: Secondary | ICD-10-CM

## 2018-09-20 DIAGNOSIS — F339 Major depressive disorder, recurrent, unspecified: Secondary | ICD-10-CM | POA: Diagnosis not present

## 2018-09-20 NOTE — Progress Notes (Signed)
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 given the opportunity to ask questions and receive answers about the evaluation. Oral consent to participate was provided by the patient.   This evaluation was conducted by Christia Reading, Ph.D., licensed clinical neuropsychologist. Ms. Bjorkman completed a 20-minute clinical interview, billed as one unit [phone], and 135 minutes of cognitive testing, billed as one unit [phone] and four additional units [phone]. Psychometrist Cruzita Lederer, B.S., assisted Dr. Melvyn Novas with test administration and scoring procedures. As a separate and discrete service, Dr. Melvyn Novas spent a total of 180 minutes in interpretation and report writing, billed as  one unit [id number] and two units [id number].

## 2018-09-20 NOTE — Progress Notes (Signed)
   Neuropsychology Note   Melanie Meyers completed 120 minutes of neuropsychological testing with technician, Cruzita Lederer, B.S., under the supervision of Dr. Christia Reading, Ph.D., licensed neuropsychologist. The patient did not appear overtly distressed by the testing session, per behavioral observation or via self-report to the technician. Rest breaks were offered.    In considering the patient's current level of functioning, level of presumed impairment, nature of symptoms, emotional and behavioral responses during the interview, level of literacy, and observed level of motivation/effort, a battery of tests was selected and communicated to the psychometrician.   Communication between the psychologist and technician was ongoing throughout the testing session and changes were made as deemed necessary based on patient performance on testing, technician observations and additional pertinent factors such as those listed above.   Melanie Meyers will return within approximately two weeks for an interactive feedback session with Dr. Melvyn Novas at which time his test performances, clinical impressions, and treatment recommendations will be reviewed in detail. The patient understands she can contact our office should she require our assistance before this time.   Full report to follow.  120 minutes were spent face-to-face with patient administering standardized tests and 15 minutes were spent scoring (technician). [CPT [id number], [id number]

## 2018-09-21 ENCOUNTER — Encounter: Payer: Self-pay | Admitting: Psychology

## 2018-09-21 DIAGNOSIS — F339 Major depressive disorder, recurrent, unspecified: Secondary | ICD-10-CM | POA: Insufficient documentation

## 2018-09-21 DIAGNOSIS — M1711 Unilateral primary osteoarthritis, right knee: Secondary | ICD-10-CM | POA: Diagnosis not present

## 2018-09-21 DIAGNOSIS — M79671 Pain in right foot: Secondary | ICD-10-CM | POA: Diagnosis not present

## 2018-09-21 DIAGNOSIS — M25561 Pain in right knee: Secondary | ICD-10-CM | POA: Insufficient documentation

## 2018-09-27 ENCOUNTER — Ambulatory Visit (INDEPENDENT_AMBULATORY_CARE_PROVIDER_SITE_OTHER): Payer: Medicare HMO | Admitting: Psychology

## 2018-09-27 ENCOUNTER — Encounter: Payer: Self-pay | Admitting: Psychology

## 2018-09-27 ENCOUNTER — Other Ambulatory Visit: Payer: Self-pay

## 2018-09-27 DIAGNOSIS — F339 Major depressive disorder, recurrent, unspecified: Secondary | ICD-10-CM

## 2018-09-27 NOTE — Progress Notes (Signed)
   NEUROPSYCHOLOGICAL EVALUATION - Feedback [place] Department of Neurology  Reason for Referral:   Melanie Meyers a 63 y.o. Caucasian female referred by Ellouise Newer, M.D.,to characterize hercurrent cognitive functioning and assist with diagnostic clarity and treatment planning in the context of subjective cognitive decline and several medical comorbidities.  Feedback:   Melanie Meyers completed a comprehensive neuropsychological evaluation on 09/20/2018. Briefly, results suggested neuropsychological functioning within normal limits. Cognitive flexibility represented a relative weakness at times, along with processing speed and basic attention; however, scores largely remained within appropriate normative ranges. Factors which can create and maintain cognitive inefficiencies include her various medical comorbidities, especially those cardiovascular in nature, moderate levels of psychiatric distress (e.g., anxiety and depression) and ongoing psychosocial stressors, daily headaches, and report of largely non-restful sleep. It is likely that a combination of these factors are responsible for day-to-day cognitive difficulties which Melanie Meyers has been experiencing presently, with perhaps the primary culprit being psychiatric distress. Recommendations included medication and psychotherapy to treat ongoing mood symptoms and strategies for improving processing speed and cognitive flexibility.   Melanie Meyers was accompanied by her son-in-law and granddaughter. Content of the current session focused on the results of her evaluation, as well as treatment avenues for mood concerns. Melanie Meyers was given the opportunity to ask questions and all her questions were answered. She was also encouraged to reach out should additional questions arise. A copy of her report was provided at the conclusion of the visit.      A total of 15 minutes were spent with Melanie Meyers during the  current feedback session.

## 2018-09-28 ENCOUNTER — Other Ambulatory Visit: Payer: Medicare HMO

## 2018-09-28 DIAGNOSIS — M2021 Hallux rigidus, right foot: Secondary | ICD-10-CM | POA: Diagnosis not present

## 2018-09-28 DIAGNOSIS — E1141 Type 2 diabetes mellitus with diabetic mononeuropathy: Secondary | ICD-10-CM | POA: Diagnosis not present

## 2018-09-28 DIAGNOSIS — M2041 Other hammer toe(s) (acquired), right foot: Secondary | ICD-10-CM | POA: Diagnosis not present

## 2018-09-29 ENCOUNTER — Other Ambulatory Visit: Payer: Self-pay | Admitting: Cardiology

## 2018-10-03 ENCOUNTER — Ambulatory Visit: Payer: Medicare HMO | Admitting: Podiatry

## 2018-10-05 DIAGNOSIS — H524 Presbyopia: Secondary | ICD-10-CM | POA: Diagnosis not present

## 2018-10-05 DIAGNOSIS — H5203 Hypermetropia, bilateral: Secondary | ICD-10-CM | POA: Diagnosis not present

## 2018-10-05 DIAGNOSIS — I10 Essential (primary) hypertension: Secondary | ICD-10-CM | POA: Diagnosis not present

## 2018-10-05 DIAGNOSIS — E119 Type 2 diabetes mellitus without complications: Secondary | ICD-10-CM | POA: Diagnosis not present

## 2018-10-05 DIAGNOSIS — H25813 Combined forms of age-related cataract, bilateral: Secondary | ICD-10-CM | POA: Diagnosis not present

## 2018-10-05 DIAGNOSIS — H52223 Regular astigmatism, bilateral: Secondary | ICD-10-CM | POA: Diagnosis not present

## 2018-10-05 DIAGNOSIS — Z0100 Encounter for examination of eyes and vision without abnormal findings: Secondary | ICD-10-CM | POA: Diagnosis not present

## 2018-10-06 ENCOUNTER — Ambulatory Visit: Payer: Medicare HMO | Admitting: Neurology

## 2018-10-06 DIAGNOSIS — M4316 Spondylolisthesis, lumbar region: Secondary | ICD-10-CM | POA: Diagnosis not present

## 2018-10-06 DIAGNOSIS — M9903 Segmental and somatic dysfunction of lumbar region: Secondary | ICD-10-CM | POA: Diagnosis not present

## 2018-10-06 DIAGNOSIS — M9901 Segmental and somatic dysfunction of cervical region: Secondary | ICD-10-CM | POA: Diagnosis not present

## 2018-10-06 DIAGNOSIS — M4722 Other spondylosis with radiculopathy, cervical region: Secondary | ICD-10-CM | POA: Diagnosis not present

## 2018-10-06 DIAGNOSIS — M4304 Spondylolysis, thoracic region: Secondary | ICD-10-CM | POA: Diagnosis not present

## 2018-10-06 DIAGNOSIS — M9902 Segmental and somatic dysfunction of thoracic region: Secondary | ICD-10-CM | POA: Diagnosis not present

## 2018-10-07 ENCOUNTER — Ambulatory Visit: Payer: Medicare HMO | Admitting: Neurology

## 2018-10-10 DIAGNOSIS — M25561 Pain in right knee: Secondary | ICD-10-CM | POA: Diagnosis not present

## 2018-10-10 DIAGNOSIS — M79671 Pain in right foot: Secondary | ICD-10-CM | POA: Diagnosis not present

## 2018-10-10 DIAGNOSIS — M19071 Primary osteoarthritis, right ankle and foot: Secondary | ICD-10-CM | POA: Diagnosis not present

## 2018-10-10 DIAGNOSIS — M7731 Calcaneal spur, right foot: Secondary | ICD-10-CM | POA: Diagnosis not present

## 2018-10-11 DIAGNOSIS — M4722 Other spondylosis with radiculopathy, cervical region: Secondary | ICD-10-CM | POA: Diagnosis not present

## 2018-10-11 DIAGNOSIS — M9903 Segmental and somatic dysfunction of lumbar region: Secondary | ICD-10-CM | POA: Diagnosis not present

## 2018-10-11 DIAGNOSIS — M4316 Spondylolisthesis, lumbar region: Secondary | ICD-10-CM | POA: Diagnosis not present

## 2018-10-11 DIAGNOSIS — M4304 Spondylolysis, thoracic region: Secondary | ICD-10-CM | POA: Diagnosis not present

## 2018-10-11 DIAGNOSIS — M9901 Segmental and somatic dysfunction of cervical region: Secondary | ICD-10-CM | POA: Diagnosis not present

## 2018-10-11 DIAGNOSIS — M542 Cervicalgia: Secondary | ICD-10-CM | POA: Diagnosis not present

## 2018-10-11 DIAGNOSIS — M9902 Segmental and somatic dysfunction of thoracic region: Secondary | ICD-10-CM | POA: Diagnosis not present

## 2018-10-13 DIAGNOSIS — S83281D Other tear of lateral meniscus, current injury, right knee, subsequent encounter: Secondary | ICD-10-CM | POA: Diagnosis not present

## 2018-10-13 DIAGNOSIS — M1711 Unilateral primary osteoarthritis, right knee: Secondary | ICD-10-CM | POA: Diagnosis not present

## 2018-10-13 DIAGNOSIS — M25561 Pain in right knee: Secondary | ICD-10-CM | POA: Diagnosis not present

## 2018-10-13 DIAGNOSIS — S83261A Peripheral tear of lateral meniscus, current injury, right knee, initial encounter: Secondary | ICD-10-CM | POA: Insufficient documentation

## 2018-10-13 DIAGNOSIS — M7741 Metatarsalgia, right foot: Secondary | ICD-10-CM | POA: Diagnosis not present

## 2018-10-14 DIAGNOSIS — Z1231 Encounter for screening mammogram for malignant neoplasm of breast: Secondary | ICD-10-CM | POA: Diagnosis not present

## 2018-10-17 ENCOUNTER — Other Ambulatory Visit: Payer: Self-pay | Admitting: Neurology

## 2018-10-17 ENCOUNTER — Other Ambulatory Visit: Payer: Self-pay

## 2018-10-17 ENCOUNTER — Telehealth: Payer: Self-pay

## 2018-10-17 MED ORDER — PREGABALIN 150 MG PO CAPS: ORAL_CAPSULE | ORAL | 3 refills | Status: DC

## 2018-10-17 NOTE — Telephone Encounter (Signed)
Please schedule a follow up for pt. For HA, Neuropathy. Thank you.

## 2018-10-17 NOTE — Telephone Encounter (Signed)
Pt is requesting refills of Lyrica to her [place]. Last visit was in Feb with you and then followed up with Melvyn Novas. She needed a follow up in Aug 2020. Are you ok with sending in refills and then having the front office schedule an appt?

## 2018-10-17 NOTE — Telephone Encounter (Signed)
That is fine, thanks 

## 2018-10-18 DIAGNOSIS — E782 Mixed hyperlipidemia: Secondary | ICD-10-CM | POA: Diagnosis not present

## 2018-10-18 LAB — LIPID PANEL
Chol/HDL Ratio: 3.3 ratio (ref 0.0–4.4)
Cholesterol, Total: 181 mg/dL (ref 100–199)
HDL: 55 mg/dL
LDL Chol Calc (NIH): 85 mg/dL (ref 0–99)
Triglycerides: 250 mg/dL — ABNORMAL HIGH (ref 0–149)
VLDL Cholesterol Cal: 41 mg/dL — ABNORMAL HIGH (ref 5–40)

## 2018-10-18 NOTE — Telephone Encounter (Signed)
I left a message for her to call back to schedule a follow up visit. Will try her again

## 2018-10-19 ENCOUNTER — Telehealth: Payer: Self-pay | Admitting: Neurology

## 2018-10-19 DIAGNOSIS — M4304 Spondylolysis, thoracic region: Secondary | ICD-10-CM | POA: Diagnosis not present

## 2018-10-19 DIAGNOSIS — M9902 Segmental and somatic dysfunction of thoracic region: Secondary | ICD-10-CM | POA: Diagnosis not present

## 2018-10-19 DIAGNOSIS — M4722 Other spondylosis with radiculopathy, cervical region: Secondary | ICD-10-CM | POA: Diagnosis not present

## 2018-10-19 DIAGNOSIS — M9903 Segmental and somatic dysfunction of lumbar region: Secondary | ICD-10-CM | POA: Diagnosis not present

## 2018-10-19 DIAGNOSIS — M4316 Spondylolisthesis, lumbar region: Secondary | ICD-10-CM | POA: Diagnosis not present

## 2018-10-19 DIAGNOSIS — M9901 Segmental and somatic dysfunction of cervical region: Secondary | ICD-10-CM | POA: Diagnosis not present

## 2018-10-19 NOTE — Telephone Encounter (Signed)
Left message for patient to call back so we could get her schedule for a follow up

## 2018-10-20 ENCOUNTER — Telehealth: Payer: Self-pay | Admitting: Neurology

## 2018-10-20 NOTE — Telephone Encounter (Signed)
Noted, pls mail letter and let her know to schedule appointment otherwise we cannot continue prescribing medication. Thanks

## 2018-10-20 NOTE — Telephone Encounter (Signed)
I called and left message for the third time today to sch a follow up appt with Dr Delice Lesch  This is in regards to the telephone message dated 10-17-18

## 2018-10-21 ENCOUNTER — Encounter: Payer: Self-pay | Admitting: Neurology

## 2018-10-21 NOTE — Telephone Encounter (Signed)
Mailed letter out today

## 2018-10-27 DIAGNOSIS — M9902 Segmental and somatic dysfunction of thoracic region: Secondary | ICD-10-CM | POA: Diagnosis not present

## 2018-10-27 DIAGNOSIS — M4304 Spondylolysis, thoracic region: Secondary | ICD-10-CM | POA: Diagnosis not present

## 2018-10-27 DIAGNOSIS — M9901 Segmental and somatic dysfunction of cervical region: Secondary | ICD-10-CM | POA: Diagnosis not present

## 2018-10-27 DIAGNOSIS — M4722 Other spondylosis with radiculopathy, cervical region: Secondary | ICD-10-CM | POA: Diagnosis not present

## 2018-10-27 DIAGNOSIS — M9903 Segmental and somatic dysfunction of lumbar region: Secondary | ICD-10-CM | POA: Diagnosis not present

## 2018-10-27 DIAGNOSIS — M4316 Spondylolisthesis, lumbar region: Secondary | ICD-10-CM | POA: Diagnosis not present

## 2018-10-28 DIAGNOSIS — G4733 Obstructive sleep apnea (adult) (pediatric): Secondary | ICD-10-CM | POA: Diagnosis not present

## 2018-11-01 DIAGNOSIS — M9903 Segmental and somatic dysfunction of lumbar region: Secondary | ICD-10-CM | POA: Diagnosis not present

## 2018-11-01 DIAGNOSIS — M4316 Spondylolisthesis, lumbar region: Secondary | ICD-10-CM | POA: Diagnosis not present

## 2018-11-01 DIAGNOSIS — M4304 Spondylolysis, thoracic region: Secondary | ICD-10-CM | POA: Diagnosis not present

## 2018-11-01 DIAGNOSIS — M9901 Segmental and somatic dysfunction of cervical region: Secondary | ICD-10-CM | POA: Diagnosis not present

## 2018-11-01 DIAGNOSIS — M4722 Other spondylosis with radiculopathy, cervical region: Secondary | ICD-10-CM | POA: Diagnosis not present

## 2018-11-01 DIAGNOSIS — M9902 Segmental and somatic dysfunction of thoracic region: Secondary | ICD-10-CM | POA: Diagnosis not present

## 2018-11-03 ENCOUNTER — Ambulatory Visit: Payer: Medicare HMO | Admitting: Sports Medicine

## 2018-11-08 DIAGNOSIS — M4316 Spondylolisthesis, lumbar region: Secondary | ICD-10-CM | POA: Diagnosis not present

## 2018-11-08 DIAGNOSIS — M4304 Spondylolysis, thoracic region: Secondary | ICD-10-CM | POA: Diagnosis not present

## 2018-11-08 DIAGNOSIS — M4722 Other spondylosis with radiculopathy, cervical region: Secondary | ICD-10-CM | POA: Diagnosis not present

## 2018-11-08 DIAGNOSIS — M9902 Segmental and somatic dysfunction of thoracic region: Secondary | ICD-10-CM | POA: Diagnosis not present

## 2018-11-08 DIAGNOSIS — M9903 Segmental and somatic dysfunction of lumbar region: Secondary | ICD-10-CM | POA: Diagnosis not present

## 2018-11-08 DIAGNOSIS — M9901 Segmental and somatic dysfunction of cervical region: Secondary | ICD-10-CM | POA: Diagnosis not present

## 2018-11-09 DIAGNOSIS — M25561 Pain in right knee: Secondary | ICD-10-CM | POA: Diagnosis not present

## 2018-11-09 DIAGNOSIS — Z6841 Body Mass Index (BMI) 40.0 and over, adult: Secondary | ICD-10-CM | POA: Diagnosis not present

## 2018-11-09 DIAGNOSIS — D860 Sarcoidosis of lung: Secondary | ICD-10-CM | POA: Diagnosis not present

## 2018-11-09 DIAGNOSIS — E1149 Type 2 diabetes mellitus with other diabetic neurological complication: Secondary | ICD-10-CM | POA: Diagnosis not present

## 2018-11-09 DIAGNOSIS — R300 Dysuria: Secondary | ICD-10-CM | POA: Diagnosis not present

## 2018-11-09 DIAGNOSIS — E1165 Type 2 diabetes mellitus with hyperglycemia: Secondary | ICD-10-CM | POA: Diagnosis not present

## 2018-11-11 DIAGNOSIS — M4304 Spondylolysis, thoracic region: Secondary | ICD-10-CM | POA: Diagnosis not present

## 2018-11-11 DIAGNOSIS — M9902 Segmental and somatic dysfunction of thoracic region: Secondary | ICD-10-CM | POA: Diagnosis not present

## 2018-11-11 DIAGNOSIS — M4722 Other spondylosis with radiculopathy, cervical region: Secondary | ICD-10-CM | POA: Diagnosis not present

## 2018-11-11 DIAGNOSIS — M9901 Segmental and somatic dysfunction of cervical region: Secondary | ICD-10-CM | POA: Diagnosis not present

## 2018-11-11 DIAGNOSIS — M9903 Segmental and somatic dysfunction of lumbar region: Secondary | ICD-10-CM | POA: Diagnosis not present

## 2018-11-11 DIAGNOSIS — M4316 Spondylolisthesis, lumbar region: Secondary | ICD-10-CM | POA: Diagnosis not present

## 2018-11-14 DIAGNOSIS — M9903 Segmental and somatic dysfunction of lumbar region: Secondary | ICD-10-CM | POA: Diagnosis not present

## 2018-11-14 DIAGNOSIS — M4304 Spondylolysis, thoracic region: Secondary | ICD-10-CM | POA: Diagnosis not present

## 2018-11-14 DIAGNOSIS — M9901 Segmental and somatic dysfunction of cervical region: Secondary | ICD-10-CM | POA: Diagnosis not present

## 2018-11-14 DIAGNOSIS — M5412 Radiculopathy, cervical region: Secondary | ICD-10-CM | POA: Diagnosis not present

## 2018-11-14 DIAGNOSIS — M5432 Sciatica, left side: Secondary | ICD-10-CM | POA: Diagnosis not present

## 2018-11-14 DIAGNOSIS — M9902 Segmental and somatic dysfunction of thoracic region: Secondary | ICD-10-CM | POA: Diagnosis not present

## 2018-11-17 ENCOUNTER — Ambulatory Visit: Payer: Medicare HMO | Admitting: Sports Medicine

## 2018-11-22 DIAGNOSIS — M9901 Segmental and somatic dysfunction of cervical region: Secondary | ICD-10-CM | POA: Diagnosis not present

## 2018-11-22 DIAGNOSIS — M5412 Radiculopathy, cervical region: Secondary | ICD-10-CM | POA: Diagnosis not present

## 2018-11-22 DIAGNOSIS — M9903 Segmental and somatic dysfunction of lumbar region: Secondary | ICD-10-CM | POA: Diagnosis not present

## 2018-11-22 DIAGNOSIS — M5432 Sciatica, left side: Secondary | ICD-10-CM | POA: Diagnosis not present

## 2018-11-22 DIAGNOSIS — Z9181 History of falling: Secondary | ICD-10-CM | POA: Diagnosis not present

## 2018-11-22 DIAGNOSIS — Z1331 Encounter for screening for depression: Secondary | ICD-10-CM | POA: Diagnosis not present

## 2018-11-22 DIAGNOSIS — M9902 Segmental and somatic dysfunction of thoracic region: Secondary | ICD-10-CM | POA: Diagnosis not present

## 2018-11-22 DIAGNOSIS — Z6841 Body Mass Index (BMI) 40.0 and over, adult: Secondary | ICD-10-CM | POA: Diagnosis not present

## 2018-11-22 DIAGNOSIS — M4304 Spondylolysis, thoracic region: Secondary | ICD-10-CM | POA: Diagnosis not present

## 2018-11-22 DIAGNOSIS — Z0000 Encounter for general adult medical examination without abnormal findings: Secondary | ICD-10-CM | POA: Diagnosis not present

## 2018-11-22 DIAGNOSIS — E785 Hyperlipidemia, unspecified: Secondary | ICD-10-CM | POA: Diagnosis not present

## 2018-11-24 ENCOUNTER — Ambulatory Visit (INDEPENDENT_AMBULATORY_CARE_PROVIDER_SITE_OTHER): Payer: Medicare HMO | Admitting: Cardiology

## 2018-11-24 ENCOUNTER — Other Ambulatory Visit: Payer: Self-pay

## 2018-11-24 ENCOUNTER — Encounter: Payer: Self-pay | Admitting: Cardiology

## 2018-11-24 VITALS — BP 102/60 | HR 61 | Ht 60.0 in | Wt 228.0 lb

## 2018-11-24 DIAGNOSIS — E088 Diabetes mellitus due to underlying condition with unspecified complications: Secondary | ICD-10-CM | POA: Diagnosis not present

## 2018-11-24 DIAGNOSIS — E782 Mixed hyperlipidemia: Secondary | ICD-10-CM | POA: Diagnosis not present

## 2018-11-24 DIAGNOSIS — I2510 Atherosclerotic heart disease of native coronary artery without angina pectoris: Secondary | ICD-10-CM

## 2018-11-24 DIAGNOSIS — I10 Essential (primary) hypertension: Secondary | ICD-10-CM | POA: Diagnosis not present

## 2018-11-24 NOTE — Patient Instructions (Signed)
Medication Instructions:  Your physician recommends that you continue on your current medications as directed. Please refer to the Current Medication list given to you today.  *If you need a refill on your cardiac medications before your next appointment, please call your pharmacy*  Lab Work: None  If you have labs (blood work) drawn today and your tests are completely normal, you will receive your results only by: Marland Kitchen MyChart Message (if you have MyChart) OR . A paper copy in the mail If you have any lab test that is abnormal or we need to change your treatment, we will call you to review the results.  Testing/Procedures: None  Follow-Up: At [facility], you and your health needs are our priority.  As part of our continuing mission to provide you with exceptional heart care, we have created designated Provider Care Teams.  These Care Teams include your primary Cardiologist (physician) and Advanced Practice Providers (APPs -  Physician Assistants and Nurse Practitioners) who all work together to provide you with the care you need, when you need it.  Your next appointment:   6 month(s)  The format for your next appointment:   Virtual Visit   Provider:   Shirlee More, MD
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
## 2018-11-26 ENCOUNTER — Other Ambulatory Visit: Payer: Self-pay | Admitting: Cardiology

## 2018-11-28 DIAGNOSIS — M5432 Sciatica, left side: Secondary | ICD-10-CM | POA: Diagnosis not present

## 2018-11-28 DIAGNOSIS — M9903 Segmental and somatic dysfunction of lumbar region: Secondary | ICD-10-CM | POA: Diagnosis not present

## 2018-11-28 DIAGNOSIS — M9902 Segmental and somatic dysfunction of thoracic region: Secondary | ICD-10-CM | POA: Diagnosis not present

## 2018-11-28 DIAGNOSIS — M5412 Radiculopathy, cervical region: Secondary | ICD-10-CM | POA: Diagnosis not present

## 2018-11-28 DIAGNOSIS — M4304 Spondylolysis, thoracic region: Secondary | ICD-10-CM | POA: Diagnosis not present

## 2018-11-28 DIAGNOSIS — M9901 Segmental and somatic dysfunction of cervical region: Secondary | ICD-10-CM | POA: Diagnosis not present

## 2018-11-28 NOTE — Telephone Encounter (Signed)
Olmesartan refill sent to [facility] in [facility]

## 2018-11-29 ENCOUNTER — Other Ambulatory Visit: Payer: Self-pay | Admitting: Cardiology

## 2018-11-29 ENCOUNTER — Telehealth: Payer: Self-pay | Admitting: Cardiology

## 2018-11-29 NOTE — Telephone Encounter (Signed)
Refill sent by Harrel Lemon, CMA.

## 2018-11-29 NOTE — Telephone Encounter (Signed)
Call [facility]rol[facility] to [facility]

## 2018-12-05 DIAGNOSIS — M9903 Segmental and somatic dysfunction of lumbar region: Secondary | ICD-10-CM | POA: Diagnosis not present

## 2018-12-05 DIAGNOSIS — M9902 Segmental and somatic dysfunction of thoracic region: Secondary | ICD-10-CM | POA: Diagnosis not present

## 2018-12-05 DIAGNOSIS — M4304 Spondylolysis, thoracic region: Secondary | ICD-10-CM | POA: Diagnosis not present

## 2018-12-05 DIAGNOSIS — M9901 Segmental and somatic dysfunction of cervical region: Secondary | ICD-10-CM | POA: Diagnosis not present

## 2018-12-05 DIAGNOSIS — M5412 Radiculopathy, cervical region: Secondary | ICD-10-CM | POA: Diagnosis not present

## 2018-12-05 DIAGNOSIS — M5432 Sciatica, left side: Secondary | ICD-10-CM | POA: Diagnosis not present

## 2018-12-07 DIAGNOSIS — E559 Vitamin D deficiency, unspecified: Secondary | ICD-10-CM | POA: Diagnosis not present

## 2018-12-07 DIAGNOSIS — M25521 Pain in right elbow: Secondary | ICD-10-CM | POA: Diagnosis not present

## 2018-12-07 DIAGNOSIS — S59901A Unspecified injury of right elbow, initial encounter: Secondary | ICD-10-CM | POA: Diagnosis not present

## 2018-12-07 DIAGNOSIS — M549 Dorsalgia, unspecified: Secondary | ICD-10-CM | POA: Diagnosis not present

## 2018-12-07 DIAGNOSIS — Z794 Long term (current) use of insulin: Secondary | ICD-10-CM | POA: Diagnosis not present

## 2018-12-07 DIAGNOSIS — I10 Essential (primary) hypertension: Secondary | ICD-10-CM | POA: Diagnosis not present

## 2018-12-07 DIAGNOSIS — Z79899 Other long term (current) drug therapy: Secondary | ICD-10-CM | POA: Diagnosis not present

## 2018-12-07 DIAGNOSIS — I739 Peripheral vascular disease, unspecified: Secondary | ICD-10-CM | POA: Diagnosis not present

## 2018-12-07 DIAGNOSIS — E1165 Type 2 diabetes mellitus with hyperglycemia: Secondary | ICD-10-CM | POA: Diagnosis not present

## 2018-12-07 DIAGNOSIS — E1140 Type 2 diabetes mellitus with diabetic neuropathy, unspecified: Secondary | ICD-10-CM | POA: Diagnosis not present

## 2018-12-07 DIAGNOSIS — Z6841 Body Mass Index (BMI) 40.0 and over, adult: Secondary | ICD-10-CM | POA: Diagnosis not present

## 2018-12-07 DIAGNOSIS — E1151 Type 2 diabetes mellitus with diabetic peripheral angiopathy without gangrene: Secondary | ICD-10-CM | POA: Diagnosis not present

## 2018-12-07 DIAGNOSIS — E039 Hypothyroidism, unspecified: Secondary | ICD-10-CM | POA: Diagnosis not present

## 2018-12-07 DIAGNOSIS — E1149 Type 2 diabetes mellitus with other diabetic neurological complication: Secondary | ICD-10-CM | POA: Diagnosis not present

## 2018-12-07 DIAGNOSIS — E785 Hyperlipidemia, unspecified: Secondary | ICD-10-CM | POA: Diagnosis not present

## 2018-12-07 DIAGNOSIS — F5104 Psychophysiologic insomnia: Secondary | ICD-10-CM | POA: Diagnosis not present

## 2018-12-08 DIAGNOSIS — M5432 Sciatica, left side: Secondary | ICD-10-CM | POA: Diagnosis not present

## 2018-12-08 DIAGNOSIS — M9903 Segmental and somatic dysfunction of lumbar region: Secondary | ICD-10-CM | POA: Diagnosis not present

## 2018-12-08 DIAGNOSIS — M9901 Segmental and somatic dysfunction of cervical region: Secondary | ICD-10-CM | POA: Diagnosis not present

## 2018-12-08 DIAGNOSIS — M5412 Radiculopathy, cervical region: Secondary | ICD-10-CM | POA: Diagnosis not present

## 2018-12-08 DIAGNOSIS — M9902 Segmental and somatic dysfunction of thoracic region: Secondary | ICD-10-CM | POA: Diagnosis not present

## 2018-12-08 DIAGNOSIS — M4304 Spondylolysis, thoracic region: Secondary | ICD-10-CM | POA: Diagnosis not present

## 2018-12-12 DIAGNOSIS — M5412 Radiculopathy, cervical region: Secondary | ICD-10-CM | POA: Diagnosis not present

## 2018-12-12 DIAGNOSIS — M9902 Segmental and somatic dysfunction of thoracic region: Secondary | ICD-10-CM | POA: Diagnosis not present

## 2018-12-12 DIAGNOSIS — M5432 Sciatica, left side: Secondary | ICD-10-CM | POA: Diagnosis not present

## 2018-12-12 DIAGNOSIS — M4304 Spondylolysis, thoracic region: Secondary | ICD-10-CM | POA: Diagnosis not present

## 2018-12-12 DIAGNOSIS — M9903 Segmental and somatic dysfunction of lumbar region: Secondary | ICD-10-CM | POA: Diagnosis not present

## 2018-12-12 DIAGNOSIS — M9901 Segmental and somatic dysfunction of cervical region: Secondary | ICD-10-CM | POA: Diagnosis not present

## 2018-12-15 DIAGNOSIS — M5432 Sciatica, left side: Secondary | ICD-10-CM | POA: Diagnosis not present

## 2018-12-15 DIAGNOSIS — M4304 Spondylolysis, thoracic region: Secondary | ICD-10-CM | POA: Diagnosis not present

## 2018-12-15 DIAGNOSIS — M9902 Segmental and somatic dysfunction of thoracic region: Secondary | ICD-10-CM | POA: Diagnosis not present

## 2018-12-15 DIAGNOSIS — M5412 Radiculopathy, cervical region: Secondary | ICD-10-CM | POA: Diagnosis not present

## 2018-12-15 DIAGNOSIS — M9901 Segmental and somatic dysfunction of cervical region: Secondary | ICD-10-CM | POA: Diagnosis not present

## 2018-12-15 DIAGNOSIS — M9903 Segmental and somatic dysfunction of lumbar region: Secondary | ICD-10-CM | POA: Diagnosis not present

## 2018-12-16 ENCOUNTER — Other Ambulatory Visit: Payer: Self-pay

## 2018-12-16 ENCOUNTER — Encounter: Payer: Self-pay | Admitting: Sports Medicine

## 2018-12-16 ENCOUNTER — Ambulatory Visit (INDEPENDENT_AMBULATORY_CARE_PROVIDER_SITE_OTHER): Payer: Medicare HMO | Admitting: Sports Medicine

## 2018-12-16 DIAGNOSIS — B351 Tinea unguium: Secondary | ICD-10-CM

## 2018-12-16 DIAGNOSIS — S93601S Unspecified sprain of right foot, sequela: Secondary | ICD-10-CM

## 2018-12-16 DIAGNOSIS — E1169 Type 2 diabetes mellitus with other specified complication: Secondary | ICD-10-CM

## 2018-12-16 DIAGNOSIS — M79609 Pain in unspecified limb: Secondary | ICD-10-CM

## 2018-12-16 DIAGNOSIS — M79671 Pain in right foot: Secondary | ICD-10-CM

## 2018-12-16 DIAGNOSIS — E669 Obesity, unspecified: Secondary | ICD-10-CM
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
## 2018-12-19 DIAGNOSIS — M9901 Segmental and somatic dysfunction of cervical region: Secondary | ICD-10-CM | POA: Diagnosis not present

## 2018-12-19 DIAGNOSIS — M9902 Segmental and somatic dysfunction of thoracic region: Secondary | ICD-10-CM | POA: Diagnosis not present

## 2018-12-19 DIAGNOSIS — M9903 Segmental and somatic dysfunction of lumbar region: Secondary | ICD-10-CM | POA: Diagnosis not present

## 2018-12-19 DIAGNOSIS — M4304 Spondylolysis, thoracic region: Secondary | ICD-10-CM | POA: Diagnosis not present

## 2018-12-19 DIAGNOSIS — M5412 Radiculopathy, cervical region: Secondary | ICD-10-CM | POA: Diagnosis not present

## 2018-12-19 DIAGNOSIS — M5432 Sciatica, left side: Secondary | ICD-10-CM | POA: Diagnosis not present

## 2018-12-22 DIAGNOSIS — M9901 Segmental and somatic dysfunction of cervical region: Secondary | ICD-10-CM | POA: Diagnosis not present

## 2018-12-22 DIAGNOSIS — M4304 Spondylolysis, thoracic region: Secondary | ICD-10-CM | POA: Diagnosis not present

## 2018-12-22 DIAGNOSIS — M9903 Segmental and somatic dysfunction of lumbar region: Secondary | ICD-10-CM | POA: Diagnosis not present

## 2018-12-22 DIAGNOSIS — M5432 Sciatica, left side: Secondary | ICD-10-CM | POA: Diagnosis not present

## 2018-12-22 DIAGNOSIS — M9902 Segmental and somatic dysfunction of thoracic region: Secondary | ICD-10-CM | POA: Diagnosis not present

## 2018-12-22 DIAGNOSIS — M5412 Radiculopathy, cervical region: Secondary | ICD-10-CM | POA: Diagnosis not present

## 2018-12-26 DIAGNOSIS — M5432 Sciatica, left side: Secondary | ICD-10-CM | POA: Diagnosis not present

## 2018-12-26 DIAGNOSIS — M5412 Radiculopathy, cervical region: Secondary | ICD-10-CM | POA: Diagnosis not present

## 2018-12-26 DIAGNOSIS — M9903 Segmental and somatic dysfunction of lumbar region: Secondary | ICD-10-CM | POA: Diagnosis not present

## 2018-12-26 DIAGNOSIS — M9902 Segmental and somatic dysfunction of thoracic region: Secondary | ICD-10-CM | POA: Diagnosis not present

## 2018-12-26 DIAGNOSIS — M9901 Segmental and somatic dysfunction of cervical region: Secondary | ICD-10-CM | POA: Diagnosis not present

## 2018-12-26 DIAGNOSIS — M4304 Spondylolysis, thoracic region: Secondary | ICD-10-CM | POA: Diagnosis not present

## 2019-01-04 DIAGNOSIS — M9902 Segmental and somatic dysfunction of thoracic region: Secondary | ICD-10-CM | POA: Diagnosis not present

## 2019-01-04 DIAGNOSIS — M9901 Segmental and somatic dysfunction of cervical region: Secondary | ICD-10-CM | POA: Diagnosis not present

## 2019-01-04 DIAGNOSIS — M5412 Radiculopathy, cervical region: Secondary | ICD-10-CM | POA: Diagnosis not present

## 2019-01-04 DIAGNOSIS — Z6841 Body Mass Index (BMI) 40.0 and over, adult: Secondary | ICD-10-CM | POA: Diagnosis not present

## 2019-01-04 DIAGNOSIS — M9903 Segmental and somatic dysfunction of lumbar region: Secondary | ICD-10-CM | POA: Diagnosis not present

## 2019-01-04 DIAGNOSIS — R110 Nausea: Secondary | ICD-10-CM | POA: Diagnosis not present

## 2019-01-04 DIAGNOSIS — M4304 Spondylolysis, thoracic region: Secondary | ICD-10-CM | POA: Diagnosis not present

## 2019-01-04 DIAGNOSIS — M5432 Sciatica, left side: Secondary | ICD-10-CM | POA: Diagnosis not present

## 2019-01-04 DIAGNOSIS — R109 Unspecified abdominal pain: Secondary | ICD-10-CM | POA: Diagnosis not present

## 2019-01-04 DIAGNOSIS — K581 Irritable bowel syndrome with constipation: Secondary | ICD-10-CM | POA: Diagnosis not present

## 2019-01-04 DIAGNOSIS — K5900 Constipation, unspecified: Secondary | ICD-10-CM | POA: Diagnosis not present

## 2019-01-10 ENCOUNTER — Telehealth: Payer: Self-pay

## 2019-01-10 DIAGNOSIS — M5432 Sciatica, left side: Secondary | ICD-10-CM | POA: Diagnosis not present

## 2019-01-10 DIAGNOSIS — S93601A Unspecified sprain of right foot, initial encounter: Secondary | ICD-10-CM

## 2019-01-10 DIAGNOSIS — R600 Localized edema: Secondary | ICD-10-CM

## 2019-01-10 DIAGNOSIS — M9901 Segmental and somatic dysfunction of cervical region: Secondary | ICD-10-CM | POA: Diagnosis not present

## 2019-01-10 DIAGNOSIS — M9902 Segmental and somatic dysfunction of thoracic region: Secondary | ICD-10-CM | POA: Diagnosis not present

## 2019-01-10 DIAGNOSIS — M79671 Pain in right foot: Secondary | ICD-10-CM

## 2019-01-10 DIAGNOSIS — S9031XA Contusion of right foot, initial encounter: Secondary | ICD-10-CM

## 2019-01-10 DIAGNOSIS — M5412 Radiculopathy, cervical region: Secondary | ICD-10-CM | POA: Diagnosis not present

## 2019-01-10 DIAGNOSIS — M4304 Spondylolysis, thoracic region: Secondary | ICD-10-CM | POA: Diagnosis not present

## 2019-01-10 DIAGNOSIS — M9903 Segmental and somatic dysfunction of lumbar region: Secondary | ICD-10-CM | POA: Diagnosis not present

## 2019-01-10 NOTE — Addendum Note (Signed)
Addended by: Harriett Sine D on: 01/10/2019 02:22 PM   Modules accepted: Orders

## 2019-01-10 NOTE — Telephone Encounter (Signed)
Pt called requesting to know status of MRI Apt. Please advice
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
## 2019-01-11 NOTE — Telephone Encounter (Signed)
[place], " [place] MEDICAID MEMBER DOES NOT REQUIRE PRIOR AUTHORIZATION FOR OUTPATIENT RADIOLOGY THROUGH [facility] OR [facility] AT THIS TIME."

## 2019-01-11 NOTE — Telephone Encounter (Signed)
Yes, I'll send it to you after I do the Pre-cert. Thanks

## 2019-01-12 ENCOUNTER — Telehealth: Payer: Self-pay

## 2019-01-12 DIAGNOSIS — M5432 Sciatica, left side: Secondary | ICD-10-CM | POA: Diagnosis not present

## 2019-01-12 DIAGNOSIS — M4304 Spondylolysis, thoracic region: Secondary | ICD-10-CM | POA: Diagnosis not present

## 2019-01-12 DIAGNOSIS — M9902 Segmental and somatic dysfunction of thoracic region: Secondary | ICD-10-CM | POA: Diagnosis not present

## 2019-01-12 DIAGNOSIS — M9903 Segmental and somatic dysfunction of lumbar region: Secondary | ICD-10-CM | POA: Diagnosis not present

## 2019-01-12 DIAGNOSIS — M5412 Radiculopathy, cervical region: Secondary | ICD-10-CM | POA: Diagnosis not present

## 2019-01-12 DIAGNOSIS — M9901 Segmental and somatic dysfunction of cervical region: Secondary | ICD-10-CM | POA: Diagnosis not present

## 2019-01-12 NOTE — Telephone Encounter (Signed)
Fax sent to [facility] for Pt's precert approval

## 2019-01-12 NOTE — Telephone Encounter (Signed)
[employer] states MRI of Maline [facility] is approved,  Auth: [id number] Exp: 01/12/19 - 02-11-19

## 2019-01-13 NOTE — Telephone Encounter (Signed)
[place] scheduled MRI [id number] right foot on 01/17/2019 arrive 11:45am for 12:00pm imaging. Faxed to [facility]. I informed pt of [facility] 01/17/2019 appt.

## 2019-01-17 DIAGNOSIS — M79671 Pain in right foot: Secondary | ICD-10-CM | POA: Diagnosis not present

## 2019-01-17 DIAGNOSIS — S9031XA Contusion of right foot, initial encounter: Secondary | ICD-10-CM | POA: Diagnosis not present

## 2019-01-17 DIAGNOSIS — M19071 Primary osteoarthritis, right ankle and foot: Secondary | ICD-10-CM | POA: Diagnosis not present

## 2019-01-17 DIAGNOSIS — X58XXXA Exposure to other specified factors, initial encounter: Secondary | ICD-10-CM | POA: Diagnosis not present

## 2019-01-18 DIAGNOSIS — M4722 Other spondylosis with radiculopathy, cervical region: Secondary | ICD-10-CM | POA: Diagnosis not present

## 2019-01-18 DIAGNOSIS — M4724 Other spondylosis with radiculopathy, thoracic region: Secondary | ICD-10-CM | POA: Diagnosis not present

## 2019-01-18 DIAGNOSIS — M47817 Spondylosis without myelopathy or radiculopathy, lumbosacral region: Secondary | ICD-10-CM | POA: Diagnosis not present

## 2019-01-18 DIAGNOSIS — M9902 Segmental and somatic dysfunction of thoracic region: Secondary | ICD-10-CM | POA: Diagnosis not present

## 2019-01-18 DIAGNOSIS — M9903 Segmental and somatic dysfunction of lumbar region: Secondary | ICD-10-CM | POA: Diagnosis not present

## 2019-01-18 DIAGNOSIS — M9901 Segmental and somatic dysfunction of cervical region: Secondary | ICD-10-CM | POA: Diagnosis not present

## 2019-01-23 DIAGNOSIS — R918 Other nonspecific abnormal finding of lung field: Secondary | ICD-10-CM | POA: Diagnosis not present

## 2019-01-23 DIAGNOSIS — D869 Sarcoidosis, unspecified: Secondary | ICD-10-CM | POA: Diagnosis not present

## 2019-01-23 DIAGNOSIS — J309 Allergic rhinitis, unspecified: Secondary | ICD-10-CM | POA: Diagnosis not present

## 2019-01-23 DIAGNOSIS — J301 Allergic rhinitis due to pollen: Secondary | ICD-10-CM | POA: Diagnosis not present

## 2019-01-23 DIAGNOSIS — G4733 Obstructive sleep apnea (adult) (pediatric): Secondary | ICD-10-CM | POA: Diagnosis not present

## 2019-01-25 DIAGNOSIS — M47817 Spondylosis without myelopathy or radiculopathy, lumbosacral region: Secondary | ICD-10-CM | POA: Diagnosis not present

## 2019-01-25 DIAGNOSIS — M4724 Other spondylosis with radiculopathy, thoracic region: Secondary | ICD-10-CM | POA: Diagnosis not present

## 2019-01-25 DIAGNOSIS — M9901 Segmental and somatic dysfunction of cervical region: Secondary | ICD-10-CM | POA: Diagnosis not present

## 2019-01-25 DIAGNOSIS — M9902 Segmental and somatic dysfunction of thoracic region: Secondary | ICD-10-CM | POA: Diagnosis not present

## 2019-01-25 DIAGNOSIS — M9903 Segmental and somatic dysfunction of lumbar region: Secondary | ICD-10-CM | POA: Diagnosis not present

## 2019-01-25 DIAGNOSIS — M4722 Other spondylosis with radiculopathy, cervical region: Secondary | ICD-10-CM | POA: Diagnosis not present

## 2019-01-26 DIAGNOSIS — M1711 Unilateral primary osteoarthritis, right knee: Secondary | ICD-10-CM | POA: Diagnosis not present

## 2019-01-26 DIAGNOSIS — M25561 Pain in right knee: Secondary | ICD-10-CM | POA: Diagnosis not present

## 2019-01-26 DIAGNOSIS — Z6841 Body Mass Index (BMI) 40.0 and over, adult: Secondary | ICD-10-CM | POA: Diagnosis not present

## 2019-01-27 ENCOUNTER — Telehealth: Payer: Self-pay | Admitting: Sports Medicine

## 2019-01-27 NOTE — Telephone Encounter (Signed)
LVM to Pt stating to return our phone call to review MRI results and Dr's instructions

## 2019-01-27 NOTE — Telephone Encounter (Signed)
Spoke with pt about MRI pt stated understanding,  Also I went over with Dr's instructions, Pt stated understanding and states she would like to get Diclofenac sent to [facility] on [address]

## 2019-01-27 NOTE — Telephone Encounter (Signed)
Returned patient's call. Patient did not answer CMA will try to call her again later to go over results as below:  MRI reveals arthritis at the first toe and inflammation in between the first and second toes/metatarsals consistent with bursitis.  There is no fracture no break or any other acute findings on her MRI. Recommend patient to treat symptoms with anti-inflammatories patient can try Tylenol arthritis or if this does not work we can send diclofenac for her to take by mouth for the inflammation and arthritis in her foot.  -Dr. Cannon Kettle

## 2019-01-30 DIAGNOSIS — G4733 Obstructive sleep apnea (adult) (pediatric): Secondary | ICD-10-CM | POA: Diagnosis not present

## 2019-02-01 ENCOUNTER — Encounter: Payer: Self-pay | Admitting: Sports Medicine

## 2019-02-01 DIAGNOSIS — M9902 Segmental and somatic dysfunction of thoracic region: Secondary | ICD-10-CM | POA: Diagnosis not present

## 2019-02-01 DIAGNOSIS — M4722 Other spondylosis with radiculopathy, cervical region: Secondary | ICD-10-CM | POA: Diagnosis not present

## 2019-02-01 DIAGNOSIS — M47817 Spondylosis without myelopathy or radiculopathy, lumbosacral region: Secondary | ICD-10-CM | POA: Diagnosis not present

## 2019-02-01 DIAGNOSIS — M4724 Other spondylosis with radiculopathy, thoracic region: Secondary | ICD-10-CM | POA: Diagnosis not present

## 2019-02-01 DIAGNOSIS — M9901 Segmental and somatic dysfunction of cervical region: Secondary | ICD-10-CM | POA: Diagnosis not present

## 2019-02-01 DIAGNOSIS — M9903 Segmental and somatic dysfunction of lumbar region: Secondary | ICD-10-CM | POA: Diagnosis not present

## 2019-02-06 DIAGNOSIS — Z9013 Acquired absence of bilateral breasts and nipples: Secondary | ICD-10-CM | POA: Diagnosis not present

## 2019-02-08 DIAGNOSIS — M9903 Segmental and somatic dysfunction of lumbar region: Secondary | ICD-10-CM | POA: Diagnosis not present

## 2019-02-08 DIAGNOSIS — M47817 Spondylosis without myelopathy or radiculopathy, lumbosacral region: Secondary | ICD-10-CM | POA: Diagnosis not present

## 2019-02-08 DIAGNOSIS — M9901 Segmental and somatic dysfunction of cervical region: Secondary | ICD-10-CM | POA: Diagnosis not present

## 2019-02-08 DIAGNOSIS — M4722 Other spondylosis with radiculopathy, cervical region: Secondary | ICD-10-CM | POA: Diagnosis not present

## 2019-02-08 DIAGNOSIS — M4724 Other spondylosis with radiculopathy, thoracic region: Secondary | ICD-10-CM | POA: Diagnosis not present

## 2019-02-08 DIAGNOSIS — M9902 Segmental and somatic dysfunction of thoracic region: Secondary | ICD-10-CM | POA: Diagnosis not present

## 2019-02-17 DIAGNOSIS — M9903 Segmental and somatic dysfunction of lumbar region: Secondary | ICD-10-CM | POA: Diagnosis not present

## 2019-02-17 DIAGNOSIS — M4724 Other spondylosis with radiculopathy, thoracic region: Secondary | ICD-10-CM | POA: Diagnosis not present

## 2019-02-17 DIAGNOSIS — M9901 Segmental and somatic dysfunction of cervical region: Secondary | ICD-10-CM | POA: Diagnosis not present

## 2019-02-17 DIAGNOSIS — M9902 Segmental and somatic dysfunction of thoracic region: Secondary | ICD-10-CM | POA: Diagnosis not present

## 2019-02-17 DIAGNOSIS — M47817 Spondylosis without myelopathy or radiculopathy, lumbosacral region: Secondary | ICD-10-CM | POA: Diagnosis not present

## 2019-02-17 DIAGNOSIS — M4722 Other spondylosis with radiculopathy, cervical region: Secondary | ICD-10-CM | POA: Diagnosis not present

## 2019-03-01 DIAGNOSIS — M9901 Segmental and somatic dysfunction of cervical region: Secondary | ICD-10-CM | POA: Diagnosis not present

## 2019-03-01 DIAGNOSIS — M4724 Other spondylosis with radiculopathy, thoracic region: Secondary | ICD-10-CM | POA: Diagnosis not present

## 2019-03-01 DIAGNOSIS — M4722 Other spondylosis with radiculopathy, cervical region: Secondary | ICD-10-CM | POA: Diagnosis not present

## 2019-03-01 DIAGNOSIS — M47817 Spondylosis without myelopathy or radiculopathy, lumbosacral region: Secondary | ICD-10-CM | POA: Diagnosis not present

## 2019-03-01 DIAGNOSIS — M9903 Segmental and somatic dysfunction of lumbar region: Secondary | ICD-10-CM | POA: Diagnosis not present

## 2019-03-01 DIAGNOSIS — M9902 Segmental and somatic dysfunction of thoracic region: Secondary | ICD-10-CM | POA: Diagnosis not present

## 2019-03-09 DIAGNOSIS — M9903 Segmental and somatic dysfunction of lumbar region: Secondary | ICD-10-CM | POA: Diagnosis not present

## 2019-03-09 DIAGNOSIS — M4722 Other spondylosis with radiculopathy, cervical region: Secondary | ICD-10-CM | POA: Diagnosis not present

## 2019-03-09 DIAGNOSIS — M47817 Spondylosis without myelopathy or radiculopathy, lumbosacral region: Secondary | ICD-10-CM | POA: Diagnosis not present

## 2019-03-09 DIAGNOSIS — M9902 Segmental and somatic dysfunction of thoracic region: Secondary | ICD-10-CM | POA: Diagnosis not present

## 2019-03-09 DIAGNOSIS — M9901 Segmental and somatic dysfunction of cervical region: Secondary | ICD-10-CM | POA: Diagnosis not present

## 2019-03-09 DIAGNOSIS — M4724 Other spondylosis with radiculopathy, thoracic region: Secondary | ICD-10-CM | POA: Diagnosis not present

## 2019-03-15 DIAGNOSIS — M47817 Spondylosis without myelopathy or radiculopathy, lumbosacral region: Secondary | ICD-10-CM | POA: Diagnosis not present

## 2019-03-15 DIAGNOSIS — M9902 Segmental and somatic dysfunction of thoracic region: Secondary | ICD-10-CM | POA: Diagnosis not present

## 2019-03-15 DIAGNOSIS — M9903 Segmental and somatic dysfunction of lumbar region: Secondary | ICD-10-CM | POA: Diagnosis not present

## 2019-03-15 DIAGNOSIS — M4722 Other spondylosis with radiculopathy, cervical region: Secondary | ICD-10-CM | POA: Diagnosis not present

## 2019-03-15 DIAGNOSIS — M4724 Other spondylosis with radiculopathy, thoracic region: Secondary | ICD-10-CM | POA: Diagnosis not present

## 2019-03-15 DIAGNOSIS — M9901 Segmental and somatic dysfunction of cervical region: Secondary | ICD-10-CM | POA: Diagnosis not present

## 2019-03-16 ENCOUNTER — Ambulatory Visit: Payer: Medicare HMO | Admitting: Sports Medicine

## 2019-03-22 ENCOUNTER — Ambulatory Visit (INDEPENDENT_AMBULATORY_CARE_PROVIDER_SITE_OTHER): Payer: Medicare HMO | Admitting: Sports Medicine

## 2019-03-22 ENCOUNTER — Encounter: Payer: Self-pay | Admitting: Sports Medicine

## 2019-03-22 ENCOUNTER — Other Ambulatory Visit: Payer: Self-pay

## 2019-03-22 DIAGNOSIS — B351 Tinea unguium: Secondary | ICD-10-CM

## 2019-03-22 DIAGNOSIS — M722 Plantar fascial fibromatosis: Secondary | ICD-10-CM | POA: Diagnosis not present

## 2019-03-22 DIAGNOSIS — M79676 Pain in unspecified toe(s): Secondary | ICD-10-CM

## 2019-03-22 DIAGNOSIS — M79671 Pain in right foot: Secondary | ICD-10-CM

## 2019-03-22 DIAGNOSIS — E669 Obesity, unspecified: Secondary | ICD-10-CM

## 2019-03-22 DIAGNOSIS — E1169 Type 2 diabetes mellitus with other specified complication: Secondary | ICD-10-CM

## 2019-03-22 MED ORDER — TRIAMCINOLONE ACETONIDE 10 MG/ML IJ SUSP
10.0000 mg | Freq: Once | INTRAMUSCULAR | Status: AC
  Administered 2019-03-22: 10 mg

## 2019-03-22 NOTE — Progress Notes (Signed)
Subjective: Melanie Meyers is a 64 y.o. female patient with history of diabetes who presents to office today complaining of long,mildly painful nails  while ambulating in shoes; unable to trim.  Patient states that the glucose reading this morning was  89 mg/dl last A1c 7.1 and saw PCP on Dec. 2. Patient also admits that she is having some right heel pain.  Patient reports that the heel pain is worse with first few steps out of bed in the morning has been trying gentle stretching but still is painful and is requesting a shot of medicine since she will be in a wedding on next weekend.  No other issues noted.  Patient Active Problem List   Diagnosis Date Noted  . Pain in right knee 09/21/2018  . Major depressive disorder, recurrent episode with anxious distress ([place])   . Acquired hallux rigidus of right foot 04/13/2018  . Osteoarthritis of knee 03/28/2018  . Pes anserinus bursitis of right knee 03/28/2018  . CAD (coronary artery disease) 11/10/2017  . Osteoarthritis of finger of right hand 02/10/2017  . Chronic tension-type headache, not intractable 07/10/2016  . Cellulitis of great toe, left 06/17/2016  . Diabetic peripheral neuropathy ([place]) 05/17/2016  . HTN (hypertension) 06/20/2015  . Hyperlipidemia 06/20/2015  . Morbid obesity ([place]) 06/20/2015  . Metatarsal deformity 05/30/2014  . Metatarsalgia 05/30/2014  . Hand paresthesia 06/27/2013  . Hypothyroidism 06/02/2013  . Type 2 diabetes mellitus ([place]) 06/02/2013  . 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 Nerve root pain 01/27/2013  . Narrowing of intervertebral disc space 01/27/2013  . Myofascial pain 01/27/2013  . Cervical spine pain 11/24/2012  . Cervical osteoarthritis 11/24/2012  . Carpal tunnel syndrome 01/15/2012  . Asthma 01/20/2008   Current Outpatient Medications on File Prior to Visit  Medication Sig Dispense Refill  . ACCU-CHEK AVIVA PLUS test strip 1 each by Other route as needed  (for BS).     Marland Kitchen albuterol (VENTOLIN HFA) 108 (90 Base) MCG/ACT inhaler Inhale 2 puffs into the lungs every 6 (six) hours as needed for wheezing or shortness of breath.     Marland Kitchen aspirin EC 81 MG tablet Take 81 mg by mouth at bedtime.     . Azelastine HCl 137 MCG/SPRAY SOLN Place 1 spray into the nose as needed.     . Biotin 10000 MCG TABS Take 10,000 mcg by mouth at bedtime.    . budesonide-formoterol (SYMBICORT) 160-4.5 MCG/ACT inhaler Inhale two puffs twice daily with spacer to prevent cough or wheeze.  Rinse, gargle, and spit after use. 1 Inhaler 5  . cetirizine (ZYRTEC) 10 MG tablet Take 10 mg by mouth at bedtime.     Marland Kitchen co-enzyme Q-10 30 MG capsule Take 30 mg by mouth at bedtime.    . diclofenac sodium (VOLTAREN) 1 % GEL diclofenac 1 % topical gel  APPLY 2 GRAM TO THE AFFECTED AREA(S) BY TOPICAL ROUTE 2-3 TIMES PER DAY    . diclofenac Sodium (VOLTAREN) 1 % GEL     . esomeprazole (NEXIUM) 20 MG capsule esomeprazole magnesium 20 mg capsule,delayed release    . Insulin Glargine-Lixisenatide (SOLIQUA) 100-33 UNT-MCG/ML SOPN Inject 30 Units into the skin daily.    Marland Kitchen levothyroxine (SYNTHROID, LEVOTHROID) 25 MCG tablet Take 25 mcg by mouth daily before breakfast.    . lidocaine (LIDODERM) 5 % Place 1 patch onto the skin as needed.     . Magnesium 250 MG TABS Take 250 mg by mouth at bedtime.    Marland Kitchen  Melatonin 10 MG TABS Take 1 tablet by mouth at bedtime.    . Menthol, Topical Analgesic, (BIOFREEZE EX) Apply 1 application topically daily as needed (muscle pain).    . metFORMIN (GLUCOPHAGE) 1000 MG tablet Take 1 tablet (1,000 mg total) by mouth 2 (two) times daily with a meal.    . methocarbamol (ROBAXIN) 500 MG tablet Take 1 tablet (500 mg total) by mouth 3 (three) times daily. 30 tablet 0  . metoprolol succinate (TOPROL-XL) 25 MG 24 hr tablet TAKE 1 TABLET BY MOUTH EVERY DAY 90 tablet 1  . montelukast (SINGULAIR) 10 MG tablet Take 10 mg by mouth at bedtime.     . Multiple Vitamin (MULTIVITAMIN WITH  MINERALS) TABS tablet Take 1 tablet by mouth at bedtime.    . naproxen (NAPROSYN) 500 MG tablet Take 500 mg by mouth 2 (two) times daily with a meal.    . nitroGLYCERIN (NITROSTAT) 0.4 MG SL tablet Place 1 tablet (0.4 mg total) under the tongue every 5 (five) minutes as needed. 25 tablet 11  . olmesartan (BENICAR) 20 MG tablet TAKE 1/2 TABLET BY MOUTH EVERY DAY 15 tablet 4  . Omega-3 Fatty Acids (FISH OIL) 1000 MG CAPS Take 2,000 mg by mouth at bedtime.    . polyethylene glycol (MIRALAX / GLYCOLAX) packet Take 17 g by mouth daily as needed for mild constipation.     . potassium gluconate (HM POTASSIUM) 595 (99 K) MG TABS tablet Take 595 mg by mouth daily as needed (cramps).    . pravastatin (PRAVACHOL) 40 MG tablet Take 40 mg by mouth daily.     . pregabalin (LYRICA) 150 MG capsule Take 1 cap in the morning and 2 caps in the evening 270 capsule 3  . rizatriptan (MAXALT) 10 MG tablet Take 10 mg by mouth as needed for migraine.     . sertraline (ZOLOFT) 100 MG tablet     . sertraline (ZOLOFT) 50 MG tablet Take 50 mg by mouth at bedtime.    Marland Kitchen UNIFINE PENTIPS 31G X 6 MM MISC     . zolpidem (AMBIEN) 10 MG tablet      No current facility-administered medications on file prior to visit.   Allergies  Allergen Reactions  . Accupril [Quinapril Hcl] Cough  . Prednisone Other (See Comments)    Has to be cautious due to Blood Sugar  . Levaquin [Levofloxacin] Rash    No results found for this or any previous visit (from the past 2160 hour(s)).  Objective: General: Patient is awake, alert, and oriented x 3 and in no acute distress.  Integument: Skin is warm, dry and supple bilateral. Nails are tender, long, thickened and  dystrophic with subungual debris, consistent with onychomycosis, 1-5 bilateral. No signs of infection. No open lesions or preulcerative lesions present bilateral. Remaining integument unremarkable.  Vasculature:  Dorsalis Pedis pulse 1/4 bilateral. Posterior Tibial pulse  1/4  bilateral.  Capillary fill time <3 sec 1-5 bilateral. Positive hair growth to the level of the digits. Temperature gradient within normal limits. No varicosities present bilateral. No edema present bilateral.   Neurology: The patient has intact sensation measured with a 5.07/10g Semmes Weinstein Monofilament at all pedal sites bilateral . Vibratory sensation diminished bilateral with tuning fork. No Babinski sign present bilateral.   Musculoskeletal: There is tenderness palpation of the plantar fascial insertion of the right foot.  There is asymptomatic bunion and pes planus foot type noted bilateral.  Muscular strength 5/5 in all lower extremity muscular groups  bilateral without pain on range of motion except with mild guarding on the right foot due to pain at plantar fascia. No tenderness with calf compression bilateral.  Assessment and Plan: Problem List Items Addressed This Visit    None    Visit Diagnoses    Pain due to onychomycosis of nail    -  Primary   Diabetes mellitus type 2 in obese ([facility])       Plantar fasciitis, right       Relevant Medications   triamcinolone acetonide (KENALOG) 10 MG/ML injection 10 mg (Completed) (Start on 03/22/2019 12:00 PM)   Right foot pain          -Examined patient. -Discussed with patient heel pain -Recommend gentle stretching and icing as instructed -Re-discussed diabetic foot care, especially with regards to the vascular, neurological and musculoskeletal systems.  -Mechanically debrided all nails 1-5 bilateral using sterile nail nipper and filed with dremel without incident  After oral consent and aseptic prep, injected a mixture containing 1 ml of 2%  plain lidocaine, 1 ml 0.5% plain marcaine, 0.5 ml of kenalog 10 and 0.5 ml of dexamethasone phosphate into right heel at glabrous junction at plantar fascia insertion without complication. Post-injection care discussed with patient.  -Dispensed heel cushion for patient to use in shoes with her  diabetic insoles -Answered all patient questions -Patient to return  in 2.5-3 months for at risk foot care and as scheduled for diabetic shoes -Patient advised to call the office if any problems or questions arise in the meantime.  Landis Martins, DPM

## 2019-03-29 DIAGNOSIS — I10 Essential (primary) hypertension: Secondary | ICD-10-CM | POA: Diagnosis not present

## 2019-03-29 DIAGNOSIS — E785 Hyperlipidemia, unspecified: Secondary | ICD-10-CM | POA: Diagnosis not present

## 2019-03-29 DIAGNOSIS — F5104 Psychophysiologic insomnia: Secondary | ICD-10-CM | POA: Diagnosis not present

## 2019-03-29 DIAGNOSIS — M9903 Segmental and somatic dysfunction of lumbar region: Secondary | ICD-10-CM | POA: Diagnosis not present

## 2019-03-29 DIAGNOSIS — E039 Hypothyroidism, unspecified: Secondary | ICD-10-CM | POA: Diagnosis not present

## 2019-03-29 DIAGNOSIS — E559 Vitamin D deficiency, unspecified: Secondary | ICD-10-CM | POA: Diagnosis not present

## 2019-03-29 DIAGNOSIS — K5900 Constipation, unspecified: Secondary | ICD-10-CM | POA: Diagnosis not present

## 2019-03-29 DIAGNOSIS — M47817 Spondylosis without myelopathy or radiculopathy, lumbosacral region: Secondary | ICD-10-CM | POA: Diagnosis not present

## 2019-03-29 DIAGNOSIS — Z79899 Other long term (current) drug therapy: Secondary | ICD-10-CM | POA: Diagnosis not present

## 2019-03-29 DIAGNOSIS — M9901 Segmental and somatic dysfunction of cervical region: Secondary | ICD-10-CM | POA: Diagnosis not present

## 2019-03-29 DIAGNOSIS — M4722 Other spondylosis with radiculopathy, cervical region: Secondary | ICD-10-CM | POA: Diagnosis not present

## 2019-03-29 DIAGNOSIS — G43909 Migraine, unspecified, not intractable, without status migrainosus: Secondary | ICD-10-CM | POA: Diagnosis not present

## 2019-03-29 DIAGNOSIS — F339 Major depressive disorder, recurrent, unspecified: Secondary | ICD-10-CM | POA: Diagnosis not present

## 2019-03-29 DIAGNOSIS — M9902 Segmental and somatic dysfunction of thoracic region: Secondary | ICD-10-CM | POA: Diagnosis not present

## 2019-03-29 DIAGNOSIS — E1149 Type 2 diabetes mellitus with other diabetic neurological complication: Secondary | ICD-10-CM | POA: Diagnosis not present

## 2019-03-29 DIAGNOSIS — M4724 Other spondylosis with radiculopathy, thoracic region: Secondary | ICD-10-CM | POA: Diagnosis not present

## 2019-03-31 ENCOUNTER — Ambulatory Visit: Payer: Medicare HMO | Admitting: Sports Medicine

## 2019-04-04 DIAGNOSIS — M4722 Other spondylosis with radiculopathy, cervical region: Secondary | ICD-10-CM | POA: Diagnosis not present

## 2019-04-04 DIAGNOSIS — M9903 Segmental and somatic dysfunction of lumbar region: Secondary | ICD-10-CM | POA: Diagnosis not present

## 2019-04-04 DIAGNOSIS — M9902 Segmental and somatic dysfunction of thoracic region: Secondary | ICD-10-CM | POA: Diagnosis not present

## 2019-04-04 DIAGNOSIS — M4724 Other spondylosis with radiculopathy, thoracic region: Secondary | ICD-10-CM | POA: Diagnosis not present

## 2019-04-04 DIAGNOSIS — M47817 Spondylosis without myelopathy or radiculopathy, lumbosacral region: Secondary | ICD-10-CM | POA: Diagnosis not present

## 2019-04-04 DIAGNOSIS — M9901 Segmental and somatic dysfunction of cervical region: Secondary | ICD-10-CM | POA: Diagnosis not present

## 2019-04-18 ENCOUNTER — Telehealth: Payer: Self-pay

## 2019-04-18 NOTE — Telephone Encounter (Signed)
Spoke with patient in regards to her recent [employer]. Dr. Bettina Gavia had requested that she be seen by either him or Dr. Harriet Masson next week. She will be seen on 04/25/19 at 8:05 AM. She verbalizes understanding of this.    Encouraged patient to call back with any questions or concerns.

## 2019-04-19 ENCOUNTER — Other Ambulatory Visit: Payer: Self-pay

## 2019-04-19 ENCOUNTER — Ambulatory Visit: Payer: Medicare HMO | Admitting: Orthotics

## 2019-04-19 ENCOUNTER — Other Ambulatory Visit: Payer: Medicare HMO | Admitting: Orthotics

## 2019-04-19 DIAGNOSIS — M722 Plantar fascial fibromatosis: Secondary | ICD-10-CM

## 2019-04-19 DIAGNOSIS — B351 Tinea unguium: Secondary | ICD-10-CM

## 2019-04-19 DIAGNOSIS — E669 Obesity, unspecified: Secondary | ICD-10-CM

## 2019-04-19 DIAGNOSIS — M79609 Pain in unspecified limb: Secondary | ICD-10-CM

## 2019-04-19 DIAGNOSIS — E1169 Type 2 diabetes mellitus with other specified complication: Secondary | ICD-10-CM

## 2019-04-19 NOTE — Progress Notes (Signed)

## 2019-04-24 DIAGNOSIS — J309 Allergic rhinitis, unspecified: Secondary | ICD-10-CM | POA: Diagnosis not present

## 2019-04-24 DIAGNOSIS — G4733 Obstructive sleep apnea (adult) (pediatric): Secondary | ICD-10-CM | POA: Diagnosis not present

## 2019-04-24 DIAGNOSIS — R918 Other nonspecific abnormal finding of lung field: Secondary | ICD-10-CM | POA: Diagnosis not present

## 2019-04-24 DIAGNOSIS — D869 Sarcoidosis, unspecified: Secondary | ICD-10-CM | POA: Diagnosis not present

## 2019-04-25 ENCOUNTER — Encounter: Payer: Self-pay | Admitting: Cardiology

## 2019-04-25 ENCOUNTER — Other Ambulatory Visit: Payer: Self-pay

## 2019-04-25 ENCOUNTER — Ambulatory Visit: Payer: Medicare HMO

## 2019-04-25 ENCOUNTER — Ambulatory Visit (INDEPENDENT_AMBULATORY_CARE_PROVIDER_SITE_OTHER): Payer: Medicare HMO | Admitting: Cardiology

## 2019-04-25 VITALS — BP 102/68 | HR 72 | Ht 60.0 in | Wt 231.0 lb

## 2019-04-25 DIAGNOSIS — I2510 Atherosclerotic heart disease of native coronary artery without angina pectoris: Secondary | ICD-10-CM | POA: Diagnosis not present

## 2019-04-25 DIAGNOSIS — R42 Dizziness and giddiness: Secondary | ICD-10-CM | POA: Diagnosis not present

## 2019-04-25 DIAGNOSIS — E782 Mixed hyperlipidemia: Secondary | ICD-10-CM

## 2019-04-25 DIAGNOSIS — Z794 Long term (current) use of insulin: Secondary | ICD-10-CM

## 2019-04-25 DIAGNOSIS — R011 Cardiac murmur, unspecified: Secondary | ICD-10-CM | POA: Diagnosis not present

## 2019-04-25 DIAGNOSIS — R000 Tachycardia, unspecified: Secondary | ICD-10-CM

## 2019-04-25 DIAGNOSIS — R002 Palpitations: Secondary | ICD-10-CM | POA: Diagnosis not present

## 2019-04-25 DIAGNOSIS — E119 Type 2 diabetes mellitus without complications: Secondary | ICD-10-CM | POA: Diagnosis not present

## 2019-04-25 NOTE — Patient Instructions (Addendum)
Medication Instructions:  Your physician recommends that you continue on your current medications as directed. Please refer to the Current Medication list given to you today.  *If you need a refill on your cardiac medications before your next appointment, please call your pharmacy*  Lab Work: If you have labs (blood work) drawn today and your tests are completely normal, you will receive your results only by: Marland Kitchen MyChart Message (if you have MyChart) OR . A paper copy in the mail If you have any lab test that is abnormal or we need to change your treatment, we will call you to review the results.  Testing/Procedures:  Your physician has requested that you have an echocardiogram. Echocardiography is a painless test that uses sound waves to create images of your heart. It provides your doctor with information about the size and shape of your heart and how well your heart's chambers and valves are working. This procedure takes approximately one hour. There are no restrictions for this procedure.  ZIO XT- Long Term Monitor Instructions   Your physician has requested you wear your ZIO patch monitor___3____days.   This is a single patch monitor.  Irhythm supplies one patch monitor per enrollment.  Additional stickers are not available.   Please do not apply patch if you will be having a Nuclear Stress Test, Echocardiogram, Cardiac CT, MRI, or Chest Xray during the time frame you would be wearing the monitor. The patch cannot be worn during these tests.  You cannot remove and re-apply the ZIO XT patch monitor.   Your ZIO patch monitor will be sent USPS Priority mail from [facility] directly to your home address. The monitor may also be mailed to a PO BOX if home delivery is not available.   It may take 3-5 days to receive your monitor after you have been enrolled.   Once you have received you monitor, please review enclosed instructions.  Your monitor has already been registered assigning a  specific monitor serial # to you.   Applying the monitor   Shave hair from upper left chest.   Hold abrader disc by orange tab.  Rub abrader in 40 strokes over left upper chest as indicated in your monitor instructions.   Clean area with 4 enclosed alcohol pads .  Use all pads to assure are is cleaned thoroughly.  Let dry.   Apply patch as indicated in monitor instructions.  Patch will be place under collarbone on left side of chest with arrow pointing upward.   Rub patch adhesive wings for 2 minutes.Remove white label marked "1".  Remove white label marked "2".  Rub patch adhesive wings for 2 additional minutes.   While looking in a mirror, press and release button in center of patch.  A small green light will flash 3-4 times .  This will be your only indicator the monitor has been turned on.     Do not shower for the first 24 hours.  You may shower after the first 24 hours.   Press button if you feel a symptom. You will hear a small click.  Record Date, Time and Symptom in the Patient Log Book.   When you are ready to remove patch, follow instructions on last 2 pages of Patient Log Book.  Stick patch monitor onto last page of Patient Log Book.   Place Patient Log Book in [place] box.  Use locking tab on box and tape box closed securely.  The [place] and [employer] has prepaid  postage on it.  Please place in mailbox as soon as possible.  Your physician should have your test results approximately 7 days after the monitor has been mailed back to [facility].   Call [place] at [phone] if you have questions regarding your ZIO XT patch monitor.  Call them immediately if you see an orange light blinking on your monitor.   If your monitor falls off in less than 4 days contact our Monitor department at [phone].  If your monitor becomes loose or falls off after 4 days call [employer] at [phone] for suggestions on securing your monitor.    Follow-Up: At [facility], you and your health needs are our priority.  As part of our continuing mission to provide you with exceptional heart care, we have created designated Provider Care Teams.  These Care Teams include your primary Cardiologist (physician) and Advanced Practice Providers (APPs -  Physician Assistants and Nurse Practitioners) who all work together to provide you with the care you need, when you need it.  We recommend signing up for the patient portal called "MyChart".  Sign up information is provided on this After Visit Summary.  MyChart is used to connect with patients for Virtual Visits (Telemedicine).  Patients are able to view lab/test results, encounter notes, upcoming appointments, etc.  Non-urgent messages can be sent to your provider as well.   To learn more about what you can do with MyChart, go to [email or web address].    Your next appointment:   3 month(s)  The format for your next appointment:   In Person  Provider:    You will see Dr. Bettina Gavia

## 2019-04-25 NOTE — Progress Notes (Signed)
Cardiology Office Note:    Date:  04/25/2019   ID:  Melanie Meyers, DOB 01/25/1955, MRN [id number]  PCP:  Janine Limbo, PA-C  Cardiologist:  No primary care provider on file.  Electrophysiologist:  None   Referring MD: Janine Limbo, PA-C   Chief Complaint  Patient presents with  . Tachycardia    History of Present Illness:    Melanie Meyers is a 64 y.o. female with a hx of coronary artery disease, diabetes mellitus, hypertension, hyperlipidemia, hypothyroidism presents today to be evaluated for palpitations.  Patient tells me that over the last several weeks she has experienced significant palpitations.  She notes that what was different is she changed her thyroid medicine she was taking liothythrininne. She note that she has since changed from the medication to Synthroid.  The symptoms has improved but not completely gone.  She tells me as abrupt fast heartbeat does not matter what she is doing as fast last for seconds and resolved.  Usually resolves without any action.  No other complaints at this time.  Past Medical History:  Diagnosis Date  . Asthma   . B12 deficiency   . Back pain   . CAD (coronary artery disease) 11/10/2017  . Carpal tunnel syndrome of right wrist 01/15/2012  . Chronic tension-type headache, not intractable 07/10/2016  . Degenerative arthritis    degenerative arthritis of neck  . Depression   . Diabetic peripheral neuropathy ([place]) 05/17/2016  . Fibromyalgia   . Generalized anxiety disorder   . GERD (gastroesophageal reflux disease)   . Hyperlipidemia 06/20/2015  . Hypertension   . Hypothyroidism 06/02/2013  . Insomnia   . Major depressive disorder, recurrent episode with anxious distress ([place])   . Obstructive sleep apnea    On CPAP  . Sarcoidosis   . Transient alteration of awareness 10/10/2015  . Type II Diabetes Mellitus without complication ([place])   . Vitamin D deficiency     Past Surgical History:  Procedure Laterality Date  .  ANTERIOR CERVICAL DECOMP/DISCECTOMY FUSION N/A 10/06/2017   Procedure: ANTERIOR CERVICAL DECOMPRESSION/DISCECTOMY FUSION C4-7;  Surgeon: Melina Schools, MD;  Location: [facility];  Service: Orthopedics;  Laterality: N/A;  4.5 hrs  . BREAST REDUCTION SURGERY  02/18/2015  . CARPAL TUNNEL RELEASE  2014  . CARPAL TUNNEL RELEASE  04/2016  . [place]  . LEFT HEART CATH AND CORONARY ANGIOGRAPHY N/A 09/29/2017   Procedure: LEFT HEART CATH AND CORONARY ANGIOGRAPHY;  Surgeon: Martinique, Peter M, MD;  Location: [facility];  Service: Cardiovascular;  Laterality: N/A;  . NASAL POLYP SURGERY  1983  . SINOSCOPY  10/09/2014  . TONSILLECTOMY  1978  . TUBAL LIGATION      Current Medications: Current Meds  Medication Sig  . ACCU-CHEK AVIVA PLUS test strip 1 each by Other route as needed (for BS).   Marland Kitchen albuterol (VENTOLIN HFA) 108 (90 Base) MCG/ACT inhaler Inhale 2 puffs into the lungs every 6 (six) hours as needed for wheezing or shortness of breath.   Marland Kitchen aspirin EC 81 MG tablet Take 81 mg by mouth at bedtime.   . Azelastine HCl 137 MCG/SPRAY SOLN Place 1 spray into the nose as needed.   . Biotin 10000 MCG TABS Take 10,000 mcg by mouth at bedtime.  . cetirizine (ZYRTEC) 10 MG tablet Take 10 mg by mouth at bedtime.   Marland Kitchen co-enzyme Q-10 30 MG capsule Take 30 mg by mouth at bedtime.  . Insulin Glargine-Lixisenatide (SOLIQUA) 100-33  UNT-MCG/ML SOPN Inject 30 Units into the skin daily.  Marland Kitchen levothyroxine (SYNTHROID, LEVOTHROID) 25 MCG tablet Take 25 mcg by mouth daily before breakfast.  . lidocaine (LIDODERM) 5 % Place 1 patch onto the skin as needed.   Marland Kitchen LINZESS 145 MCG CAPS capsule 145 mcg as needed.  . Magnesium 250 MG TABS Take 250 mg by mouth at bedtime.  . Melatonin 10 MG TABS Take 1 tablet by mouth at bedtime.  . Menthol, Topical Analgesic, (BIOFREEZE EX) Apply 1 application topically daily as needed (muscle pain).  . metFORMIN (GLUCOPHAGE) 1000 MG tablet Take 1 tablet (1,000 mg  total) by mouth 2 (two) times daily with a meal.  . methocarbamol (ROBAXIN) 500 MG tablet Take 1 tablet (500 mg total) by mouth 3 (three) times daily.  . metoprolol succinate (TOPROL-XL) 25 MG 24 hr tablet TAKE 1 TABLET BY MOUTH EVERY DAY  . montelukast (SINGULAIR) 10 MG tablet Take 10 mg by mouth at bedtime.   . Multiple Vitamin (MULTIVITAMIN WITH MINERALS) TABS tablet Take 1 tablet by mouth at bedtime.  . naproxen (NAPROSYN) 500 MG tablet Take 500 mg by mouth as needed.   . nitroGLYCERIN (NITROSTAT) 0.4 MG SL tablet Place 1 tablet (0.4 mg total) under the tongue every 5 (five) minutes as needed.  Marland Kitchen olmesartan (BENICAR) 20 MG tablet TAKE 1/2 TABLET BY MOUTH EVERY DAY  . Omega-3 Fatty Acids (FISH OIL) 1000 MG CAPS Take 2,000 mg by mouth at bedtime.  . polyethylene glycol (MIRALAX / GLYCOLAX) packet Take 17 g by mouth daily as needed for mild constipation.   . potassium gluconate (HM POTASSIUM) 595 (99 K) MG TABS tablet Take 595 mg by mouth daily as needed (cramps).  . pravastatin (PRAVACHOL) 40 MG tablet Take 40 mg by mouth daily.   . pregabalin (LYRICA) 150 MG capsule Take 1 cap in the morning and 2 caps in the evening  . rizatriptan (MAXALT) 10 MG tablet Take 10 mg by mouth as needed for migraine.   . sertraline (ZOLOFT) 50 MG tablet Take 50 mg by mouth at bedtime.  Marland Kitchen UNIFINE PENTIPS 31G X 6 MM MISC   . zolpidem (AMBIEN) 10 MG tablet      Allergies:   Accupril [quinapril hcl], Prednisone, and Levaquin [levofloxacin]   Social History   Socioeconomic History  . Marital status: Widowed    Spouse name: Not on file  . Number of children: Not on file  . Years of education: 52  . Highest education level: High school graduate  Occupational History  . Not on file  Tobacco Use  . Smoking status: Never Smoker  . Smokeless tobacco: Never Used  Substance and Sexual Activity  . Alcohol use: No    Alcohol/week: 0.0 standard drinks  . Drug use: No  . Sexual activity: Not on file  Other  Topics Concern  . Not on file  Social History Narrative  . Not on file   Social Determinants of Health   Financial Resource Strain:   . Difficulty of Paying Living Expenses:   Food Insecurity:   . Worried About Charity fundraiser in the Last Year:   . Arboriculturist in the Last Year:   Transportation Needs:   . Film/video editor (Medical):   Marland Kitchen Lack of Transportation (Non-Medical):   Physical Activity:   . Days of Exercise per Week:   . Minutes of Exercise per Session:   Stress:   . Feeling of Stress :  Social Connections:   . Frequency of Communication with Friends and Family:   . Frequency of Social Gatherings with Friends and Family:   . Attends Religious Services:   . Active Member of Clubs or Organizations:   . Attends Archivist Meetings:   Marland Kitchen Marital Status:      Family History: The patient's family history includes Alzheimer's disease in her mother; Diabetes in her mother; Gout in her mother; Heart attack in her father and sister; High Cholesterol in her father; High blood pressure in her father; Parkinson's disease in her mother; Thyroid disease in her sister, sister, and sister.  ROS:   Review of Systems  Constitution: Negative for decreased appetite, fever and weight gain.  HENT: Negative for congestion, ear discharge, hoarse voice and sore throat.   Eyes: Negative for discharge, redness, vision loss in right eye and visual halos.  Cardiovascular: Negative for chest pain, dyspnea on exertion, leg swelling, orthopnea and palpitations.  Respiratory: Negative for cough, hemoptysis, shortness of breath and snoring.   Endocrine: Negative for heat intolerance and polyphagia.  Hematologic/Lymphatic: Negative for bleeding problem. Does not bruise/bleed easily.  Skin: Negative for flushing, nail changes, rash and suspicious lesions.  Musculoskeletal: Negative for arthritis, joint pain, muscle cramps, myalgias, neck pain and stiffness.  Gastrointestinal:  Negative for abdominal pain, bowel incontinence, diarrhea and excessive appetite.  Genitourinary: Negative for decreased libido, genital sores and incomplete emptying.  Neurological: Negative for brief paralysis, focal weakness, headaches and loss of balance.  Psychiatric/Behavioral: Negative for altered mental status, depression and suicidal ideas.  Allergic/Immunologic: Negative for HIV exposure and persistent infections.    EKGs/Labs/Other Studies Reviewed:    The following studies were reviewed today:   EKG:  The ekg ordered today demonstrates sinus rhythm, heart rate 67 bpm  Left heart catheterization in September 2019 shows  Prox RCA lesion is 30% stenosed.  Prox LAD lesion is 35% stenosed.  Ost Ramus to Ramus lesion is 50% stenosed.  The left ventricular systolic function is normal.  LV end diastolic pressure is normal.  The left ventricular ejection fraction is 55-65% by visual estimate.   1. Nonobstructive CAD 2. Normal LV function 3. Normal LVEDP  Recent Labs: 08/24/2018: ALT 10; BUN 20; Creatinine, Ser 1.12; Potassium 4.6; Sodium 140  Recent Lipid Panel    Component Value Date/Time   CHOL 181 10/18/2018 0822   TRIG 250 (H) 10/18/2018 0822   HDL 55 10/18/2018 0822   CHOLHDL 3.3 10/18/2018 0822   LDLCALC 85 10/18/2018 0822    Physical Exam:    VS:  BP 102/68 (BP Location: Right Arm, Patient Position: Sitting, Cuff Size: Large)   Pulse 72   Ht 5' (1.524 m)   Wt 231 lb (104.8 kg)   LMP  (LMP Unknown) Comment: tubal ligation  SpO2 99%   BMI 45.11 kg/m     Wt Readings from Last 3 Encounters:  04/25/19 231 lb (104.8 kg)  11/24/18 228 lb (103.4 kg)  08/24/18 215 lb 3.2 oz (97.6 kg)     GEN: Well nourished, well developed in no acute distress HEENT: Normal NECK: No JVD; No carotid bruits LYMPHATICS: No lymphadenopathy CARDIAC: S1S2 noted,RRR, 1/6 systolic mu murmurs, rubs, gallops RESPIRATORY:  Clear to auscultation without rales, wheezing or rhonchi   ABDOMEN: Soft, non-tender, non-distended, +bowel sounds, no guarding. EXTREMITIES: No edema, No cyanosis, no clubbing MUSCULOSKELETAL:  No deformity  SKIN: Warm and dry NEUROLOGIC:  Alert and oriented x 3, non-focal PSYCHIATRIC:  Normal affect, good  insight  ASSESSMENT:    1. Palpitations   2. Tachycardia   3. Coronary artery disease involving native coronary artery of native heart without angina pectoris   4. Dizziness   5. Systolic murmur   6. Morbid obesity ([place])   7. Mixed hyperlipidemia   8. Insulin-requiring or dependent type II diabetes mellitus ([place])    PLAN:    1.  Palpitations-symptoms has improved but not resolved since her thyroid medication change.  I like to place a 3-day ZIO monitor the patient to make sure no factor of any arrhythmias.  2.  Physical exam concern for systolic murmur-in the setting her palpitations I reviewed her chart there is no recent 2D echo I am therefore going to order a 2D echocardiogram to assess for any blood abnormalities, or any structural abnormalities.  3.  CAD-continue her current medication regimen.  4.  Morbid obesity-the patient understands the need to lose weight with diet and exercise. We have discussed specific strategies for this.  5.  Hyperlipidemia-I reviewed her recent lipid profile with her PCP on March 29, 2019 which show HDL 55, LDL 86, total cholesterol 183, triglyceride 250.Marland Kitchen  No changes will be made she will be continued on her pravastatin 40 mg daily.  6.  Diabetes mellitus -continue patient on her current insulin regimen per PCP.  The patient is in agreement with the above plan. The patient left the office in stable condition.  The patient will follow up in 3 months with Dr. Bettina Gavia   Medication Adjustments/Labs and Tests Ordered: Current medicines are reviewed at length with the patient today.  Concerns regarding medicines are outlined above.  Orders Placed This Encounter  Procedures  . LONG TERM MONITOR (3-14  DAYS)  . EKG 12-Lead  . ECHOCARDIOGRAM COMPLETE   No orders of the defined types were placed in this encounter.   Patient Instructions  Medication Instructions:  Your physician recommends that you continue on your current medications as directed. Please refer to the Current Medication list given to you today.  *If you need a refill on your cardiac medications before your next appointment, please call your pharmacy*  Lab Work: If you have labs (blood work) drawn today and your tests are completely normal, you will receive your results only by: Marland Kitchen MyChart Message (if you have MyChart) OR . A paper copy in the mail If you have any lab test that is abnormal or we need to change your treatment, we will call you to review the results.  Testing/Procedures:  Your physician has requested that you have an echocardiogram. Echocardiography is a painless test that uses sound waves to create images of your heart. It provides your doctor with information about the size and shape of your heart and how well your heart's chambers and valves are working. This procedure takes approximately one hour. There are no restrictions for this procedure.  ZIO XT- Long Term Monitor Instructions   Your physician has requested you wear your ZIO patch monitor___3____days.   This is a single patch monitor.  [employer] supplies one patch monitor per enrollment.  Additional stickers are not available.   Please do not apply patch if you will be having a Nuclear Stress Test, Echocardiogram, Cardiac CT, MRI, or Chest Xray during the time frame you would be wearing the monitor. The patch cannot be worn during these tests.  You cannot remove and re-apply the ZIO XT patch monitor.   Your ZIO patch monitor will be sent USPS Priority mail from  [place] directly to your home address. The monitor may also be mailed to a PO BOX if home delivery is not available.   It may take 3-5 days to receive your monitor after you have  been enrolled.   Once you have received you monitor, please review enclosed instructions.  Your monitor has already been registered assigning a specific monitor serial # to you.   Applying the monitor   Shave hair from upper left chest.   Hold abrader disc by orange tab.  Rub abrader in 40 strokes over left upper chest as indicated in your monitor instructions.   Clean area with 4 enclosed alcohol pads .  Use all pads to assure are is cleaned thoroughly.  Let dry.   Apply patch as indicated in monitor instructions.  Patch will be place under collarbone on left side of chest with arrow pointing upward.   Rub patch adhesive wings for 2 minutes.Remove white label marked "1".  Remove white label marked "2".  Rub patch adhesive wings for 2 additional minutes.   While looking in a mirror, press and release button in center of patch.  A small green light will flash 3-4 times .  This will be your only indicator the monitor has been turned on.     Do not shower for the first 24 hours.  You may shower after the first 24 hours.   Press button if you feel a symptom. You will hear a small click.  Record Date, Time and Symptom in the Patient Log Book.   When you are ready to remove patch, follow instructions on last 2 pages of Patient Log Book.  Stick patch monitor onto last page of Patient Log Book.   Place Patient Log Book in [place] box.  Use locking tab on box and tape box closed securely.  The [place] and [employer] has [employer] on it.  Please place in mailbox as soon as possible.  Your physician should have your test results approximately 7 days after the monitor has been mailed back to [facility].   Call [place] at [phone] if you have questions regarding your ZIO XT patch monitor.  Call them immediately if you see an orange light blinking on your monitor.   If your monitor falls off in less than 4 days contact our Monitor department at [phone].  If your  monitor becomes loose or falls off after 4 days call [employer] at [phone] for suggestions on securing your monitor.    Follow-Up: At [facility], you and your health needs are our priority.  As part of our continuing mission to provide you with exceptional heart care, we have created designated Provider Care Teams.  These Care Teams include your primary Cardiologist (physician) and Advanced Practice Providers (APPs -  Physician Assistants and Nurse Practitioners) who all work together to provide you with the care you need, when you need it.  We recommend signing up for the patient portal called "MyChart".  Sign up information is provided on this After Visit Summary.  MyChart is used to connect with patients for Virtual Visits (Telemedicine).  Patients are able to view lab/test results, encounter notes, upcoming appointments, etc.  Non-urgent messages can be sent to your provider as well.   To learn more about what you can do with MyChart, go to [email or web address].    Your next appointment:   3 month(s)  The format for your next appointment:   In Person  Provider:  You will see Dr. Bettina Gavia        Adopting a Healthy Lifestyle.  Know what a healthy weight is for you (roughly BMI <25) and aim to maintain this   Aim for 7+ servings of fruits and vegetables daily   65-80+ fluid ounces of water or unsweet tea for healthy kidneys   Limit to max 1 drink of alcohol per day; avoid smoking/tobacco   Limit animal fats in diet for cholesterol and heart health - choose grass fed whenever available   Avoid highly processed foods, and foods high in saturated/trans fats   Aim for low stress - take time to unwind and care for your mental health   Aim for 150 min of moderate intensity exercise weekly for heart health, and weights twice weekly for bone health   Aim for 7-9 hours of sleep daily   When it comes to diets, agreement about the perfect plan isnt easy to find, even  among the experts. Experts at the [place] developed an idea known as the Healthy Eating Plate. Just imagine a plate divided into logical, healthy portions.   The emphasis is on diet quality:   Load up on vegetables and fruits - one-half of your plate: Aim for color and variety, and remember that potatoes dont count.   Go for whole grains - one-quarter of your plate: Whole wheat, barley, wheat berries, quinoa, oats, brown rice, and foods made with them. If you want pasta, go with whole wheat pasta.   Protein power - one-quarter of your plate: Fish, chicken, beans, and nuts are all healthy, versatile protein sources. Limit red meat.   The diet, however, does go beyond the plate, offering a few other suggestions.   Use healthy plant oils, such as olive, canola, soy, corn, sunflower and peanut. Check the labels, and avoid partially hydrogenated oil, which have unhealthy trans fats.   If youre thirsty, drink water. Coffee and tea are good in moderation, but skip sugary drinks and limit milk and dairy products to one or two daily servings.   The type of carbohydrate in the diet is more important than the amount. Some sources of carbohydrates, such as vegetables, fruits, whole grains, and beans-are healthier than others.   Finally, stay active  Signed, Berniece Salines, DO  04/25/2019 8:44 AM    [place]

## 2019-04-26 DIAGNOSIS — M9901 Segmental and somatic dysfunction of cervical region: Secondary | ICD-10-CM | POA: Diagnosis not present

## 2019-04-26 DIAGNOSIS — M4724 Other spondylosis with radiculopathy, thoracic region: Secondary | ICD-10-CM | POA: Diagnosis not present

## 2019-04-26 DIAGNOSIS — M9902 Segmental and somatic dysfunction of thoracic region: Secondary | ICD-10-CM | POA: Diagnosis not present

## 2019-04-26 DIAGNOSIS — M4722 Other spondylosis with radiculopathy, cervical region: Secondary | ICD-10-CM | POA: Diagnosis not present

## 2019-04-26 DIAGNOSIS — M47817 Spondylosis without myelopathy or radiculopathy, lumbosacral region: Secondary | ICD-10-CM | POA: Diagnosis not present

## 2019-04-26 DIAGNOSIS — M9903 Segmental and somatic dysfunction of lumbar region: Secondary | ICD-10-CM | POA: Diagnosis not present

## 2019-04-30 ENCOUNTER — Encounter: Payer: Self-pay | Admitting: Sports Medicine

## 2019-05-02 DIAGNOSIS — L84 Corns and callosities: Secondary | ICD-10-CM | POA: Diagnosis not present

## 2019-05-02 DIAGNOSIS — E1165 Type 2 diabetes mellitus with hyperglycemia: Secondary | ICD-10-CM | POA: Diagnosis not present

## 2019-05-02 DIAGNOSIS — Z6841 Body Mass Index (BMI) 40.0 and over, adult: Secondary | ICD-10-CM | POA: Diagnosis not present

## 2019-05-02 DIAGNOSIS — E1140 Type 2 diabetes mellitus with diabetic neuropathy, unspecified: Secondary | ICD-10-CM | POA: Diagnosis not present

## 2019-05-02 DIAGNOSIS — E1149 Type 2 diabetes mellitus with other diabetic neurological complication: Secondary | ICD-10-CM | POA: Diagnosis not present

## 2019-05-02 DIAGNOSIS — G4733 Obstructive sleep apnea (adult) (pediatric): Secondary | ICD-10-CM | POA: Diagnosis not present

## 2019-05-04 ENCOUNTER — Ambulatory Visit (INDEPENDENT_AMBULATORY_CARE_PROVIDER_SITE_OTHER): Payer: Medicare HMO | Admitting: Sports Medicine

## 2019-05-04 ENCOUNTER — Encounter: Payer: Self-pay | Admitting: Sports Medicine

## 2019-05-04 ENCOUNTER — Ambulatory Visit (INDEPENDENT_AMBULATORY_CARE_PROVIDER_SITE_OTHER): Payer: Medicare HMO

## 2019-05-04 ENCOUNTER — Other Ambulatory Visit: Payer: Self-pay

## 2019-05-04 DIAGNOSIS — I2510 Atherosclerotic heart disease of native coronary artery without angina pectoris: Secondary | ICD-10-CM

## 2019-05-04 DIAGNOSIS — M7661 Achilles tendinitis, right leg: Secondary | ICD-10-CM

## 2019-05-04 DIAGNOSIS — R000 Tachycardia, unspecified: Secondary | ICD-10-CM

## 2019-05-04 DIAGNOSIS — E669 Obesity, unspecified: Secondary | ICD-10-CM

## 2019-05-04 DIAGNOSIS — R42 Dizziness and giddiness: Secondary | ICD-10-CM

## 2019-05-04 DIAGNOSIS — E1169 Type 2 diabetes mellitus with other specified complication: Secondary | ICD-10-CM

## 2019-05-04 DIAGNOSIS — M722 Plantar fascial fibromatosis: Secondary | ICD-10-CM

## 2019-05-04 DIAGNOSIS — R002 Palpitations: Secondary | ICD-10-CM

## 2019-05-04 DIAGNOSIS — M79671 Pain in right foot: Secondary | ICD-10-CM

## 2019-05-04 MED ORDER — TRIAMCINOLONE ACETONIDE 40 MG/ML IJ SUSP
20.0000 mg | Freq: Once | INTRAMUSCULAR | Status: AC
  Administered 2019-05-04: 20 mg

## 2019-05-04 MED ORDER — DICLOFENAC SODIUM 75 MG PO TBEC: 75.0000 mg | DELAYED_RELEASE_TABLET | Freq: Two times a day (BID) | ORAL | 0 refills | Status: DC
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
## 2019-05-04 NOTE — Progress Notes (Signed)
Complete echocardiogram has been performed.  Jimmy Hilton Saephan RDCS, RVT 

## 2019-05-05 ENCOUNTER — Telehealth: Payer: Self-pay

## 2019-05-05 NOTE — Telephone Encounter (Signed)
Released to [facility] with questions.

## 2019-05-05 NOTE — Telephone Encounter (Signed)
-----   Message from Berniece Salines, DO sent at 05/05/2019 11:21 AM EDT ----- The echo showed that the heart is not fully relaxing like it should ( diastolic dysfunction) ,but otherwise normal. I will discuss it at the next office visit.

## 2019-05-08 DIAGNOSIS — M9901 Segmental and somatic dysfunction of cervical region: Secondary | ICD-10-CM | POA: Diagnosis not present

## 2019-05-08 DIAGNOSIS — M4724 Other spondylosis with radiculopathy, thoracic region: Secondary | ICD-10-CM | POA: Diagnosis not present

## 2019-05-08 DIAGNOSIS — M4722 Other spondylosis with radiculopathy, cervical region: Secondary | ICD-10-CM | POA: Diagnosis not present

## 2019-05-08 DIAGNOSIS — M9903 Segmental and somatic dysfunction of lumbar region: Secondary | ICD-10-CM | POA: Diagnosis not present

## 2019-05-08 DIAGNOSIS — M9902 Segmental and somatic dysfunction of thoracic region: Secondary | ICD-10-CM | POA: Diagnosis not present

## 2019-05-08 DIAGNOSIS — M47817 Spondylosis without myelopathy or radiculopathy, lumbosacral region: Secondary | ICD-10-CM | POA: Diagnosis not present

## 2019-05-09 ENCOUNTER — Telehealth: Payer: Self-pay | Admitting: Cardiology

## 2019-05-09 NOTE — Telephone Encounter (Signed)
Patient states that she had an echo done that showed her heart is not fully relaxing. She has a f/u appt with Dr. Bettina Gavia on 07/25/19. Patient wants to know if she should wait that long to discuss this.

## 2019-05-09 NOTE — Telephone Encounter (Signed)
Spoke with the patient and let her know that Dr. Bettina Gavia said that she would be fine to keep her appointment on 07/25/19 and he will discuss the echo results with her then.

## 2019-05-15 DIAGNOSIS — R002 Palpitations: Secondary | ICD-10-CM | POA: Diagnosis not present

## 2019-05-17 ENCOUNTER — Other Ambulatory Visit: Payer: Self-pay | Admitting: Cardiology

## 2019-05-18 ENCOUNTER — Other Ambulatory Visit: Payer: Self-pay | Admitting: Sports Medicine

## 2019-05-18 ENCOUNTER — Encounter: Payer: Self-pay | Admitting: Sports Medicine

## 2019-05-18 DIAGNOSIS — M7661 Achilles tendinitis, right leg: Secondary | ICD-10-CM

## 2019-05-18 DIAGNOSIS — M722 Plantar fascial fibromatosis: Secondary | ICD-10-CM

## 2019-05-18 MED ORDER — DICLOFENAC SODIUM 75 MG PO TBEC: 75.0000 mg | DELAYED_RELEASE_TABLET | Freq: Two times a day (BID) | ORAL | 0 refills | Status: DC

## 2019-05-18 NOTE — Progress Notes (Signed)
Refilled diclofenac

## 2019-05-19 ENCOUNTER — Encounter: Payer: Self-pay | Admitting: Sports Medicine

## 2019-05-19 ENCOUNTER — Other Ambulatory Visit: Payer: Self-pay | Admitting: Orthopedic Surgery

## 2019-05-19 ENCOUNTER — Other Ambulatory Visit: Payer: Self-pay | Admitting: Sports Medicine

## 2019-05-19 DIAGNOSIS — Z6841 Body Mass Index (BMI) 40.0 and over, adult: Secondary | ICD-10-CM | POA: Diagnosis not present

## 2019-05-19 DIAGNOSIS — M722 Plantar fascial fibromatosis: Secondary | ICD-10-CM

## 2019-05-19 DIAGNOSIS — Z981 Arthrodesis status: Secondary | ICD-10-CM | POA: Diagnosis not present

## 2019-05-19 DIAGNOSIS — M7661 Achilles tendinitis, right leg: Secondary | ICD-10-CM

## 2019-05-19 DIAGNOSIS — M5412 Radiculopathy, cervical region: Secondary | ICD-10-CM | POA: Diagnosis not present

## 2019-05-19 NOTE — Telephone Encounter (Signed)
Faxed orders to [facility] message to pt.

## 2019-05-22 ENCOUNTER — Telehealth: Payer: Self-pay

## 2019-05-22 DIAGNOSIS — M4722 Other spondylosis with radiculopathy, cervical region: Secondary | ICD-10-CM | POA: Diagnosis not present

## 2019-05-22 DIAGNOSIS — M9903 Segmental and somatic dysfunction of lumbar region: Secondary | ICD-10-CM | POA: Diagnosis not present

## 2019-05-22 DIAGNOSIS — M9902 Segmental and somatic dysfunction of thoracic region: Secondary | ICD-10-CM | POA: Diagnosis not present

## 2019-05-22 DIAGNOSIS — M4724 Other spondylosis with radiculopathy, thoracic region: Secondary | ICD-10-CM | POA: Diagnosis not present

## 2019-05-22 DIAGNOSIS — M9901 Segmental and somatic dysfunction of cervical region: Secondary | ICD-10-CM | POA: Diagnosis not present

## 2019-05-22 DIAGNOSIS — M47817 Spondylosis without myelopathy or radiculopathy, lumbosacral region: Secondary | ICD-10-CM | POA: Diagnosis not present

## 2019-05-22 NOTE — Telephone Encounter (Signed)
-----   Message from Berniece Salines, DO sent at 05/19/2019  7:40 PM EDT ----- Your monitor showed a few beats that come from the top of the heart that were also symptomatic. We can discuss options at your next visit.

## 2019-05-22 NOTE — Telephone Encounter (Signed)
Spoke with patient regarding results and recommendation.  Patient verbalizes understanding and is agreeable to plan of care. Advised patient to call back with any issues or concerns.  

## 2019-05-23 ENCOUNTER — Telehealth: Payer: Self-pay | Admitting: Nurse Practitioner

## 2019-05-23 NOTE — Telephone Encounter (Signed)
Phone call to patient to verify medication list and allergies for myelogram procedure. Pt instructed to hold maxalt and zoloft for 48hrs prior to myelogram appointment time. Pt verbalized understanding. Pre and post procedure instructions reviewed with pt.

## 2019-05-30 ENCOUNTER — Ambulatory Visit
Admission: RE | Admit: 2019-05-30 | Discharge: 2019-05-30 | Disposition: A | Discharge status: Home / Self Care | Payer: Medicare HMO | Source: Ambulatory Visit | Attending: Sports Medicine | Admitting: Sports Medicine

## 2019-05-30 DIAGNOSIS — G4733 Obstructive sleep apnea (adult) (pediatric): Secondary | ICD-10-CM | POA: Diagnosis not present

## 2019-05-30 DIAGNOSIS — M7661 Achilles tendinitis, right leg: Secondary | ICD-10-CM | POA: Diagnosis not present

## 2019-05-30 DIAGNOSIS — M722 Plantar fascial fibromatosis: Secondary | ICD-10-CM

## 2019-05-31 ENCOUNTER — Other Ambulatory Visit: Payer: Self-pay | Admitting: Cardiology

## 2019-06-01 ENCOUNTER — Encounter: Payer: Self-pay | Admitting: Sports Medicine

## 2019-06-07 ENCOUNTER — Ambulatory Visit: Payer: Medicare HMO | Admitting: Sports Medicine

## 2019-06-07 ENCOUNTER — Other Ambulatory Visit: Payer: Medicare HMO

## 2019-06-13 DIAGNOSIS — M9903 Segmental and somatic dysfunction of lumbar region: Secondary | ICD-10-CM | POA: Diagnosis not present

## 2019-06-13 DIAGNOSIS — Z9013 Acquired absence of bilateral breasts and nipples: Secondary | ICD-10-CM | POA: Diagnosis not present

## 2019-06-13 DIAGNOSIS — M4724 Other spondylosis with radiculopathy, thoracic region: Secondary | ICD-10-CM | POA: Diagnosis not present

## 2019-06-13 DIAGNOSIS — M9902 Segmental and somatic dysfunction of thoracic region: Secondary | ICD-10-CM | POA: Diagnosis not present

## 2019-06-13 DIAGNOSIS — M4722 Other spondylosis with radiculopathy, cervical region: Secondary | ICD-10-CM | POA: Diagnosis not present

## 2019-06-13 DIAGNOSIS — M47817 Spondylosis without myelopathy or radiculopathy, lumbosacral region: Secondary | ICD-10-CM | POA: Diagnosis not present

## 2019-06-13 DIAGNOSIS — M9901 Segmental and somatic dysfunction of cervical region: Secondary | ICD-10-CM | POA: Diagnosis not present

## 2019-06-14 ENCOUNTER — Ambulatory Visit
Admission: RE | Admit: 2019-06-14 | Discharge: 2019-06-14 | Disposition: A | Discharge status: Home / Self Care | Payer: Medicare HMO | Source: Ambulatory Visit | Attending: Orthopedic Surgery | Admitting: Orthopedic Surgery

## 2019-06-14 ENCOUNTER — Telehealth: Payer: Self-pay | Admitting: Sports Medicine

## 2019-06-14 ENCOUNTER — Other Ambulatory Visit: Payer: Self-pay

## 2019-06-14 ENCOUNTER — Encounter: Payer: Self-pay | Admitting: Sports Medicine

## 2019-06-14 VITALS — BP 108/60 | HR 58

## 2019-06-14 DIAGNOSIS — M5021 Other cervical disc displacement,  high cervical region: Secondary | ICD-10-CM | POA: Diagnosis not present

## 2019-06-14 DIAGNOSIS — Z981 Arthrodesis status: Secondary | ICD-10-CM

## 2019-06-14 DIAGNOSIS — M4322 Fusion of spine, cervical region: Secondary | ICD-10-CM | POA: Diagnosis not present

## 2019-06-14 DIAGNOSIS — M542 Cervicalgia: Secondary | ICD-10-CM

## 2019-06-14 MED ORDER — DIAZEPAM 5 MG PO TABS
10.0000 mg | ORAL_TABLET | Freq: Once | ORAL | Status: AC
  Administered 2019-06-14: 5 mg via ORAL

## 2019-06-14 MED ORDER — IOPAMIDOL (ISOVUE-M 300) INJECTION 61%
10.0000 mL | Freq: Once | INTRAMUSCULAR | Status: AC | PRN
  Administered 2019-06-14: 10 mL via INTRATHECAL

## 2019-06-14 MED ORDER — ONDANSETRON HCL 4 MG/2ML IJ SOLN
4.0000 mg | Freq: Once | INTRAMUSCULAR | Status: AC
  Administered 2019-06-14: 4 mg via INTRAMUSCULAR

## 2019-06-14 MED ORDER — MEPERIDINE HCL 100 MG/ML IJ SOLN
50.0000 mg | Freq: Once | INTRAMUSCULAR | Status: AC
  Administered 2019-06-14: 50 mg via INTRAMUSCULAR

## 2019-06-14 NOTE — Progress Notes (Signed)
Patient states she has been off Maxalt and Zoloft for at least the past two days.

## 2019-06-14 NOTE — Telephone Encounter (Signed)
Left message for pt that shoes are estimated to ship on 6.20.21 and I would call when they come in to schedule an appt.

## 2019-06-14 NOTE — Discharge Instructions (Signed)
Myelogram Discharge Instructions  1. Go home and rest quietly for the next 24 hours.  It is important to lie flat for the next 24 hours.  Get up only to go to the restroom.  You may lie in the bed or on a couch on your back, your stomach, your left side or your right side.  You may have one pillow under your head.  You may have pillows between your knees while you are on your side or under your knees while you are on your back.  2. DO NOT drive today.  Recline the seat as far back as it will go, while still wearing your seat belt, on the way home.  3. You may get up to go to the bathroom as needed.  You may sit up for 10 minutes to eat.  You may resume your normal diet and medications unless otherwise indicated.  Drink plenty of extra fluids today and tomorrow.  4. The incidence of a spinal headache with nausea and/or vomiting is about 5% (one in 20 patients).  If you develop a headache, lie flat and drink plenty of fluids until the headache goes away.  Caffeinated beverages may be helpful.  If you develop severe nausea and vomiting or a headache that does not go away with flat bed rest, call [phone].  5. You may resume normal activities after your 24 hours of bed rest is over; however, do not exert yourself strongly or do any heavy lifting tomorrow.  6. Call your physician for a follow-up appointment.    You may resume Maxalt and Zoloft on Thursday, June 15, 2019 after 9:30a.m.

## 2019-06-15 ENCOUNTER — Ambulatory Visit: Payer: Medicare HMO | Admitting: Sports Medicine

## 2019-06-20 DIAGNOSIS — M5030 Other cervical disc degeneration, unspecified cervical region: Secondary | ICD-10-CM | POA: Diagnosis not present

## 2019-06-22 ENCOUNTER — Ambulatory Visit (INDEPENDENT_AMBULATORY_CARE_PROVIDER_SITE_OTHER): Payer: Medicare HMO | Admitting: Sports Medicine

## 2019-06-22 ENCOUNTER — Other Ambulatory Visit: Payer: Self-pay

## 2019-06-22 ENCOUNTER — Encounter: Payer: Self-pay | Admitting: Sports Medicine

## 2019-06-22 DIAGNOSIS — E1169 Type 2 diabetes mellitus with other specified complication: Secondary | ICD-10-CM

## 2019-06-22 DIAGNOSIS — M722 Plantar fascial fibromatosis: Secondary | ICD-10-CM | POA: Diagnosis not present

## 2019-06-22 DIAGNOSIS — E669 Obesity, unspecified: Secondary | ICD-10-CM

## 2019-06-22 DIAGNOSIS — M79671 Pain in right foot: Secondary | ICD-10-CM

## 2019-06-22 DIAGNOSIS — B351 Tinea unguium: Secondary | ICD-10-CM | POA: Diagnosis not present

## 2019-06-22 DIAGNOSIS — M2550 Pain in unspecified joint: Secondary | ICD-10-CM

## 2019-06-22 DIAGNOSIS — M79676 Pain in unspecified toe(s): Secondary | ICD-10-CM | POA: Diagnosis not present
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
## 2019-06-26 DIAGNOSIS — M9901 Segmental and somatic dysfunction of cervical region: Secondary | ICD-10-CM | POA: Diagnosis not present

## 2019-06-26 DIAGNOSIS — M4722 Other spondylosis with radiculopathy, cervical region: Secondary | ICD-10-CM | POA: Diagnosis not present

## 2019-06-26 DIAGNOSIS — M9902 Segmental and somatic dysfunction of thoracic region: Secondary | ICD-10-CM | POA: Diagnosis not present

## 2019-06-26 DIAGNOSIS — M9903 Segmental and somatic dysfunction of lumbar region: Secondary | ICD-10-CM | POA: Diagnosis not present

## 2019-06-26 DIAGNOSIS — M4724 Other spondylosis with radiculopathy, thoracic region: Secondary | ICD-10-CM | POA: Diagnosis not present

## 2019-06-26 DIAGNOSIS — M47817 Spondylosis without myelopathy or radiculopathy, lumbosacral region: Secondary | ICD-10-CM | POA: Diagnosis not present

## 2019-06-27 DIAGNOSIS — G4733 Obstructive sleep apnea (adult) (pediatric): Secondary | ICD-10-CM | POA: Diagnosis not present

## 2019-06-27 DIAGNOSIS — J309 Allergic rhinitis, unspecified: Secondary | ICD-10-CM | POA: Diagnosis not present

## 2019-06-27 DIAGNOSIS — R918 Other nonspecific abnormal finding of lung field: Secondary | ICD-10-CM | POA: Diagnosis not present

## 2019-06-27 DIAGNOSIS — D869 Sarcoidosis, unspecified: Secondary | ICD-10-CM | POA: Diagnosis not present

## 2019-06-28 DIAGNOSIS — M7661 Achilles tendinitis, right leg: Secondary | ICD-10-CM | POA: Diagnosis not present

## 2019-06-28 DIAGNOSIS — M25571 Pain in right ankle and joints of right foot: Secondary | ICD-10-CM | POA: Diagnosis not present

## 2019-06-28 DIAGNOSIS — M25561 Pain in right knee: Secondary | ICD-10-CM | POA: Diagnosis not present

## 2019-06-28 DIAGNOSIS — M25552 Pain in left hip: Secondary | ICD-10-CM | POA: Diagnosis not present

## 2019-06-29 DIAGNOSIS — R42 Dizziness and giddiness: Secondary | ICD-10-CM | POA: Diagnosis not present

## 2019-06-29 DIAGNOSIS — E1149 Type 2 diabetes mellitus with other diabetic neurological complication: Secondary | ICD-10-CM | POA: Diagnosis not present

## 2019-06-29 DIAGNOSIS — E1165 Type 2 diabetes mellitus with hyperglycemia: Secondary | ICD-10-CM | POA: Diagnosis not present

## 2019-06-29 DIAGNOSIS — R109 Unspecified abdominal pain: Secondary | ICD-10-CM | POA: Diagnosis not present

## 2019-06-29 DIAGNOSIS — M545 Low back pain: Secondary | ICD-10-CM | POA: Diagnosis not present

## 2019-06-29 DIAGNOSIS — E559 Vitamin D deficiency, unspecified: Secondary | ICD-10-CM | POA: Diagnosis not present

## 2019-06-29 DIAGNOSIS — F339 Major depressive disorder, recurrent, unspecified: Secondary | ICD-10-CM | POA: Diagnosis not present

## 2019-06-29 DIAGNOSIS — M25552 Pain in left hip: Secondary | ICD-10-CM | POA: Diagnosis not present

## 2019-06-29 DIAGNOSIS — E039 Hypothyroidism, unspecified: Secondary | ICD-10-CM | POA: Diagnosis not present

## 2019-06-29 DIAGNOSIS — R110 Nausea: Secondary | ICD-10-CM | POA: Diagnosis not present

## 2019-06-29 DIAGNOSIS — Z79899 Other long term (current) drug therapy: Secondary | ICD-10-CM | POA: Diagnosis not present

## 2019-06-30 DIAGNOSIS — G4733 Obstructive sleep apnea (adult) (pediatric): Secondary | ICD-10-CM | POA: Diagnosis not present

## 2019-07-03 DIAGNOSIS — M25552 Pain in left hip: Secondary | ICD-10-CM | POA: Diagnosis not present

## 2019-07-03 DIAGNOSIS — M25571 Pain in right ankle and joints of right foot: Secondary | ICD-10-CM | POA: Diagnosis not present

## 2019-07-03 DIAGNOSIS — M7661 Achilles tendinitis, right leg: Secondary | ICD-10-CM | POA: Diagnosis not present

## 2019-07-03 DIAGNOSIS — M25561 Pain in right knee: Secondary | ICD-10-CM | POA: Diagnosis not present

## 2019-07-06 DIAGNOSIS — M7661 Achilles tendinitis, right leg: Secondary | ICD-10-CM | POA: Diagnosis not present

## 2019-07-06 DIAGNOSIS — M25552 Pain in left hip: Secondary | ICD-10-CM | POA: Diagnosis not present

## 2019-07-06 DIAGNOSIS — M25561 Pain in right knee: Secondary | ICD-10-CM | POA: Diagnosis not present

## 2019-07-06 DIAGNOSIS — M25571 Pain in right ankle and joints of right foot: Secondary | ICD-10-CM | POA: Diagnosis not present

## 2019-07-11 DIAGNOSIS — M25552 Pain in left hip: Secondary | ICD-10-CM | POA: Diagnosis not present

## 2019-07-11 DIAGNOSIS — M25561 Pain in right knee: Secondary | ICD-10-CM | POA: Diagnosis not present

## 2019-07-11 DIAGNOSIS — M25571 Pain in right ankle and joints of right foot: Secondary | ICD-10-CM | POA: Diagnosis not present

## 2019-07-11 DIAGNOSIS — M7661 Achilles tendinitis, right leg: Secondary | ICD-10-CM | POA: Diagnosis not present

## 2019-07-13 ENCOUNTER — Telehealth: Payer: Self-pay | Admitting: Sports Medicine

## 2019-07-13 DIAGNOSIS — M47817 Spondylosis without myelopathy or radiculopathy, lumbosacral region: Secondary | ICD-10-CM | POA: Diagnosis not present

## 2019-07-13 DIAGNOSIS — M4724 Other spondylosis with radiculopathy, thoracic region: Secondary | ICD-10-CM | POA: Diagnosis not present

## 2019-07-13 DIAGNOSIS — M9902 Segmental and somatic dysfunction of thoracic region: Secondary | ICD-10-CM | POA: Diagnosis not present

## 2019-07-13 DIAGNOSIS — M9903 Segmental and somatic dysfunction of lumbar region: Secondary | ICD-10-CM | POA: Diagnosis not present

## 2019-07-13 DIAGNOSIS — M9901 Segmental and somatic dysfunction of cervical region: Secondary | ICD-10-CM | POA: Diagnosis not present

## 2019-07-13 DIAGNOSIS — M4722 Other spondylosis with radiculopathy, cervical region: Secondary | ICD-10-CM | POA: Diagnosis not present

## 2019-07-13 NOTE — Telephone Encounter (Signed)
Pt left message checking on status of diabetic shoes.  Returned call and phone just rings no voicemail set up. Looks like shoes were on back order and I have message safestep for updated status.

## 2019-07-14 DIAGNOSIS — M7661 Achilles tendinitis, right leg: Secondary | ICD-10-CM | POA: Diagnosis not present

## 2019-07-14 DIAGNOSIS — M25561 Pain in right knee: Secondary | ICD-10-CM | POA: Diagnosis not present

## 2019-07-14 DIAGNOSIS — M25571 Pain in right ankle and joints of right foot: Secondary | ICD-10-CM | POA: Diagnosis not present

## 2019-07-14 DIAGNOSIS — M25552 Pain in left hip: Secondary | ICD-10-CM | POA: Diagnosis not present

## 2019-07-17 ENCOUNTER — Telehealth: Payer: Self-pay | Admitting: Sports Medicine

## 2019-07-17 DIAGNOSIS — M25571 Pain in right ankle and joints of right foot: Secondary | ICD-10-CM | POA: Diagnosis not present

## 2019-07-17 DIAGNOSIS — M25561 Pain in right knee: Secondary | ICD-10-CM | POA: Diagnosis not present

## 2019-07-17 DIAGNOSIS — I7300 Raynaud's syndrome without gangrene: Secondary | ICD-10-CM | POA: Diagnosis not present

## 2019-07-17 DIAGNOSIS — M25552 Pain in left hip: Secondary | ICD-10-CM | POA: Diagnosis not present

## 2019-07-17 DIAGNOSIS — M7661 Achilles tendinitis, right leg: Secondary | ICD-10-CM | POA: Diagnosis not present

## 2019-07-17 DIAGNOSIS — Z6841 Body Mass Index (BMI) 40.0 and over, adult: Secondary | ICD-10-CM | POA: Diagnosis not present

## 2019-07-17 NOTE — Telephone Encounter (Signed)
Pt left message checking on status of diabetic shoes..  I returned call and left message that the shoes she ordered are on back order and we may be able to change shoes to something similar but to call me to discuss.Marland Kitchen

## 2019-07-18 ENCOUNTER — Telehealth: Payer: Self-pay | Admitting: Sports Medicine

## 2019-07-18 NOTE — Telephone Encounter (Signed)
Pt returned my call and left a message stating to change the shoe to the apex X801 8.5 xw would be fine with her.  I have sent safestep a message to release the inserts and ordered the shoes. I have left a message for pt that I have done this and I will call when they come in to schedule an appt.

## 2019-07-19 DIAGNOSIS — M542 Cervicalgia: Secondary | ICD-10-CM | POA: Diagnosis not present

## 2019-07-19 DIAGNOSIS — M2550 Pain in unspecified joint: Secondary | ICD-10-CM | POA: Diagnosis not present

## 2019-07-20 DIAGNOSIS — M25571 Pain in right ankle and joints of right foot: Secondary | ICD-10-CM | POA: Diagnosis not present

## 2019-07-20 DIAGNOSIS — M7661 Achilles tendinitis, right leg: Secondary | ICD-10-CM | POA: Diagnosis not present

## 2019-07-20 DIAGNOSIS — M25561 Pain in right knee: Secondary | ICD-10-CM | POA: Diagnosis not present

## 2019-07-20 DIAGNOSIS — M25552 Pain in left hip: Secondary | ICD-10-CM | POA: Diagnosis not present

## 2019-07-23 NOTE — Progress Notes (Signed)
Cardiology Office Note:    Date:  07/25/2019   ID:  Melanie Meyers, DOB 1955/11/08, MRN [id number]  PCP:  Janine Limbo, PA-C  Cardiologist:  Shirlee More, MD    Referring MD: Janine Limbo, PA-C    ASSESSMENT:    1. Mild CAD   2. Mixed hyperlipidemia   3. Essential hypertension   4. Diabetes mellitus due to underlying condition with unspecified complications ([place])    PLAN:    In order of problems listed above:  1. Stable CAD she has had no angina on current medical treatment and continue the same including recently instituted beta-blocker aspirin and statin 2. Stable continue high intensity statin check liver function lipids today 3. Stable blood pressure in the range of lower numbers are obtained I would reduce her dose of ARB 4. Stable managed by her PCP   Next appointment: 6 months   Medication Adjustments/Labs and Tests Ordered: Current medicines are reviewed at length with the patient today.  Concerns regarding medicines are outlined above.  No orders of the defined types were placed in this encounter.  No orders of the defined types were placed in this encounter.   Chief Complaint  Patient presents with  . Follow-up  . Palpitations    History of Present Illness:    Melanie Meyers is a 64 y.o. female with a hx of mild CAD coronary angiography performed September 24, 2017 showed proximal right coronary artery 30%, proximal LAD 35% and ramus 50% also hypertension type 2 diabetes and hyper lipidemia.  She was last seen November 24, 2018.  She saw one of my partners 04/25/2019 with palpitation after was initiated on a beta-blocker.  She had a nice response or arrhythmias resolved no chest pain shortness of breath or syncope.  Unfortunately she has other problems including pain in the joints of her hands and feet was advised to see rheumatology and has had episodes of Raynaud's. Compliance with diet, lifestyle and medications: Yes  An echocardiogram  performed May 04, 2019 showing ejection fraction 55 to 60% mild concentric LVH and impaired relaxation.  Both the left and right atria are mildly enlarged and there was no other valvular abnormality.  She also had a 3-day ZIO monitor beginning April 25, 2019 showing rare ventricular and supraventricular arrhythmia and brief runs of APCs. Her triggered events were associated with supraventricular tachycardia.  Past Medical History:  Diagnosis Date  . Asthma   . B12 deficiency   . Back pain   . CAD (coronary artery disease) 11/10/2017  . Carpal tunnel syndrome of right wrist 01/15/2012  . Chronic tension-type headache, not intractable 07/10/2016  . Degenerative arthritis    degenerative arthritis of neck  . Depression   . Diabetic peripheral neuropathy ([place]) 05/17/2016  . Fibromyalgia   . Generalized anxiety disorder   . GERD (gastroesophageal reflux disease)   . Hyperlipidemia 06/20/2015  . Hypertension   . Hypothyroidism 06/02/2013  . Insomnia   . Major depressive disorder, recurrent episode with anxious distress ([place])   . Obstructive sleep apnea    On CPAP  . Sarcoidosis   . Transient alteration of awareness 10/10/2015  . Type II Diabetes Mellitus without complication ([place])   . Vitamin D deficiency     Past Surgical History:  Procedure Laterality Date  . ANTERIOR CERVICAL DECOMP/DISCECTOMY FUSION N/A 10/06/2017   Procedure: ANTERIOR CERVICAL DECOMPRESSION/DISCECTOMY FUSION C4-7;  Surgeon: Melina Schools, MD;  Location: [facility];  Service: Orthopedics;  Laterality: N/A;  4.5 hrs  .  BREAST REDUCTION SURGERY  02/18/2015  . CARPAL TUNNEL RELEASE  2014  . CARPAL TUNNEL RELEASE  04/2016  . [place]  . LEFT HEART CATH AND CORONARY ANGIOGRAPHY N/A 09/29/2017   Procedure: LEFT HEART CATH AND CORONARY ANGIOGRAPHY;  Surgeon: Martinique, Peter M, MD;  Location: [facility];  Service: Cardiovascular;  Laterality: N/A;  . NASAL POLYP SURGERY  1983  . SINOSCOPY   10/09/2014  . TONSILLECTOMY  1978  . TUBAL LIGATION      Current Medications: Current Meds  Medication Sig  . albuterol (VENTOLIN HFA) 108 (90 Base) MCG/ACT inhaler Inhale 2 puffs into the lungs every 6 (six) hours as needed for wheezing or shortness of breath.   Marland Kitchen aspirin EC 81 MG tablet Take 81 mg by mouth at bedtime.   . Azelastine HCl 137 MCG/SPRAY SOLN Place 1 spray into the nose as needed.   . Biotin 10000 MCG TABS Take 10,000 mcg by mouth at bedtime.  . budesonide-formoterol (SYMBICORT) 160-4.5 MCG/ACT inhaler Inhale 1-2 puffs into the lungs 2 (two) times daily as needed.  . cetirizine (ZYRTEC) 10 MG tablet Take 10 mg by mouth at bedtime.   Marland Kitchen co-enzyme Q-10 30 MG capsule Take 30 mg by mouth at bedtime.  . diclofenac Sodium (VOLTAREN) 1 % GEL Apply 2-3 application topically 4 (four) times daily.  Marland Kitchen esomeprazole (NEXIUM) 20 MG capsule Take 20 mg by mouth daily at 12 noon.  . Insulin Glargine-Lixisenatide (SOLIQUA) 100-33 UNT-MCG/ML SOPN Inject 26 Units into the skin in the morning. 30 min before breakfast  . levothyroxine (SYNTHROID, LEVOTHROID) 25 MCG tablet Take 25 mcg by mouth daily before breakfast.  . lidocaine (LIDODERM) 5 % Place 1 patch onto the skin as needed.   Marland Kitchen LINZESS 145 MCG CAPS capsule 145 mcg as needed.  . meclizine (ANTIVERT) 12.5 MG tablet Take 1-2 tablets by mouth every 4 (four) hours as needed.  . Menthol, Topical Analgesic, (BIOFREEZE EX) Apply 1 application topically daily as needed (muscle pain).  . metFORMIN (GLUCOPHAGE) 1000 MG tablet Take 1 tablet (1,000 mg total) by mouth 2 (two) times daily with a meal.  . methocarbamol (ROBAXIN) 500 MG tablet Take 1 tablet (500 mg total) by mouth 3 (three) times daily.  . metoprolol succinate (TOPROL-XL) 25 MG 24 hr tablet TAKE 1 TABLET BY MOUTH EVERY DAY  . montelukast (SINGULAIR) 10 MG tablet Take 10 mg by mouth at bedtime.   . Multiple Vitamin (MULTIVITAMIN WITH MINERALS) TABS tablet Take 1 tablet by mouth at bedtime.   . naproxen (NAPROSYN) 500 MG tablet Take 500 mg by mouth as needed.   . nitroGLYCERIN (NITROSTAT) 0.4 MG SL tablet Place 1 tablet (0.4 mg total) under the tongue every 5 (five) minutes as needed.  Marland Kitchen olmesartan (BENICAR) 20 MG tablet TAKE 1/2 TABLET BY MOUTH EVERY DAY  . Omega-3 Fatty Acids (FISH OIL) 1000 MG CAPS Take 2,000 mg by mouth at bedtime.  . polyethylene glycol (MIRALAX / GLYCOLAX) packet Take 17 g by mouth daily as needed for mild constipation.   . pravastatin (PRAVACHOL) 40 MG tablet Take 40 mg by mouth daily.   . pregabalin (LYRICA) 150 MG capsule Take 1 cap in the morning and 2 caps in the evening  . promethazine (PHENERGAN) 12.5 MG tablet Take 1-2 tablets by mouth every 4 (four) hours as needed.  . rizatriptan (MAXALT) 10 MG tablet Take 10 mg by mouth as needed for migraine.   Marland Kitchen zolpidem (AMBIEN) 10  MG tablet      Allergies:   Accupril [quinapril hcl], Prednisone, and Levaquin [levofloxacin]   Social History   Socioeconomic History  . Marital status: Widowed    Spouse name: Not on file  . Number of children: Not on file  . Years of education: 31  . Highest education level: High school graduate  Occupational History  . Not on file  Tobacco Use  . Smoking status: Never Smoker  . Smokeless tobacco: Never Used  Vaping Use  . Vaping Use: Never used  Substance and Sexual Activity  . Alcohol use: No    Alcohol/week: 0.0 standard drinks  . Drug use: No  . Sexual activity: Not on file  Other Topics Concern  . Not on file  Social History Narrative  . Not on file   Social Determinants of Health   Financial Resource Strain:   . Difficulty of Paying Living Expenses:   Food Insecurity:   . Worried About Charity fundraiser in the Last Year:   . Arboriculturist in the Last Year:   Transportation Needs:   . Film/video editor (Medical):   Marland Kitchen Lack of Transportation (Non-Medical):   Physical Activity:   . Days of Exercise per Week:   . Minutes of Exercise per  Session:   Stress:   . Feeling of Stress :   Social Connections:   . Frequency of Communication with Friends and Family:   . Frequency of Social Gatherings with Friends and Family:   . Attends Religious Services:   . Active Member of Clubs or Organizations:   . Attends Archivist Meetings:   Marland Kitchen Marital Status:      Family History: The patient's family history includes Alzheimer's disease in her mother; Diabetes in her mother; Gout in her mother; Heart attack in her father and sister; High Cholesterol in her father; High blood pressure in her father; Parkinson's disease in her mother; Thyroid disease in her sister, sister, and sister. ROS:   Please see the history of present illness.    All other systems reviewed and are negative.  EKGs/Labs/Other Studies Reviewed:    The following studies were reviewed today:    Recent Labs: 08/24/2018: ALT 10; BUN 20; Creatinine, Ser 1.12; Potassium 4.6; Sodium 140  Recent Lipid Panel    Component Value Date/Time   CHOL 181 10/18/2018 0822   TRIG 250 (H) 10/18/2018 0822   HDL 55 10/18/2018 0822   CHOLHDL 3.3 10/18/2018 0822   LDLCALC 85 10/18/2018 0822    Physical Exam:    VS:  BP (!) 96/54   Pulse 68   Ht 5\' 1"  (1.549 m)   Wt 224 lb (101.6 kg)   LMP  (LMP Unknown) Comment: tubal ligation  SpO2 95%   BMI 42.32 kg/m     Wt Readings from Last 3 Encounters:  07/25/19 224 lb (101.6 kg)  04/25/19 231 lb (104.8 kg)  11/24/18 228 lb (103.4 kg)    Repeat blood pressure by me 102/60 GEN:  Well nourished, well developed in no acute distress HEENT: Normal NECK: No JVD; No carotid bruits LYMPHATICS: No lymphadenopathy CARDIAC: RRR, no murmurs, rubs, gallops RESPIRATORY:  Clear to auscultation without rales, wheezing or rhonchi  ABDOMEN: Soft, non-tender, non-distended MUSCULOSKELETAL:  No edema; No deformity  SKIN: Warm and dry NEUROLOGIC:  Alert and oriented x 3 PSYCHIATRIC:  Normal affect    Signed, Shirlee More, MD   07/25/2019 1:34 PM    [facility]

## 2019-07-24 DIAGNOSIS — M7661 Achilles tendinitis, right leg: Secondary | ICD-10-CM | POA: Diagnosis not present

## 2019-07-24 DIAGNOSIS — M25552 Pain in left hip: Secondary | ICD-10-CM | POA: Diagnosis not present

## 2019-07-24 DIAGNOSIS — M25571 Pain in right ankle and joints of right foot: Secondary | ICD-10-CM | POA: Diagnosis not present

## 2019-07-24 DIAGNOSIS — M25561 Pain in right knee: Secondary | ICD-10-CM | POA: Diagnosis not present

## 2019-07-25 ENCOUNTER — Other Ambulatory Visit: Payer: Self-pay

## 2019-07-25 ENCOUNTER — Other Ambulatory Visit: Payer: Self-pay | Admitting: Cardiology

## 2019-07-25 ENCOUNTER — Encounter: Payer: Self-pay | Admitting: Cardiology

## 2019-07-25 ENCOUNTER — Ambulatory Visit (INDEPENDENT_AMBULATORY_CARE_PROVIDER_SITE_OTHER): Payer: Medicare HMO | Admitting: Cardiology

## 2019-07-25 VITALS — BP 96/54 | HR 68 | Ht 61.0 in | Wt 224.0 lb

## 2019-07-25 DIAGNOSIS — I10 Essential (primary) hypertension: Secondary | ICD-10-CM

## 2019-07-25 DIAGNOSIS — E088 Diabetes mellitus due to underlying condition with unspecified complications: Secondary | ICD-10-CM | POA: Diagnosis not present

## 2019-07-25 DIAGNOSIS — E782 Mixed hyperlipidemia: Secondary | ICD-10-CM

## 2019-07-25 DIAGNOSIS — I2510 Atherosclerotic heart disease of native coronary artery without angina pectoris: Secondary | ICD-10-CM

## 2019-07-25 DIAGNOSIS — K5904 Chronic idiopathic constipation: Secondary | ICD-10-CM | POA: Diagnosis not present

## 2019-07-25 DIAGNOSIS — R1011 Right upper quadrant pain: Secondary | ICD-10-CM | POA: Diagnosis not present

## 2019-07-25 NOTE — Patient Instructions (Signed)

## 2019-07-26 DIAGNOSIS — R1011 Right upper quadrant pain: Secondary | ICD-10-CM | POA: Diagnosis not present

## 2019-07-26 DIAGNOSIS — K760 Fatty (change of) liver, not elsewhere classified: Secondary | ICD-10-CM | POA: Diagnosis not present

## 2019-07-26 DIAGNOSIS — R160 Hepatomegaly, not elsewhere classified: Secondary | ICD-10-CM | POA: Diagnosis not present

## 2019-07-26 LAB — COMPREHENSIVE METABOLIC PANEL
ALT: 13 IU/L (ref 0–32)
AST: 18 IU/L (ref 0–40)
Albumin/Globulin Ratio: 1.4 (ref 1.2–2.2)
Albumin: 4.1 g/dL (ref 3.8–4.8)
Alkaline Phosphatase: 77 IU/L (ref 48–121)
BUN/Creatinine Ratio: 24 (ref 12–28)
BUN: 23 mg/dL (ref 8–27)
Bilirubin Total: <0.2 mg/dL (ref 0.0–1.2)
CO2: 20 mmol/L (ref 20–29)
Calcium: 9.1 mg/dL (ref 8.7–10.3)
Chloride: 106 mmol/L (ref 96–106)
Creatinine, Ser: 0.97 mg/dL (ref 0.57–1.00)
GFR calc Af Amer: 72 mL/min/{1.73_m2} (ref >59)
GFR calc non Af Amer: 62 mL/min/{1.73_m2} (ref >59)
Globulin, Total: 2.9 g/dL (ref 1.5–4.5)
Glucose: 103 mg/dL — ABNORMAL HIGH (ref 65–99)
Potassium: 4.5 mmol/L (ref 3.5–5.2)
Sodium: 139 mmol/L (ref 134–144)
Total Protein: 7 g/dL (ref 6.0–8.5)

## 2019-07-26 LAB — LIPID PANEL
Chol/HDL Ratio: 4.3 ratio (ref 0.0–4.4)
Cholesterol, Total: 222 mg/dL — ABNORMAL HIGH (ref 100–199)
HDL: 52 mg/dL (ref >39)
LDL Chol Calc (NIH): 120 mg/dL — ABNORMAL HIGH (ref 0–99)
Triglycerides: 284 mg/dL — ABNORMAL HIGH (ref 0–149)
VLDL Cholesterol Cal: 50 mg/dL — ABNORMAL HIGH (ref 5–40)

## 2019-07-27 DIAGNOSIS — M25571 Pain in right ankle and joints of right foot: Secondary | ICD-10-CM | POA: Diagnosis not present

## 2019-07-27 DIAGNOSIS — M25561 Pain in right knee: Secondary | ICD-10-CM | POA: Diagnosis not present

## 2019-07-27 DIAGNOSIS — M25552 Pain in left hip: Secondary | ICD-10-CM | POA: Diagnosis not present

## 2019-07-27 DIAGNOSIS — M7661 Achilles tendinitis, right leg: Secondary | ICD-10-CM | POA: Diagnosis not present

## 2019-07-28 DIAGNOSIS — M9903 Segmental and somatic dysfunction of lumbar region: Secondary | ICD-10-CM | POA: Diagnosis not present

## 2019-07-28 DIAGNOSIS — M4724 Other spondylosis with radiculopathy, thoracic region: Secondary | ICD-10-CM | POA: Diagnosis not present

## 2019-07-28 DIAGNOSIS — M9902 Segmental and somatic dysfunction of thoracic region: Secondary | ICD-10-CM | POA: Diagnosis not present

## 2019-07-28 DIAGNOSIS — M9901 Segmental and somatic dysfunction of cervical region: Secondary | ICD-10-CM | POA: Diagnosis not present

## 2019-07-28 DIAGNOSIS — M4722 Other spondylosis with radiculopathy, cervical region: Secondary | ICD-10-CM | POA: Diagnosis not present

## 2019-07-28 DIAGNOSIS — M47817 Spondylosis without myelopathy or radiculopathy, lumbosacral region: Secondary | ICD-10-CM | POA: Diagnosis not present

## 2019-07-30 DIAGNOSIS — G4733 Obstructive sleep apnea (adult) (pediatric): Secondary | ICD-10-CM | POA: Diagnosis not present

## 2019-07-31 DIAGNOSIS — M7661 Achilles tendinitis, right leg: Secondary | ICD-10-CM | POA: Diagnosis not present

## 2019-07-31 DIAGNOSIS — M25561 Pain in right knee: Secondary | ICD-10-CM | POA: Diagnosis not present

## 2019-07-31 DIAGNOSIS — M25552 Pain in left hip: Secondary | ICD-10-CM | POA: Diagnosis not present

## 2019-07-31 DIAGNOSIS — M25571 Pain in right ankle and joints of right foot: Secondary | ICD-10-CM | POA: Diagnosis not present

## 2019-08-01 ENCOUNTER — Telehealth: Payer: Self-pay

## 2019-08-01 DIAGNOSIS — G4733 Obstructive sleep apnea (adult) (pediatric): Secondary | ICD-10-CM | POA: Diagnosis not present

## 2019-08-01 DIAGNOSIS — E782 Mixed hyperlipidemia: Secondary | ICD-10-CM

## 2019-08-01 NOTE — Telephone Encounter (Signed)
-----   Message from Richardo Priest, MD sent at 08/01/2019  9:33 AM EDT ----- Her lipids are quite high.  Please confirm she is taking pravastatin  If she is add Zetia 10 mg daily repeat lipid profile in 6 weeks

## 2019-08-01 NOTE — Telephone Encounter (Signed)
Patient is returning call.  °

## 2019-08-01 NOTE — Telephone Encounter (Signed)
Left message on patients voicemail to please return our call.   

## 2019-08-01 NOTE — Telephone Encounter (Signed)
Spoke with the patient just now and she let me know that she had stopped taking her Pravastatin for several weeks before this lab. She states that she has started taking it again and will come back for follow up labs in 6 weeks. No other issues or concerns were noted at this time.    Encouraged patient to call back with any questions or concerns.

## 2019-08-09 DIAGNOSIS — M25552 Pain in left hip: Secondary | ICD-10-CM | POA: Diagnosis not present

## 2019-08-09 DIAGNOSIS — M7661 Achilles tendinitis, right leg: Secondary | ICD-10-CM | POA: Diagnosis not present

## 2019-08-09 DIAGNOSIS — M25561 Pain in right knee: Secondary | ICD-10-CM | POA: Diagnosis not present

## 2019-08-09 DIAGNOSIS — M25571 Pain in right ankle and joints of right foot: Secondary | ICD-10-CM | POA: Diagnosis not present

## 2019-08-17 ENCOUNTER — Telehealth: Payer: Self-pay | Admitting: Sports Medicine

## 2019-08-17 DIAGNOSIS — M7661 Achilles tendinitis, right leg: Secondary | ICD-10-CM | POA: Diagnosis not present

## 2019-08-17 DIAGNOSIS — M25552 Pain in left hip: Secondary | ICD-10-CM | POA: Diagnosis not present

## 2019-08-17 DIAGNOSIS — M25571 Pain in right ankle and joints of right foot: Secondary | ICD-10-CM | POA: Diagnosis not present

## 2019-08-17 DIAGNOSIS — M25561 Pain in right knee: Secondary | ICD-10-CM | POA: Diagnosis not present

## 2019-08-17 NOTE — Telephone Encounter (Signed)
Pt left message for me yesterday checking on status of diabetic shoes.  I returned call and explained we got the shoes but now the authorization has expired and I have refaxed it to the pcp to get an updated signature.I did apologize to the pt for the delay.

## 2019-08-23 ENCOUNTER — Other Ambulatory Visit: Payer: Self-pay | Admitting: Cardiology

## 2019-08-28 DIAGNOSIS — M47817 Spondylosis without myelopathy or radiculopathy, lumbosacral region: Secondary | ICD-10-CM | POA: Diagnosis not present

## 2019-08-28 DIAGNOSIS — M9901 Segmental and somatic dysfunction of cervical region: Secondary | ICD-10-CM | POA: Diagnosis not present

## 2019-08-28 DIAGNOSIS — M4724 Other spondylosis with radiculopathy, thoracic region: Secondary | ICD-10-CM | POA: Diagnosis not present

## 2019-08-28 DIAGNOSIS — M9902 Segmental and somatic dysfunction of thoracic region: Secondary | ICD-10-CM | POA: Diagnosis not present

## 2019-08-28 DIAGNOSIS — M4722 Other spondylosis with radiculopathy, cervical region: Secondary | ICD-10-CM | POA: Diagnosis not present

## 2019-08-28 DIAGNOSIS — M9903 Segmental and somatic dysfunction of lumbar region: Secondary | ICD-10-CM | POA: Diagnosis not present

## 2019-08-30 DIAGNOSIS — M25571 Pain in right ankle and joints of right foot: Secondary | ICD-10-CM | POA: Diagnosis not present

## 2019-08-30 DIAGNOSIS — M7661 Achilles tendinitis, right leg: Secondary | ICD-10-CM | POA: Diagnosis not present

## 2019-08-30 DIAGNOSIS — M25561 Pain in right knee: Secondary | ICD-10-CM | POA: Diagnosis not present

## 2019-08-30 DIAGNOSIS — G4733 Obstructive sleep apnea (adult) (pediatric): Secondary | ICD-10-CM | POA: Diagnosis not present

## 2019-08-30 DIAGNOSIS — M25552 Pain in left hip: Secondary | ICD-10-CM | POA: Diagnosis not present

## 2019-09-01 ENCOUNTER — Other Ambulatory Visit: Payer: Self-pay

## 2019-09-01 ENCOUNTER — Ambulatory Visit (INDEPENDENT_AMBULATORY_CARE_PROVIDER_SITE_OTHER): Payer: Medicare HMO | Admitting: Sports Medicine

## 2019-09-01 ENCOUNTER — Encounter: Payer: Self-pay | Admitting: Sports Medicine

## 2019-09-01 DIAGNOSIS — M7661 Achilles tendinitis, right leg: Secondary | ICD-10-CM

## 2019-09-01 DIAGNOSIS — E1169 Type 2 diabetes mellitus with other specified complication: Secondary | ICD-10-CM | POA: Diagnosis not present

## 2019-09-01 DIAGNOSIS — M2142 Flat foot [pes planus] (acquired), left foot: Secondary | ICD-10-CM

## 2019-09-01 DIAGNOSIS — M722 Plantar fascial fibromatosis: Secondary | ICD-10-CM | POA: Diagnosis not present

## 2019-09-01 DIAGNOSIS — B351 Tinea unguium: Secondary | ICD-10-CM

## 2019-09-01 DIAGNOSIS — M79676 Pain in unspecified toe(s): Secondary | ICD-10-CM

## 2019-09-01 DIAGNOSIS — M2550 Pain in unspecified joint: Secondary | ICD-10-CM

## 2019-09-01 DIAGNOSIS — M2141 Flat foot [pes planus] (acquired), right foot: Secondary | ICD-10-CM

## 2019-09-01 DIAGNOSIS — E669 Obesity, unspecified: Secondary | ICD-10-CM

## 2019-09-01 NOTE — Progress Notes (Signed)
Subjective: Melanie Meyers is a 64 y.o. female patient with history of diabetes who presents to office today complaining of pain to toenails unable to trim and continue chronic pain to her right heel.  Patient is also here for pickup of diabetic shoes.  Patient denies any other pedal complaints at this time.  Fasting blood sugar this morning 116 A1c 6.1 PCP [place] couple of months ago  Patient Active Problem List   Diagnosis Date Noted  . Acute tear of lateral meniscus of right knee with peripheral detachment 10/13/2018  . Pain in right knee 09/21/2018  . Major depressive disorder, recurrent episode with anxious distress ([place])   . Acquired hallux rigidus of right foot 04/13/2018  . Osteoarthritis of knee 03/28/2018  . Pes anserinus bursitis of right knee 03/28/2018  . CAD (coronary artery disease) 11/10/2017  . Osteoarthritis of finger of right hand 02/10/2017  . Chronic tension-type headache, not intractable 07/10/2016  . Diabetic peripheral neuropathy ([place]) 05/17/2016  . HTN (hypertension) 06/20/2015  . Hyperlipidemia 06/20/2015  . Morbid obesity ([place]) 06/20/2015  . Metatarsal deformity 05/30/2014  . Metatarsalgia 05/30/2014  . Hand paresthesia 06/27/2013  . Hypothyroidism 06/02/2013  . Type 2 diabetes mellitus ([place]) 06/02/2013  . 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 Nerve root pain 01/27/2013  . Narrowing of intervertebral disc space 01/27/2013  . Myofascial pain 01/27/2013  . Cervical spine pain 11/24/2012  . Cervical osteoarthritis 11/24/2012  . Carpal tunnel syndrome 01/15/2012  . Asthma 01/20/2008   Current Outpatient Medications on File Prior to Visit  Medication Sig Dispense Refill  . albuterol (VENTOLIN HFA) 108 (90 Base) MCG/ACT inhaler Inhale 2 puffs into the lungs every 6 (six) hours as needed for wheezing or shortness of breath.     Marland Kitchen aspirin EC 81 MG tablet Take 81 mg by mouth at bedtime.     . Azelastine HCl 137  MCG/SPRAY SOLN Place 1 spray into the nose as needed.     . Biotin 10000 MCG TABS Take 10,000 mcg by mouth at bedtime.    . budesonide-formoterol (SYMBICORT) 160-4.5 MCG/ACT inhaler Inhale 1-2 puffs into the lungs 2 (two) times daily as needed.    . cetirizine (ZYRTEC) 10 MG tablet Take 10 mg by mouth at bedtime.     Marland Kitchen co-enzyme Q-10 30 MG capsule Take 30 mg by mouth at bedtime.    . diclofenac Sodium (VOLTAREN) 1 % GEL Apply 2-3 application topically 4 (four) times daily.    Marland Kitchen esomeprazole (NEXIUM) 20 MG capsule Take 20 mg by mouth daily at 12 noon.    . Insulin Glargine-Lixisenatide (SOLIQUA) 100-33 UNT-MCG/ML SOPN Inject 26 Units into the skin in the morning. 30 min before breakfast    . levothyroxine (SYNTHROID, LEVOTHROID) 25 MCG tablet Take 25 mcg by mouth daily before breakfast.    . lidocaine (LIDODERM) 5 % Place 1 patch onto the skin as needed.     Marland Kitchen LINZESS 145 MCG CAPS capsule 145 mcg as needed.    . meclizine (ANTIVERT) 12.5 MG tablet Take 1-2 tablets by mouth every 4 (four) hours as needed.    . Menthol, Topical Analgesic, (BIOFREEZE EX) Apply 1 application topically daily as needed (muscle pain).    . metFORMIN (GLUCOPHAGE) 1000 MG tablet Take 1 tablet (1,000 mg total) by mouth 2 (two) times daily with a meal.    . methocarbamol (ROBAXIN) 500 MG tablet Take 1 tablet (500 mg total) by mouth 3 (three) times daily.  30 tablet 0  . metoprolol succinate (TOPROL-XL) 25 MG 24 hr tablet TAKE 1 TABLET BY MOUTH EVERY DAY 90 tablet 2  . montelukast (SINGULAIR) 10 MG tablet Take 10 mg by mouth at bedtime.     . Multiple Vitamin (MULTIVITAMIN WITH MINERALS) TABS tablet Take 1 tablet by mouth at bedtime.    . naproxen (NAPROSYN) 500 MG tablet Take 500 mg by mouth as needed.     . nitroGLYCERIN (NITROSTAT) 0.4 MG SL tablet Place 1 tablet (0.4 mg total) under the tongue every 5 (five) minutes as needed. 25 tablet 11  . olmesartan (BENICAR) 20 MG tablet TAKE 1/2 TABLET BY MOUTH EVERY DAY 45 tablet 1   . Omega-3 Fatty Acids (FISH OIL) 1000 MG CAPS Take 2,000 mg by mouth at bedtime.    . polyethylene glycol (MIRALAX / GLYCOLAX) packet Take 17 g by mouth daily as needed for mild constipation.     . potassium gluconate (HM POTASSIUM) 595 (99 K) MG TABS tablet Take 595 mg by mouth daily as needed (cramps). (Patient not taking: Reported on 07/25/2019)    . pravastatin (PRAVACHOL) 40 MG tablet Take 40 mg by mouth daily.     . pregabalin (LYRICA) 150 MG capsule Take 1 cap in the morning and 2 caps in the evening 270 capsule 3  . promethazine (PHENERGAN) 12.5 MG tablet Take 1-2 tablets by mouth every 4 (four) hours as needed.    . rizatriptan (MAXALT) 10 MG tablet Take 10 mg by mouth as needed for migraine.     Marland Kitchen zolpidem (AMBIEN) 10 MG tablet      No current facility-administered medications on file prior to visit.   Allergies  Allergen Reactions  . Accupril [Quinapril Hcl] Cough  . Prednisone Other (See Comments)    Has to be cautious due to Blood Sugar  . Levaquin [Levofloxacin] Rash    Recent Results (from the past 2160 hour(s))  Comprehensive metabolic panel     Status: Abnormal   Collection Time: 07/25/19  1:43 PM  Result Value Ref Range   Glucose 103 (H) 65 - 99 mg/dL   BUN 23 8 - 27 mg/dL   Creatinine, Ser 0.97 0.57 - 1.00 mg/dL   GFR calc non Af Amer 62 >59 mL/min/1.73   GFR calc Af Amer 72 >59 mL/min/1.73    Comment: **[facility] currently reports eGFR in compliance with the current**   recommendations of the [employer]. [facility] will   update reporting as new guidelines are published from the NKF-ASN   Task force.    BUN/Creatinine Ratio 24 12 - 28   Sodium 139 134 - 144 mmol/L   Potassium 4.5 3.5 - 5.2 mmol/L   Chloride 106 96 - 106 mmol/L   CO2 20 20 - 29 mmol/L   Calcium 9.1 8.7 - 10.3 mg/dL   Total Protein 7.0 6.0 - 8.5 g/dL   Albumin 4.1 3.8 - 4.8 g/dL   Globulin, Total 2.9 1.5 - 4.5 g/dL   Albumin/Globulin Ratio 1.4 1.2 - 2.2   Bilirubin Total <0.2  0.0 - 1.2 mg/dL   Alkaline Phosphatase 77 48 - 121 IU/L   AST 18 0 - 40 IU/L   ALT 13 0 - 32 IU/L  Lipid panel     Status: Abnormal   Collection Time: 07/25/19  1:43 PM  Result Value Ref Range   Cholesterol, Total 222 (H) 100 - 199 mg/dL   Triglycerides 284 (H) 0 - 149 mg/dL   HDL  52 >39 mg/dL   VLDL Cholesterol Cal 50 (H) 5 - 40 mg/dL   LDL Chol Calc (NIH) 120 (H) 0 - 99 mg/dL   Chol/HDL Ratio 4.3 0.0 - 4.4 ratio    Comment:                                   T. Chol/HDL Ratio                                             Men  Women                               1/2 Avg.Risk  3.4    3.3                                   Avg.Risk  5.0    4.4                                2X Avg.Risk  9.6    7.1                                3X Avg.Risk 23.4   11.0     Objective: General: Patient is awake, alert, and oriented x 3 and in no acute distress.  Integument: Skin is warm, dry and supple bilateral. Nails are mildly elongated thickened and dystrophic with subungual debris, consistent with onychomycosis, 1-5 bilateral. No signs of infection. No open lesions or preulcerative lesions present bilateral. Remaining integument unremarkable.  Vasculature:  Dorsalis Pedis pulse 1/4 bilateral. Posterior Tibial pulse  1/4 bilateral. Capillary fill time <3 sec 1-5 bilateral. Positive hair growth to the level of the digits.Temperature gradient within normal limits. No varicosities present bilateral. No edema present bilateral.   Neurology: Subjective sharp pains to the right heel.  Otherwise gross sensation present via light touch bilateral.  Musculoskeletal: There is tenderness palpation of the plantar fascial insertion of the right foot greater than the achilles tendon on right unchanged from prior and appears to be chronic at this time.  There is asymptomatic bunion and pes planus foot type noted bilateral.  Muscular strength 5/5 in all lower extremity muscular groups bilateral without pain on range of  motion except with mild guarding on the right foot due to pain at plantar fascia like before. No tenderness with calf compression bilateral.  Assessment and Plan: Problem List Items Addressed This Visit    None    Visit Diagnoses    Pain due to onychomycosis of nail    -  Primary   Diabetes mellitus type 2 in obese (HCC)       Plantar fasciitis, right       Arthralgia, unspecified joint       Achilles tendinitis of right lower extremity       Pes planus of both feet          -Examined patient. -Mechanically debrided nails x10 using a sterile nail nipper without incident -Re-discussed with patient heel pain and achilles tendonitis  -Recommend patient to  continue with stretching exercises as previously taught by physical therapy -Advised patient to try her new diabetic shoes that were dispensed at today's visit patient was able to ambulate there was of note some heel slippage so we alternately laced her shoes to see if this will improve it patient states that she wants to take her shoes home to try them I also advised patient that the new insoles in her shoes may be also beneficial for her heel pain -Advised patient if pain continues may benefit from icing -Return to office in 3 months for routine foot care or sooner if problems or issues arise -Patient advised to call the office if any problems or questions arise in the meantime.  Landis Martins, DPM

## 2019-09-19 ENCOUNTER — Telehealth: Payer: Self-pay | Admitting: Physician Assistant

## 2019-09-19 NOTE — Telephone Encounter (Signed)
Called to discuss with Melanie Meyers about Covid symptoms and the use of casirivimab and imdevimab, a monoclonal antibody infusion for those with mild to moderate Covid symptoms and at a high risk of hospitalization.     Patient exposed to daughter who has +COVID. Patient has covid symptoms since Saturday 9/11 but hasn't tested yet. Hx of CAD, DM and HTN.   She is interested in [facility], will forward information to sq clinic.   Patient Active Problem List   Diagnosis Date Noted  . Acute tear of lateral meniscus of right knee with peripheral detachment 10/13/2018  . Pain in right knee 09/21/2018  . Major depressive disorder, recurrent episode with anxious distress ([place])   . Acquired hallux rigidus of right foot 04/13/2018  . Osteoarthritis of knee 03/28/2018  . Pes anserinus bursitis of right knee 03/28/2018  . CAD (coronary artery disease) 11/10/2017  . Osteoarthritis of finger of right hand 02/10/2017  . Chronic tension-type headache, not intractable 07/10/2016  . Diabetic peripheral neuropathy ([place]) 05/17/2016  . HTN (hypertension) 06/20/2015  . Hyperlipidemia 06/20/2015  . Morbid obesity ([place]) 06/20/2015  . Metatarsal deformity 05/30/2014  . Metatarsalgia 05/30/2014  . Hand paresthesia 06/27/2013  . Hypothyroidism 06/02/2013  . Type 2 diabetes mellitus ([place]) 06/02/2013  . 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 Nerve root pain 01/27/2013  . Narrowing of intervertebral disc space 01/27/2013  . Myofascial pain 01/27/2013  . Cervical spine pain 11/24/2012  . Cervical osteoarthritis 11/24/2012  . Carpal tunnel syndrome 01/15/2012  . Asthma 01/20/2008     Leanor Kail, PA - C

## 2019-09-20 ENCOUNTER — Telehealth: Payer: Self-pay | Admitting: Sports Medicine

## 2019-09-20 NOTE — Telephone Encounter (Signed)
Pt left message wanting a call back to discuss diabetic shoes.  I returned call and left message for pt to call me back.

## 2019-09-21 ENCOUNTER — Telehealth: Payer: Self-pay | Admitting: Sports Medicine

## 2019-09-21 NOTE — Telephone Encounter (Signed)
Pt left message returning the call from yesterday about diabetic shoes.  I returned call and left message on home and cell number for pt to call me back.

## 2019-09-28 DIAGNOSIS — R1013 Epigastric pain: Secondary | ICD-10-CM | POA: Diagnosis not present

## 2019-09-28 DIAGNOSIS — K5904 Chronic idiopathic constipation: Secondary | ICD-10-CM | POA: Diagnosis not present

## 2019-09-29 ENCOUNTER — Other Ambulatory Visit: Payer: Self-pay

## 2019-09-29 ENCOUNTER — Ambulatory Visit: Payer: Medicare HMO | Admitting: Orthotics

## 2019-09-29 DIAGNOSIS — E669 Obesity, unspecified: Secondary | ICD-10-CM

## 2019-09-29 NOTE — Progress Notes (Signed)
Reorder shoes 1/2 size smaller.  Dr. Cannon Kettle or Horris Latino to take to [place] for patient to p/ui

## 2019-09-30 DIAGNOSIS — G4733 Obstructive sleep apnea (adult) (pediatric): Secondary | ICD-10-CM | POA: Diagnosis not present

## 2019-10-02 DIAGNOSIS — M47817 Spondylosis without myelopathy or radiculopathy, lumbosacral region: Secondary | ICD-10-CM | POA: Diagnosis not present

## 2019-10-02 DIAGNOSIS — M9902 Segmental and somatic dysfunction of thoracic region: Secondary | ICD-10-CM | POA: Diagnosis not present

## 2019-10-02 DIAGNOSIS — M4724 Other spondylosis with radiculopathy, thoracic region: Secondary | ICD-10-CM | POA: Diagnosis not present

## 2019-10-02 DIAGNOSIS — M9903 Segmental and somatic dysfunction of lumbar region: Secondary | ICD-10-CM | POA: Diagnosis not present

## 2019-10-02 DIAGNOSIS — M9901 Segmental and somatic dysfunction of cervical region: Secondary | ICD-10-CM | POA: Diagnosis not present

## 2019-10-02 DIAGNOSIS — M4722 Other spondylosis with radiculopathy, cervical region: Secondary | ICD-10-CM | POA: Diagnosis not present

## 2019-10-03 DIAGNOSIS — D869 Sarcoidosis, unspecified: Secondary | ICD-10-CM | POA: Diagnosis not present

## 2019-10-03 DIAGNOSIS — G4733 Obstructive sleep apnea (adult) (pediatric): Secondary | ICD-10-CM | POA: Diagnosis not present

## 2019-10-03 DIAGNOSIS — R918 Other nonspecific abnormal finding of lung field: Secondary | ICD-10-CM | POA: Diagnosis not present

## 2019-10-03 DIAGNOSIS — J309 Allergic rhinitis, unspecified: Secondary | ICD-10-CM | POA: Diagnosis not present

## 2019-10-05 DIAGNOSIS — M7661 Achilles tendinitis, right leg: Secondary | ICD-10-CM | POA: Diagnosis not present

## 2019-10-05 DIAGNOSIS — M25561 Pain in right knee: Secondary | ICD-10-CM | POA: Diagnosis not present

## 2019-10-05 DIAGNOSIS — M25571 Pain in right ankle and joints of right foot: Secondary | ICD-10-CM | POA: Diagnosis not present

## 2019-10-05 DIAGNOSIS — M25552 Pain in left hip: Secondary | ICD-10-CM | POA: Diagnosis not present

## 2019-10-09 DIAGNOSIS — H524 Presbyopia: Secondary | ICD-10-CM | POA: Diagnosis not present

## 2019-10-09 DIAGNOSIS — M25561 Pain in right knee: Secondary | ICD-10-CM | POA: Diagnosis not present

## 2019-10-09 DIAGNOSIS — M25552 Pain in left hip: Secondary | ICD-10-CM | POA: Diagnosis not present

## 2019-10-09 DIAGNOSIS — H5203 Hypermetropia, bilateral: Secondary | ICD-10-CM | POA: Diagnosis not present

## 2019-10-09 DIAGNOSIS — H52223 Regular astigmatism, bilateral: Secondary | ICD-10-CM | POA: Diagnosis not present

## 2019-10-09 DIAGNOSIS — M7661 Achilles tendinitis, right leg: Secondary | ICD-10-CM | POA: Diagnosis not present

## 2019-10-09 DIAGNOSIS — E119 Type 2 diabetes mellitus without complications: Secondary | ICD-10-CM | POA: Diagnosis not present

## 2019-10-09 DIAGNOSIS — H25813 Combined forms of age-related cataract, bilateral: Secondary | ICD-10-CM | POA: Diagnosis not present

## 2019-10-09 DIAGNOSIS — Z794 Long term (current) use of insulin: Secondary | ICD-10-CM | POA: Diagnosis not present

## 2019-10-09 DIAGNOSIS — Z7984 Long term (current) use of oral hypoglycemic drugs: Secondary | ICD-10-CM | POA: Diagnosis not present

## 2019-10-09 DIAGNOSIS — M25571 Pain in right ankle and joints of right foot: Secondary | ICD-10-CM | POA: Diagnosis not present

## 2019-10-09 DIAGNOSIS — I10 Essential (primary) hypertension: Secondary | ICD-10-CM | POA: Diagnosis not present

## 2019-10-13 DIAGNOSIS — M7661 Achilles tendinitis, right leg: Secondary | ICD-10-CM | POA: Diagnosis not present

## 2019-10-13 DIAGNOSIS — M25552 Pain in left hip: Secondary | ICD-10-CM | POA: Diagnosis not present

## 2019-10-13 DIAGNOSIS — M25571 Pain in right ankle and joints of right foot: Secondary | ICD-10-CM | POA: Diagnosis not present

## 2019-10-13 DIAGNOSIS — M25561 Pain in right knee: Secondary | ICD-10-CM | POA: Diagnosis not present

## 2019-10-17 ENCOUNTER — Ambulatory Visit: Payer: Medicare HMO | Admitting: Orthotics

## 2019-10-20 DIAGNOSIS — E1149 Type 2 diabetes mellitus with other diabetic neurological complication: Secondary | ICD-10-CM | POA: Diagnosis not present

## 2019-10-20 DIAGNOSIS — I10 Essential (primary) hypertension: Secondary | ICD-10-CM | POA: Diagnosis not present

## 2019-10-20 DIAGNOSIS — E785 Hyperlipidemia, unspecified: Secondary | ICD-10-CM | POA: Diagnosis not present

## 2019-10-20 DIAGNOSIS — Z79899 Other long term (current) drug therapy: Secondary | ICD-10-CM | POA: Diagnosis not present

## 2019-10-20 DIAGNOSIS — E1165 Type 2 diabetes mellitus with hyperglycemia: Secondary | ICD-10-CM | POA: Diagnosis not present

## 2019-10-20 DIAGNOSIS — E039 Hypothyroidism, unspecified: Secondary | ICD-10-CM | POA: Diagnosis not present

## 2019-10-20 DIAGNOSIS — F339 Major depressive disorder, recurrent, unspecified: Secondary | ICD-10-CM | POA: Diagnosis not present

## 2019-10-20 DIAGNOSIS — G43909 Migraine, unspecified, not intractable, without status migrainosus: Secondary | ICD-10-CM | POA: Diagnosis not present

## 2019-10-20 DIAGNOSIS — E559 Vitamin D deficiency, unspecified: Secondary | ICD-10-CM | POA: Diagnosis not present

## 2019-10-23 DIAGNOSIS — M25561 Pain in right knee: Secondary | ICD-10-CM | POA: Diagnosis not present

## 2019-10-23 DIAGNOSIS — M25571 Pain in right ankle and joints of right foot: Secondary | ICD-10-CM | POA: Diagnosis not present

## 2019-10-23 DIAGNOSIS — M7661 Achilles tendinitis, right leg: Secondary | ICD-10-CM | POA: Diagnosis not present

## 2019-10-23 DIAGNOSIS — M25552 Pain in left hip: Secondary | ICD-10-CM | POA: Diagnosis not present

## 2019-10-26 DIAGNOSIS — M25561 Pain in right knee: Secondary | ICD-10-CM | POA: Diagnosis not present

## 2019-10-26 DIAGNOSIS — M7661 Achilles tendinitis, right leg: Secondary | ICD-10-CM | POA: Diagnosis not present

## 2019-10-26 DIAGNOSIS — M25552 Pain in left hip: Secondary | ICD-10-CM | POA: Diagnosis not present

## 2019-10-26 DIAGNOSIS — M25571 Pain in right ankle and joints of right foot: Secondary | ICD-10-CM | POA: Diagnosis not present

## 2019-10-26 DIAGNOSIS — M1711 Unilateral primary osteoarthritis, right knee: Secondary | ICD-10-CM | POA: Diagnosis not present

## 2019-10-27 ENCOUNTER — Ambulatory Visit: Payer: Medicare HMO | Admitting: Orthotics

## 2019-10-27 ENCOUNTER — Other Ambulatory Visit: Payer: Self-pay

## 2019-10-27 DIAGNOSIS — E1169 Type 2 diabetes mellitus with other specified complication: Secondary | ICD-10-CM

## 2019-10-27 DIAGNOSIS — E669 Obesity, unspecified: Secondary | ICD-10-CM

## 2019-10-27 NOTE — Progress Notes (Signed)
Shoes fit

## 2019-10-30 DIAGNOSIS — G4733 Obstructive sleep apnea (adult) (pediatric): Secondary | ICD-10-CM | POA: Diagnosis not present

## 2019-11-01 DIAGNOSIS — M9902 Segmental and somatic dysfunction of thoracic region: Secondary | ICD-10-CM | POA: Diagnosis not present

## 2019-11-01 DIAGNOSIS — Z9013 Acquired absence of bilateral breasts and nipples: Secondary | ICD-10-CM | POA: Diagnosis not present

## 2019-11-01 DIAGNOSIS — M47817 Spondylosis without myelopathy or radiculopathy, lumbosacral region: Secondary | ICD-10-CM | POA: Diagnosis not present

## 2019-11-01 DIAGNOSIS — M7661 Achilles tendinitis, right leg: Secondary | ICD-10-CM | POA: Diagnosis not present

## 2019-11-01 DIAGNOSIS — M4724 Other spondylosis with radiculopathy, thoracic region: Secondary | ICD-10-CM | POA: Diagnosis not present

## 2019-11-01 DIAGNOSIS — M9903 Segmental and somatic dysfunction of lumbar region: Secondary | ICD-10-CM | POA: Diagnosis not present

## 2019-11-01 DIAGNOSIS — M4722 Other spondylosis with radiculopathy, cervical region: Secondary | ICD-10-CM | POA: Diagnosis not present

## 2019-11-01 DIAGNOSIS — M25561 Pain in right knee: Secondary | ICD-10-CM | POA: Diagnosis not present

## 2019-11-01 DIAGNOSIS — M25571 Pain in right ankle and joints of right foot: Secondary | ICD-10-CM | POA: Diagnosis not present

## 2019-11-01 DIAGNOSIS — M9901 Segmental and somatic dysfunction of cervical region: Secondary | ICD-10-CM | POA: Diagnosis not present

## 2019-11-01 DIAGNOSIS — M25552 Pain in left hip: Secondary | ICD-10-CM | POA: Diagnosis not present

## 2019-11-02 DIAGNOSIS — G4733 Obstructive sleep apnea (adult) (pediatric): Secondary | ICD-10-CM | POA: Diagnosis not present

## 2019-11-06 DIAGNOSIS — M25561 Pain in right knee: Secondary | ICD-10-CM | POA: Diagnosis not present

## 2019-11-06 DIAGNOSIS — M25552 Pain in left hip: Secondary | ICD-10-CM | POA: Diagnosis not present

## 2019-11-06 DIAGNOSIS — M7661 Achilles tendinitis, right leg: Secondary | ICD-10-CM | POA: Diagnosis not present

## 2019-11-06 DIAGNOSIS — M25571 Pain in right ankle and joints of right foot: Secondary | ICD-10-CM | POA: Diagnosis not present

## 2019-11-13 DIAGNOSIS — N3000 Acute cystitis without hematuria: Secondary | ICD-10-CM | POA: Diagnosis not present

## 2019-11-15 DIAGNOSIS — M25571 Pain in right ankle and joints of right foot: Secondary | ICD-10-CM | POA: Diagnosis not present

## 2019-11-15 DIAGNOSIS — M7661 Achilles tendinitis, right leg: Secondary | ICD-10-CM | POA: Diagnosis not present

## 2019-11-15 DIAGNOSIS — M25561 Pain in right knee: Secondary | ICD-10-CM | POA: Diagnosis not present

## 2019-11-15 DIAGNOSIS — M25552 Pain in left hip: Secondary | ICD-10-CM | POA: Diagnosis not present

## 2019-11-17 DIAGNOSIS — M25561 Pain in right knee: Secondary | ICD-10-CM | POA: Diagnosis not present

## 2019-11-17 DIAGNOSIS — M25571 Pain in right ankle and joints of right foot: Secondary | ICD-10-CM | POA: Diagnosis not present

## 2019-11-17 DIAGNOSIS — M7661 Achilles tendinitis, right leg: Secondary | ICD-10-CM | POA: Diagnosis not present

## 2019-11-17 DIAGNOSIS — M25552 Pain in left hip: Secondary | ICD-10-CM | POA: Diagnosis not present

## 2019-11-20 NOTE — Progress Notes (Signed)
Office Visit Note  Patient: Melanie Meyers             Date of Birth: 01/14/1955           MRN: [id number]             PCP: Janine Limbo, PA-C Referring: Suella Broad, MD Visit Date: 12/04/2019 Occupation: @GUAROCC @  Subjective:  Pain in multiple joints   History of Present Illness: Melanie Meyers is a 64 y.o. female seen in consultation per request of Dr. Epifania Gore.  According the patient she has had history of arthritis in multiple joints for many years.  She was diagnosed with degenerative disc disease of cervical spine and underwent fusion by Dr. Rolena Infante.  As she continued to have pain and discomfort in her cervical spine she has been getting cortisone injections by Dr. Epifania Gore.  She states she recently complained to Dr. Alcide Evener about pain in multiple joints and he referred her here for further evaluation.  She has been also seeing Dr. Cannon Kettle for foot pain.  She had recent x-rays and was scheduled to have MRI of her right foot.  She states that in 2015 she developed shortness of breath and bronchitis.  At the time she had a chest x-ray which showed some abnormality.  A CT scan of the chest showed some enlarged lymph nodes.  She was sent to [facility] where she had lymph node biopsy and was diagnosed with sarcoidosis per patient.  She states she never developed active lung disease and was treated with inhalers only.  She does not recall being on prednisone or any DMARD agents.  She states she also has history of neuropathy for many years and has been followed by Dr. Delice Lesch.    Activities of Daily Living:  Patient reports morning stiffness for 1-5 minutes.   Patient Reports nocturnal pain.  Difficulty dressing/grooming: Denies Difficulty climbing stairs: Denies Difficulty getting out of chair: Denies Difficulty using hands for taps, buttons, cutlery, and/or writing: Reports  Review of Systems  Constitutional: Negative for fatigue.  HENT: Positive for mouth dryness. Negative for  nose dryness.   Eyes: Negative for pain, itching, visual disturbance and dryness.  Respiratory: Positive for cough and shortness of breath. Negative for hemoptysis and difficulty breathing.   Cardiovascular: Positive for palpitations. Negative for chest pain and swelling in legs/feet.  Gastrointestinal: Positive for abdominal pain, constipation and diarrhea. Negative for blood in stool.  Endocrine: Negative for increased urination.  Genitourinary: Negative for painful urination.  Musculoskeletal: Positive for arthralgias, joint pain, joint swelling, myalgias, morning stiffness, muscle tenderness and myalgias. Negative for muscle weakness.  Skin: Negative for color change, rash and redness.  Allergic/Immunologic: Negative for susceptible to infections.  Neurological: Positive for dizziness, numbness, headaches, parasthesias and memory loss. Negative for weakness.  Hematological: Negative for swollen glands.  Psychiatric/Behavioral: Positive for confusion. Negative for sleep disturbance.    PMFS History:  Patient Active Problem List   Diagnosis Date Noted  . Acute tear of lateral meniscus of right knee with peripheral detachment 10/13/2018  . Pain in right knee 09/21/2018  . Major depressive disorder, recurrent episode with anxious distress ([place])   . Acquired hallux rigidus of right foot 04/13/2018  . Osteoarthritis of knee 03/28/2018  . Pes anserinus bursitis of right knee 03/28/2018  . CAD (coronary artery disease) 11/10/2017  . Osteoarthritis of finger of right hand 02/10/2017  . Chronic tension-type headache, not intractable 07/10/2016  . Diabetic peripheral neuropathy ([place]) 05/17/2016  . HTN (hypertension)  06/20/2015  . Hyperlipidemia 06/20/2015  . Morbid obesity ([place]) 06/20/2015  . Metatarsal deformity 05/30/2014  . Metatarsalgia 05/30/2014  . Hand paresthesia 06/27/2013  . Hypothyroidism 06/02/2013  . Type 2 diabetes mellitus ([place]) 06/02/2013  . Sarcoidosis of lung with  sarcoidosis of lymph nodes ([place]) 02/10/2013  . Nerve root pain 01/27/2013  . Narrowing of intervertebral disc space 01/27/2013  . Myofascial pain 01/27/2013  . Cervical spine pain 11/24/2012  . Cervical osteoarthritis 11/24/2012  . Carpal tunnel syndrome 01/15/2012  . Asthma 01/20/2008    Past Medical History:  Diagnosis Date  . Asthma   . B12 deficiency   . Back pain   . CAD (coronary artery disease) 11/10/2017  . Carpal tunnel syndrome of right wrist 01/15/2012  . Chronic tension-type headache, not intractable 07/10/2016  . Degenerative arthritis    degenerative arthritis of neck  . Depression   . Diabetic peripheral neuropathy ([place]) 05/17/2016  . Fibromyalgia   . Generalized anxiety disorder   . GERD (gastroesophageal reflux disease)   . Hyperlipidemia 06/20/2015  . Hypertension   . Hypothyroidism 06/02/2013  . Insomnia   . Major depressive disorder, recurrent episode with anxious distress ([place])   . Obstructive sleep apnea    On CPAP  . Raynaud's disease    Per patient  . Sarcoidosis   . Transient alteration of awareness 10/10/2015  . Type II Diabetes Mellitus without complication ([place])   . Vitamin D deficiency     Family History  Problem Relation Age of Onset  . Parkinson's disease Mother   . Diabetes Mother   . Alzheimer's disease Mother   . Gout Mother   . High blood pressure Father   . High Cholesterol Father   . Heart attack Father   . Thyroid disease Sister   . Heart attack Sister   . Thyroid disease Sister   . Thyroid disease Sister   . Allergies Daughter   . Thyroid disease Daughter   . Rheum arthritis Cousin    Past Surgical History:  Procedure Laterality Date  . ANTERIOR CERVICAL DECOMP/DISCECTOMY FUSION N/A 10/06/2017   Procedure: ANTERIOR CERVICAL DECOMPRESSION/DISCECTOMY FUSION C4-7;  Surgeon: Melina Schools, MD;  Location: [place];  Service: Orthopedics;  Laterality: N/A;  4.5 hrs  . BREAST REDUCTION SURGERY  02/18/2015  . CARPAL TUNNEL RELEASE  2014   . CARPAL TUNNEL RELEASE  04/2016  . [place]  . LEFT HEART CATH AND CORONARY ANGIOGRAPHY N/A 09/29/2017   Procedure: LEFT HEART CATH AND CORONARY ANGIOGRAPHY;  Surgeon: Martinique, Peter M, MD;  Location: [place] [facility];  Service: Cardiovascular;  Laterality: N/A;  . NASAL POLYP SURGERY  1983  . SINOSCOPY  10/09/2014  . TONSILLECTOMY  1978  . TUBAL LIGATION     Social History   Social History Narrative  . Not on file    There is no immunization history on file for this patient.   Objective: Vital Signs: BP (!) 100/58 (BP Location: Left Arm, Patient Position: Sitting, Cuff Size: Small)   Pulse 61   Ht 5' 0.25" (1.53 m)   Wt 218 lb 3.2 oz (99 kg)   LMP  (LMP Unknown) Comment: tubal ligation  BMI 42.26 kg/m    Physical Exam Vitals and nursing note reviewed.  Constitutional:      Appearance: She is well-developed.  HENT:     Head: Normocephalic and atraumatic.  Eyes:     Conjunctiva/sclera: Conjunctivae normal.  Cardiovascular:  Rate and Rhythm: Normal rate and regular rhythm.     Heart sounds: Normal heart sounds.  Pulmonary:     Effort: Pulmonary effort is normal.     Breath sounds: Normal breath sounds.  Abdominal:     General: Bowel sounds are normal.     Palpations: Abdomen is soft.  Musculoskeletal:     Cervical back: Normal range of motion.  Lymphadenopathy:     Cervical: No cervical adenopathy.  Skin:    General: Skin is warm and dry.     Capillary Refill: Capillary refill takes less than 2 seconds.  Neurological:     Mental Status: She is alert and oriented to person, place, and time.  Psychiatric:        Behavior: Behavior normal.      Musculoskeletal Exam: She has limited range of motion of the cervical spine.  Shoulder joints, elbow joints, wrist joints, MCPs PIPs and DIPs with good range of motion with no synovitis.  She has some PIP and DIP thickening consistent with osteoarthritis.  Hip joints and knee joints with good  range of motion.  She had bilateral pes planus.  There was no evidence of plantar fasciitis or Achilles tendinitis on examination today.  CDAI Exam: CDAI Score: -- Patient Global: --; Provider Global: -- Swollen: --; Tender: -- Joint Exam 12/04/2019   No joint exam has been documented for this visit   There is currently no information documented on the homunculus. Go to the Rheumatology activity and complete the homunculus joint exam.  Investigation: No additional findings.  Imaging: No results found.  Recent Labs: Lab Results  Component Value Date   WBC 9.5 09/28/2017   HGB 12.5 09/28/2017   PLT 280 09/28/2017   NA 139 07/25/2019   K 4.5 07/25/2019   CL 106 07/25/2019   CO2 20 07/25/2019   GLUCOSE 103 (H) 07/25/2019   BUN 23 07/25/2019   CREATININE 0.97 07/25/2019   BILITOT <0.2 07/25/2019   ALKPHOS 77 07/25/2019   AST 18 07/25/2019   ALT 13 07/25/2019   PROT 7.0 07/25/2019   ALBUMIN 4.1 07/25/2019   CALCIUM 9.1 07/25/2019   GFRAA 72 07/25/2019    Speciality Comments: No specialty comments available.  Procedures:  No procedures performed Allergies: Accupril [quinapril hcl], Prednisone, and Levaquin [levofloxacin]   Assessment / Plan:     Visit Diagnoses: Polyarthralgia -she complains of pain and discomfort in multiple joints.  She states the discomfort has been for many years.  She notices intermittent swelling.  I do not see any synovitis on examination.  Referred by Dr. Nelva Bush  Pain in both hands -clinical findings are consistent with osteoarthritis.  Plan: XR Hand 2 View Right, XR Hand 2 View Left, these findings are consistent with osteoarthritis of the hands.  Sedimentation rate, Rheumatoid factor  Chronic pain of both knees -she complains of pain and discomfort in her bilateral knee joints.  No warmth swelling or effusion was noted.  Plan: XR KNEE 3 VIEW RIGHT, XR KNEE 3 VIEW LEFT.  These findings are consistent with severe osteoarthritis and severe  chondromalacia patella.  Pain in both feet -she has been followed by Dr. Cannon Kettle.  He is diagnosed her with bilateral pes planus, right foot Achilles tendinitis and plantar fasciitis in the past.  She had no evidence of plantar fasciitis or Achilles tendinitis today.  Plan: Uric acid, Angiotensin converting enzyme  DDD (degenerative disc disease), cervical - Status post fusion per patient.  She is followed by  Dr. Herma Mering for injections.  Myofascial pain-she gives history of generalized pain and hyperalgesia.  Other fatigue - Plan: CBC with Differential/Platelet, COMPLETE METABOLIC PANEL WITH GFR, CK, TSH  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 (HCC) - Followed by Dr. Garret Reddish.  I do not have records available.  We will get records from Dr. Ruben Gottron office.  Patient states she never required prednisone or DMARDs.  She has never had active lung disease.  She was treated with inhalers.  Acute tear of lateral meniscus of right knee with peripheral detachment  Acquired hallux rigidus of right foot  Diabetic peripheral neuropathy (HCC)-followed by Dr. Delice Lesch.  Other medical problems are listed as follows:  History of hypothyroidism  History of hyperlipidemia  Essential hypertension  History of asthma  Chronic tension-type headache, not intractable  Major depressive disorder, recurrent episode with anxious distress ([place])  Patient does not like to keep mask on her face.  I reminded her several times increased risk of infection.  Use of mask, social distancing and hand hygiene was emphasized.  Orders: Orders Placed This Encounter  Procedures  . XR Hand 2 View Right  . XR Hand 2 View Left  . XR KNEE 3 VIEW RIGHT  . XR KNEE 3 VIEW LEFT  . CBC with Differential/Platelet  . COMPLETE METABOLIC PANEL WITH GFR  . CK  . TSH  . Sedimentation rate  . Rheumatoid factor  . Uric acid  . Angiotensin converting enzyme   No orders of the defined types were placed in this  encounter.    Follow-Up Instructions: Return for Pain in multiple joints.   Bo Merino, MD  Note - This record has been created using Editor, commissioning.  Chart creation errors have been sought, but may not always  have been located. Such creation errors do not reflect on  the standard of medical care.

## 2019-11-21 DIAGNOSIS — M25552 Pain in left hip: Secondary | ICD-10-CM | POA: Diagnosis not present

## 2019-11-21 DIAGNOSIS — M25571 Pain in right ankle and joints of right foot: Secondary | ICD-10-CM | POA: Diagnosis not present

## 2019-11-21 DIAGNOSIS — M25561 Pain in right knee: Secondary | ICD-10-CM | POA: Diagnosis not present

## 2019-11-21 DIAGNOSIS — Z1231 Encounter for screening mammogram for malignant neoplasm of breast: Secondary | ICD-10-CM | POA: Diagnosis not present

## 2019-11-21 DIAGNOSIS — M7661 Achilles tendinitis, right leg: Secondary | ICD-10-CM | POA: Diagnosis not present

## 2019-11-22 DIAGNOSIS — F5104 Psychophysiologic insomnia: Secondary | ICD-10-CM | POA: Diagnosis not present

## 2019-11-22 DIAGNOSIS — E1165 Type 2 diabetes mellitus with hyperglycemia: Secondary | ICD-10-CM | POA: Diagnosis not present

## 2019-11-22 DIAGNOSIS — Z6841 Body Mass Index (BMI) 40.0 and over, adult: Secondary | ICD-10-CM | POA: Diagnosis not present

## 2019-11-22 DIAGNOSIS — E1149 Type 2 diabetes mellitus with other diabetic neurological complication: Secondary | ICD-10-CM | POA: Diagnosis not present

## 2019-11-22 DIAGNOSIS — R350 Frequency of micturition: Secondary | ICD-10-CM | POA: Diagnosis not present

## 2019-11-24 ENCOUNTER — Telehealth: Payer: Self-pay

## 2019-11-24 DIAGNOSIS — E785 Hyperlipidemia, unspecified: Secondary | ICD-10-CM | POA: Diagnosis not present

## 2019-11-24 DIAGNOSIS — Z1331 Encounter for screening for depression: Secondary | ICD-10-CM | POA: Diagnosis not present

## 2019-11-24 DIAGNOSIS — E669 Obesity, unspecified: Secondary | ICD-10-CM | POA: Diagnosis not present

## 2019-11-24 DIAGNOSIS — Z9181 History of falling: Secondary | ICD-10-CM | POA: Diagnosis not present

## 2019-11-24 DIAGNOSIS — Z0000 Encounter for general adult medical examination without abnormal findings: Secondary | ICD-10-CM | POA: Diagnosis not present

## 2019-11-24 MED ORDER — OLMESARTAN MEDOXOMIL 20 MG PO TABS: 10.0000 mg | ORAL_TABLET | Freq: Every day | ORAL | 1 refills | Status: DC

## 2019-11-24 MED ORDER — METOPROLOL SUCCINATE ER 25 MG PO TB24: 25.0000 mg | ORAL_TABLET | Freq: Every day | ORAL | 2 refills | Status: DC

## 2019-11-24 NOTE — Telephone Encounter (Signed)
Rx refill sent to pharmacy. 

## 2019-11-27 DIAGNOSIS — M5388 Other specified dorsopathies, sacral and sacrococcygeal region: Secondary | ICD-10-CM | POA: Diagnosis not present

## 2019-11-27 DIAGNOSIS — M9902 Segmental and somatic dysfunction of thoracic region: Secondary | ICD-10-CM | POA: Diagnosis not present

## 2019-11-27 DIAGNOSIS — M4724 Other spondylosis with radiculopathy, thoracic region: Secondary | ICD-10-CM | POA: Diagnosis not present

## 2019-11-27 DIAGNOSIS — M9901 Segmental and somatic dysfunction of cervical region: Secondary | ICD-10-CM | POA: Diagnosis not present

## 2019-11-27 DIAGNOSIS — M9904 Segmental and somatic dysfunction of sacral region: Secondary | ICD-10-CM | POA: Diagnosis not present

## 2019-11-27 DIAGNOSIS — M4722 Other spondylosis with radiculopathy, cervical region: Secondary | ICD-10-CM | POA: Diagnosis not present

## 2019-11-30 DIAGNOSIS — G4733 Obstructive sleep apnea (adult) (pediatric): Secondary | ICD-10-CM | POA: Diagnosis not present

## 2019-12-04 ENCOUNTER — Ambulatory Visit (INDEPENDENT_AMBULATORY_CARE_PROVIDER_SITE_OTHER): Payer: Medicare HMO | Admitting: Rheumatology

## 2019-12-04 ENCOUNTER — Ambulatory Visit: Payer: Self-pay

## 2019-12-04 ENCOUNTER — Other Ambulatory Visit: Payer: Self-pay

## 2019-12-04 ENCOUNTER — Encounter: Payer: Self-pay | Admitting: Rheumatology

## 2019-12-04 VITALS — BP 100/58 | HR 61 | Ht 60.25 in | Wt 218.2 lb

## 2019-12-04 DIAGNOSIS — Z8709 Personal history of other diseases of the respiratory system: Secondary | ICD-10-CM

## 2019-12-04 DIAGNOSIS — M25562 Pain in left knee: Secondary | ICD-10-CM

## 2019-12-04 DIAGNOSIS — M2550 Pain in unspecified joint: Secondary | ICD-10-CM | POA: Diagnosis not present

## 2019-12-04 DIAGNOSIS — M79672 Pain in left foot: Secondary | ICD-10-CM

## 2019-12-04 DIAGNOSIS — Z8639 Personal history of other endocrine, nutritional and metabolic disease: Secondary | ICD-10-CM

## 2019-12-04 DIAGNOSIS — M5030 Other cervical disc degeneration, unspecified cervical region: Secondary | ICD-10-CM | POA: Diagnosis not present

## 2019-12-04 DIAGNOSIS — M25552 Pain in left hip: Secondary | ICD-10-CM | POA: Diagnosis not present

## 2019-12-04 DIAGNOSIS — M79671 Pain in right foot: Secondary | ICD-10-CM

## 2019-12-04 DIAGNOSIS — M2021 Hallux rigidus, right foot: Secondary | ICD-10-CM

## 2019-12-04 DIAGNOSIS — F339 Major depressive disorder, recurrent, unspecified: Secondary | ICD-10-CM

## 2019-12-04 DIAGNOSIS — M25561 Pain in right knee: Secondary | ICD-10-CM

## 2019-12-04 DIAGNOSIS — R5383 Other fatigue: Secondary | ICD-10-CM | POA: Diagnosis not present

## 2019-12-04 DIAGNOSIS — M79642 Pain in left hand: Secondary | ICD-10-CM | POA: Diagnosis not present

## 2019-12-04 DIAGNOSIS — D862 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: Secondary | ICD-10-CM

## 2019-12-04 DIAGNOSIS — G44229 Chronic tension-type headache, not intractable: Secondary | ICD-10-CM

## 2019-12-04 DIAGNOSIS — G8929 Other chronic pain: Secondary | ICD-10-CM

## 2019-12-04 DIAGNOSIS — M79641 Pain in right hand: Secondary | ICD-10-CM | POA: Diagnosis not present

## 2019-12-04 DIAGNOSIS — S83261A Peripheral tear of lateral meniscus, current injury, right knee, initial encounter: Secondary | ICD-10-CM

## 2019-12-04 DIAGNOSIS — E1142 Type 2 diabetes mellitus with diabetic polyneuropathy: Secondary | ICD-10-CM

## 2019-12-04 DIAGNOSIS — M7918 Myalgia, other site: Secondary | ICD-10-CM

## 2019-12-04 DIAGNOSIS — I10 Essential (primary) hypertension: Secondary | ICD-10-CM

## 2019-12-04 DIAGNOSIS — M7661 Achilles tendinitis, right leg: Secondary | ICD-10-CM | POA: Diagnosis not present

## 2019-12-04 DIAGNOSIS — M25571 Pain in right ankle and joints of right foot: Secondary | ICD-10-CM | POA: Diagnosis not present

## 2019-12-04 NOTE — Patient Instructions (Signed)

## 2019-12-05 ENCOUNTER — Encounter: Payer: Self-pay | Admitting: Sports Medicine

## 2019-12-05 ENCOUNTER — Other Ambulatory Visit: Payer: Self-pay

## 2019-12-05 ENCOUNTER — Ambulatory Visit (INDEPENDENT_AMBULATORY_CARE_PROVIDER_SITE_OTHER): Payer: Medicare HMO | Admitting: Sports Medicine

## 2019-12-05 DIAGNOSIS — B351 Tinea unguium: Secondary | ICD-10-CM | POA: Diagnosis not present

## 2019-12-05 DIAGNOSIS — M79609 Pain in unspecified limb: Secondary | ICD-10-CM | POA: Diagnosis not present

## 2019-12-05 DIAGNOSIS — E669 Obesity, unspecified: Secondary | ICD-10-CM | POA: Diagnosis not present

## 2019-12-05 DIAGNOSIS — E1169 Type 2 diabetes mellitus with other specified complication: Secondary | ICD-10-CM

## 2019-12-05 NOTE — Progress Notes (Signed)
Subjective: Melanie Meyers is a 64 y.o. female patient with history of diabetes who presents to office today complaining of pain to toenails unable to trim and reports that her right foot pain is doing better now that she is in physical therapy.  Fasting blood sugar this morning 82 A1c 6.1 PCP Gre[place] 2 weeks ago  Patient Active Problem List   Diagnosis Date Noted  . Acute tear of lateral meniscus of right knee with peripheral detachment 10/13/2018  . Pain in right knee 09/21/2018  . Major depressive disorder, recurrent episode with anxious distress ([place])   . Acquired hallux rigidus of right foot 04/13/2018  . Osteoarthritis of knee 03/28/2018  . Pes anserinus bursitis of right knee 03/28/2018  . CAD (coronary artery disease) 11/10/2017  . Osteoarthritis of finger of right hand 02/10/2017  . Chronic tension-type headache, not intractable 07/10/2016  . Diabetic peripheral neuropathy ([place]) 05/17/2016  . HTN (hypertension) 06/20/2015  . Hyperlipidemia 06/20/2015  . Morbid obesity ([place]) 06/20/2015  . Metatarsal deformity 05/30/2014  . Metatarsalgia 05/30/2014  . Hand paresthesia 06/27/2013  . Hypothyroidism 06/02/2013  . Type 2 diabetes mellitus ([place]) 06/02/2013  . 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 ([place]) 02/10/2013  . Nerve root pain 01/27/2013  . Narrowing of intervertebral disc space 01/27/2013  . Myofascial pain 01/27/2013  . Cervical spine pain 11/24/2012  . Cervical osteoarthritis 11/24/2012  . Carpal tunnel syndrome 01/15/2012  . Asthma 01/20/2008   Current Outpatient Medications on File Prior to Visit  Medication Sig Dispense Refill  . albuterol (VENTOLIN HFA) 108 (90 Base) MCG/ACT inhaler Inhale 2 puffs into the lungs every 6 (six) hours as needed for wheezing or shortness of breath.     Marland Kitchen aspirin EC 81 MG tablet Take 81 mg by mouth at bedtime.     . Azelastine HCl 137 MCG/SPRAY SOLN Place 1 spray into the nose as needed.     . Biotin 10000 MCG  TABS Take 10,000 mcg by mouth at bedtime.    . budesonide-formoterol (SYMBICORT) 160-4.5 MCG/ACT inhaler Inhale 1-2 puffs into the lungs 2 (two) times daily as needed.    . cetirizine (ZYRTEC) 10 MG tablet Take 10 mg by mouth at bedtime.  (Patient not taking: Reported on 12/04/2019)    . co-enzyme Q-10 30 MG capsule Take 30 mg by mouth at bedtime. (Patient not taking: Reported on 12/04/2019)    . diclofenac Sodium (VOLTAREN) 1 % GEL Apply 2-3 application topically 4 (four) times daily.    Marland Kitchen esomeprazole (NEXIUM) 20 MG capsule Take 20 mg by mouth daily at 12 noon.    . Insulin Glargine-Lixisenatide (SOLIQUA) 100-33 UNT-MCG/ML SOPN Inject 26 Units into the skin in the morning. 30 min before breakfast    . Insulin Pen Needle (DROPLET PEN NEEDLES) 31G X 6 MM MISC     . levothyroxine (SYNTHROID, LEVOTHROID) 25 MCG tablet Take 25 mcg by mouth daily before breakfast.    . lidocaine (LIDODERM) 5 % Place 1 patch onto the skin as needed.     Marland Kitchen LINZESS 145 MCG CAPS capsule 145 mcg as needed.    . meclizine (ANTIVERT) 12.5 MG tablet Take 1-2 tablets by mouth every 4 (four) hours as needed.    . Menthol, Topical Analgesic, (BIOFREEZE EX) Apply 1 application topically daily as needed (muscle pain).    . metFORMIN (GLUCOPHAGE) 1000 MG tablet Take 1 tablet (1,000 mg total) by mouth 2 (two) times daily with a meal. (Patient taking differently: Take 500  mg by mouth 2 (two) times daily with a meal. )    . methocarbamol (ROBAXIN) 500 MG tablet Take 1 tablet (500 mg total) by mouth 3 (three) times daily. (Patient taking differently: Take 500 mg by mouth 2 (two) times daily. ) 30 tablet 0  . metoprolol succinate (TOPROL-XL) 25 MG 24 hr tablet Take 1 tablet (25 mg total) by mouth daily. 90 tablet 2  . montelukast (SINGULAIR) 10 MG tablet Take 10 mg by mouth at bedtime.     . Multiple Vitamin (MULTIVITAMIN WITH MINERALS) TABS tablet Take 1 tablet by mouth at bedtime.    . naproxen (NAPROSYN) 500 MG tablet Take 500 mg by  mouth as needed.     . nitroGLYCERIN (NITROSTAT) 0.4 MG SL tablet Place 1 tablet (0.4 mg total) under the tongue every 5 (five) minutes as needed. 25 tablet 11  . olmesartan (BENICAR) 20 MG tablet Take 0.5 tablets (10 mg total) by mouth daily. 45 tablet 1  . Omega-3 Fatty Acids (FISH OIL) 1000 MG CAPS Take 2,000 mg by mouth at bedtime.    . polyethylene glycol (MIRALAX / GLYCOLAX) packet Take 17 g by mouth daily as needed for mild constipation.     . potassium gluconate (HM POTASSIUM) 595 (99 K) MG TABS tablet Take 595 mg by mouth daily as needed (cramps). (Patient not taking: Reported on 07/25/2019)    . pravastatin (PRAVACHOL) 40 MG tablet Take 40 mg by mouth daily.     . pregabalin (LYRICA) 150 MG capsule Take 1 cap in the morning and 2 caps in the evening 270 capsule 3  . promethazine (PHENERGAN) 12.5 MG tablet Take 1-2 tablets by mouth every 4 (four) hours as needed.    . rizatriptan (MAXALT) 10 MG tablet Take 10 mg by mouth as needed for migraine.     . sertraline (ZOLOFT) 100 MG tablet Take 100 mg by mouth daily.    . silver sulfADIAZINE (SILVADENE) 1 % cream as needed.    . TRULANCE 3 MG TABS Take 3 mg by mouth as needed.    . zolpidem (AMBIEN) 10 MG tablet      No current facility-administered medications on file prior to visit.   Allergies  Allergen Reactions  . Accupril [Quinapril Hcl] Cough  . Prednisone Other (See Comments)    Has to be cautious due to Blood Sugar  . Levaquin [Levofloxacin] Rash    Recent Results (from the past 2160 hour(s))  CBC with Differential/Platelet     Status: None   Collection Time: 12/04/19  9:45 AM  Result Value Ref Range   WBC 8.4 3.8 - 10.8 Thousand/uL   RBC 4.57 3.80 - 5.10 Million/uL   Hemoglobin 13.1 11.7 - 15.5 g/dL   HCT 40.1 35 - 45 %   MCV 87.7 80.0 - 100.0 fL   MCH 28.7 27.0 - 33.0 pg   MCHC 32.7 32.0 - 36.0 g/dL   RDW 14.4 11.0 - 15.0 %   Platelets 298 140 - 400 Thousand/uL   MPV 11.0 7.5 - 12.5 fL   Neutro Abs 5,401 1,500 -  7,800 cells/uL   Lymphs Abs 2,125 850 - 3,900 cells/uL   Absolute Monocytes 647 200 - 950 cells/uL   Eosinophils Absolute 168 15.0 - 500.0 cells/uL   Basophils Absolute 59 0.0 - 200.0 cells/uL   Neutrophils Relative % 64.3 %   Total Lymphocyte 25.3 %   Monocytes Relative 7.7 %   Eosinophils Relative 2.0 %   Basophils Relative  0.7 %  COMPLETE METABOLIC PANEL WITH GFR     Status: None   Collection Time: 12/04/19  9:45 AM  Result Value Ref Range   Glucose, Bld 111 65 - 139 mg/dL    Comment: .        Non-fasting reference interval .    BUN 20 7 - 25 mg/dL   Creat 0.97 0.50 - 0.99 mg/dL    Comment: For patients >49 years of age, the reference limit for Creatinine is approximately 13% higher for people identified as African-American. .    GFR, Est Non African American 62 > OR = 60 mL/min/1.29m2   GFR, Est African American 72 > OR = 60 mL/min/1.30m2   BUN/Creatinine Ratio NOT APPLICABLE 6 - 22 (calc)   Sodium 142 135 - 146 mmol/L   Potassium 4.5 3.5 - 5.3 mmol/L   Chloride 105 98 - 110 mmol/L   CO2 27 20 - 32 mmol/L   Calcium 9.6 8.6 - 10.4 mg/dL   Total Protein 6.9 6.1 - 8.1 g/dL   Albumin 4.0 3.6 - 5.1 g/dL   Globulin 2.9 1.9 - 3.7 g/dL (calc)   AG Ratio 1.4 1.0 - 2.5 (calc)   Total Bilirubin 0.2 0.2 - 1.2 mg/dL   Alkaline phosphatase (APISO) 74 37 - 153 U/L   AST 16 10 - 35 U/L   ALT 15 6 - 29 U/L  CK     Status: None   Collection Time: 12/04/19  9:45 AM  Result Value Ref Range   Total CK 90 29.0 - 143.0 U/L  TSH     Status: None   Collection Time: 12/04/19  9:45 AM  Result Value Ref Range   TSH 1.71 0.40 - 4.50 mIU/L  Sedimentation rate     Status: None   Collection Time: 12/04/19  9:45 AM  Result Value Ref Range   Sed Rate 11 0 - 30 mm/h  Rheumatoid factor     Status: None   Collection Time: 12/04/19  9:45 AM  Result Value Ref Range   Rhuematoid fact SerPl-aCnc <14 <14 IU/mL  Uric acid     Status: None   Collection Time: 12/04/19  9:45 AM  Result Value Ref  Range   Uric Acid, Serum 6.9 2.5 - 7.0 mg/dL    Comment: Therapeutic target for gout patients: <6.0 mg/dL .     Objective: General: Patient is awake, alert, and oriented x 3 and in no acute distress.  Integument: Skin is warm, dry and supple bilateral. Nails are mildly elongated thickened and dystrophic with subungual debris, consistent with onychomycosis, 1-5 bilateral. No signs of infection. No open lesions or preulcerative lesions present bilateral. Remaining integument unremarkable.  Vasculature:  Dorsalis Pedis pulse 1/4 bilateral. Posterior Tibial pulse  1/4 bilateral. Capillary fill time <3 sec 1-5 bilateral. Positive hair growth to the level of the digits.Temperature gradient within normal limits. No varicosities present bilateral. No edema present bilateral.   Neurology: Subjective sharp pains to the right heel.  Otherwise gross sensation present via light touch bilateral.  Musculoskeletal: There is KT tape noted to right heel and Achilles.  Muscular strength 5/5 in all lower extremity muscular groups bilateral without pain on range of motion except with mild guarding on the right foot due to pain which is slowly improving, no tenderness with calf compression bilateral.  Assessment and Plan: Problem List Items Addressed This Visit    None    Visit Diagnoses    Pain due to onychomycosis  of nail    -  Primary   Diabetes mellitus type 2 in obese [facility])          -Examined patient. -Mechanically debrided nails x10 using a sterile nail nipper without incident -Continue with physical therapy for previous foot pain -Continue with good supportive shoes daily for foot type. -Return in 3 months for routine foot care -Patient advised to call the office if any problems or questions arise in the meantime.  Landis Martins, DPM

## 2019-12-06 LAB — CBC WITH DIFFERENTIAL/PLATELET
Absolute Monocytes: 647 cells/uL (ref 200–950)
Basophils Absolute: 59 cells/uL (ref 0–200)
Basophils Relative: 0.7 %
Eosinophils Absolute: 168 cells/uL (ref 15–500)
Eosinophils Relative: 2 %
HCT: 40.1 % (ref 35.0–45.0)
Hemoglobin: 13.1 g/dL (ref 11.7–15.5)
Lymphs Abs: 2125 cells/uL (ref 850–3900)
MCH: 28.7 pg (ref 27.0–33.0)
MCHC: 32.7 g/dL (ref 32.0–36.0)
MCV: 87.7 fL (ref 80.0–100.0)
MPV: 11 fL (ref 7.5–12.5)
Monocytes Relative: 7.7 %
Neutro Abs: 5401 cells/uL (ref 1500–7800)
Neutrophils Relative %: 64.3 %
Platelets: 298 10*3/uL (ref 140–400)
RBC: 4.57 10*6/uL (ref 3.80–5.10)
RDW: 14.4 % (ref 11.0–15.0)
Total Lymphocyte: 25.3 %
WBC: 8.4 10*3/uL (ref 3.8–10.8)

## 2019-12-06 LAB — COMPLETE METABOLIC PANEL WITH GFR
AG Ratio: 1.4 (calc) (ref 1.0–2.5)
ALT: 15 U/L (ref 6–29)
AST: 16 U/L (ref 10–35)
Albumin: 4 g/dL (ref 3.6–5.1)
Alkaline phosphatase (APISO): 74 U/L (ref 37–153)
BUN: 20 mg/dL (ref 7–25)
CO2: 27 mmol/L (ref 20–32)
Calcium: 9.6 mg/dL (ref 8.6–10.4)
Chloride: 105 mmol/L (ref 98–110)
Creat: 0.97 mg/dL (ref 0.50–0.99)
GFR, Est African American: 72 mL/min/{1.73_m2} (ref >=60)
GFR, Est Non African American: 62 mL/min/{1.73_m2} (ref >=60)
Globulin: 2.9 g/dL (calc) (ref 1.9–3.7)
Glucose, Bld: 111 mg/dL (ref 65–139)
Potassium: 4.5 mmol/L (ref 3.5–5.3)
Sodium: 142 mmol/L (ref 135–146)
Total Bilirubin: 0.2 mg/dL (ref 0.2–1.2)
Total Protein: 6.9 g/dL (ref 6.1–8.1)

## 2019-12-06 LAB — URIC ACID: Uric Acid, Serum: 6.9 mg/dL (ref 2.5–7.0)

## 2019-12-06 LAB — RHEUMATOID FACTOR: Rheumatoid fact SerPl-aCnc: <14 IU/mL (ref <14)

## 2019-12-06 LAB — SEDIMENTATION RATE: Sed Rate: 11 mm/h (ref 0–30)

## 2019-12-06 LAB — CK: Total CK: 90 U/L (ref 29–143)

## 2019-12-06 LAB — TSH: TSH: 1.71 mIU/L (ref 0.40–4.50)

## 2019-12-06 LAB — ANGIOTENSIN CONVERTING ENZYME: Angiotensin-Converting Enzyme: 30 U/L (ref 9–67)

## 2019-12-06 NOTE — Progress Notes (Signed)
I will discuss results at the follow-up visit.

## 2019-12-09 NOTE — Progress Notes (Signed)
Office Visit Note  Patient: Melanie Meyers             Date of Birth: July 29, 1955           MRN: [id number]             PCP: Janine Limbo, PA-C Referring: Dr. Nelva Bush Visit Date: 12/22/2019 Occupation: @GUAROCC @  Subjective:  Pain in joints   History of Present Illness: Melanie Meyers is a 64 y.o. female with history of osteoarthritis and sarcoidosis.  She states that she has been having some discomfort in her hands and knee joints.  She has difficulty walking due to osteoarthritis in her knee joints.  She has been followed by [facility].  She states she has had cortisone injections and Visco supplement injections.  She has been offered total knee replacement but she is not mentally prepared for it.  None of the other joints are painful at this point.  She denies any joint swelling.  She denies any shortness of breath.  She has been followed by Dr. Roosvelt Maser for sarcoidosis.  She states she has never required any treatment as she does not have active disease.  Activities of Daily Living:  Patient reports morning stiffness for 30 minutes.   Patient Reports nocturnal pain.  Difficulty dressing/grooming: Denies Difficulty climbing stairs: Denies Difficulty getting out of chair: Denies Difficulty using hands for taps, buttons, cutlery, and/or writing: Reports  Review of Systems  Constitutional: Positive for fatigue.  HENT: Positive for nose dryness. Negative for mouth sores and mouth dryness.   Eyes: Negative for pain, itching and dryness.  Respiratory: Negative for shortness of breath and difficulty breathing.   Cardiovascular: Negative for chest pain and palpitations.  Gastrointestinal: Positive for constipation and diarrhea. Negative for blood in stool.  Endocrine: Negative for increased urination.  Genitourinary: Negative for difficulty urinating.  Musculoskeletal: Positive for arthralgias, joint pain, myalgias, morning stiffness, muscle tenderness and myalgias.  Negative for joint swelling.  Skin: Positive for color change. Negative for rash and redness.  Allergic/Immunologic: Positive for susceptible to infections.  Neurological: Positive for dizziness, numbness, headaches, memory loss and weakness.  Hematological: Positive for bruising/bleeding tendency.  Psychiatric/Behavioral: Positive for confusion. Negative for sleep disturbance.    PMFS History:  Patient Active Problem List   Diagnosis Date Noted  . Acute tear of lateral meniscus of right knee with peripheral detachment 10/13/2018  . Pain in right knee 09/21/2018  . Major depressive disorder, recurrent episode with anxious distress ([place])   . Acquired hallux rigidus of right foot 04/13/2018  . Osteoarthritis of knee 03/28/2018  . Pes anserinus bursitis of right knee 03/28/2018  . CAD (coronary artery disease) 11/10/2017  . Osteoarthritis of finger of right hand 02/10/2017  . Chronic tension-type headache, not intractable 07/10/2016  . Diabetic peripheral neuropathy ([place]) 05/17/2016  . HTN (hypertension) 06/20/2015  . Hyperlipidemia 06/20/2015  . Morbid obesity ([place]) 06/20/2015  . Metatarsal deformity 05/30/2014  . Metatarsalgia 05/30/2014  . Hand paresthesia 06/27/2013  . Hypothyroidism 06/02/2013  . Type 2 diabetes mellitus ([place]) 06/02/2013  . 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 Nerve root pain 01/27/2013  . Narrowing of intervertebral disc space 01/27/2013  . Myofascial pain 01/27/2013  . Cervical spine pain 11/24/2012  . Cervical osteoarthritis 11/24/2012  . Carpal tunnel syndrome 01/15/2012  . Asthma 01/20/2008    Past Medical History:  Diagnosis Date  . Asthma   . B12 deficiency   . Back pain   .  CAD (coronary artery disease) 11/10/2017  . Carpal tunnel syndrome of right wrist 01/15/2012  . Chronic tension-type headache, not intractable 07/10/2016  . Degenerative arthritis    degenerative arthritis of neck  . Depression   . Diabetic  peripheral neuropathy ([place]) 05/17/2016  . Fibromyalgia   . Generalized anxiety disorder   . GERD (gastroesophageal reflux disease)   . Hyperlipidemia 06/20/2015  . Hypertension   . Hypothyroidism 06/02/2013  . Insomnia   . Major depressive disorder, recurrent episode with anxious distress ([place])   . Obstructive sleep apnea    On CPAP  . Raynaud's disease    Per patient  . Sarcoidosis   . Transient alteration of awareness 10/10/2015  . Type II Diabetes Mellitus without complication ([place])   . Vitamin D deficiency     Family History  Problem Relation Age of Onset  . Parkinson's disease Mother   . Diabetes Mother   . Alzheimer's disease Mother   . Gout Mother   . High blood pressure Father   . High Cholesterol Father   . Heart attack Father   . Thyroid disease Sister   . Heart attack Sister   . Thyroid disease Sister   . Gout Sister   . Thyroid disease Sister   . Allergies Daughter   . Thyroid disease Daughter   . Rheum arthritis Cousin    Past Surgical History:  Procedure Laterality Date  . ANTERIOR CERVICAL DECOMP/DISCECTOMY FUSION N/A 10/06/2017   Procedure: ANTERIOR CERVICAL DECOMPRESSION/DISCECTOMY FUSION C4-7;  Surgeon: Melina Schools, MD;  Location: [facility];  Service: Orthopedics;  Laterality: N/A;  4.5 hrs  . BREAST REDUCTION SURGERY  02/18/2015  . CARPAL TUNNEL RELEASE  2014  . CARPAL TUNNEL RELEASE  04/2016  . [place]  . LEFT HEART CATH AND CORONARY ANGIOGRAPHY N/A 09/29/2017   Procedure: LEFT HEART CATH AND CORONARY ANGIOGRAPHY;  Surgeon: Martinique, Peter M, MD;  Location: [facility];  Service: Cardiovascular;  Laterality: N/A;  . NASAL POLYP SURGERY  1983  . SINOSCOPY  10/09/2014  . TONSILLECTOMY  1978  . TUBAL LIGATION     Social History   Social History Narrative  . Not on file    There is no immunization history on file for this patient.   Objective: Vital Signs: BP (!) 106/55 (BP Location: Left Arm, Patient Position:  Sitting, Cuff Size: Large)   Pulse 73   Resp 16   Ht 5' 0.25" (1.53 m)   Wt 217 lb 9.6 oz (98.7 kg)   LMP  (LMP Unknown) Comment: tubal ligation  BMI 42.15 kg/m    Physical Exam Vitals and nursing note reviewed.  Constitutional:      Appearance: She is well-developed and well-nourished.  HENT:     Head: Normocephalic and atraumatic.  Eyes:     Extraocular Movements: EOM normal.     Conjunctiva/sclera: Conjunctivae normal.  Cardiovascular:     Rate and Rhythm: Normal rate and regular rhythm.     Pulses: Intact distal pulses.     Heart sounds: Normal heart sounds.  Pulmonary:     Effort: Pulmonary effort is normal.     Breath sounds: Normal breath sounds.  Abdominal:     General: Bowel sounds are normal.     Palpations: Abdomen is soft.  Musculoskeletal:     Cervical back: Normal range of motion.  Lymphadenopathy:     Cervical: No cervical adenopathy.  Skin:    General: Skin is warm  and dry.     Capillary Refill: Capillary refill takes less than 2 seconds.  Neurological:     Mental Status: She is alert and oriented to person, place, and time.  Psychiatric:        Mood and Affect: Mood and affect normal.        Behavior: Behavior normal.      Musculoskeletal Exam: C-spine was in good range of motion.  Shoulder joints, elbow joints, wrist joints, MCPs PIPs DIPs with good range of motion.  She has bilateral PIP and DIP thickening with no synovitis.  Hip joints with good range of motion.  She had good range of motion of bilateral knee joints with discomfort but no warmth swelling effusion was noted.  There was no tenderness over ankle joints or swelling was noted.  There was no tenderness over MTPs.  CDAI Exam: CDAI Score: -- Patient Global: --; Provider Global: -- Swollen: --; Tender: -- Joint Exam 12/22/2019   No joint exam has been documented for this visit   There is currently no information documented on the homunculus. Go to the Rheumatology activity and complete  the homunculus joint exam.  Investigation: No additional findings.  Imaging: XR Hand 2 View Left  Result Date: 12/04/2019  PIP and DIP narrowing was noted.  Severe CMC narrowing was noted.  No MCP, intercarpal or radiocarpal joint space narrowing was noted.  No erosive changes were noted. Impression: These findings are consistent with severe osteoarthritis of the hand.  XR Hand 2 View Right  Result Date: 12/04/2019 PIP and DIP narrowing was noted.  Severe CMC narrowing was noted.  No MCP, intercarpal or radiocarpal joint space narrowing was noted.  No erosive changes were noted. Impression: These findings are consistent with severe osteoarthritis of the hand.  XR KNEE 3 VIEW LEFT  Result Date: 12/04/2019 Severe medial compartment narrowing with medial osteophytes were noted.  Severe patellofemoral narrowing was noted.  No chondrocalcinosis was noted. Impression: These findings are consistent with severe osteoarthritis and severe chondromalacia patella.  XR KNEE 3 VIEW RIGHT  Result Date: 12/04/2019 Severe medial compartment narrowing with medial osteophytes and intercondylar osteophytes were noted.  Severe patellofemoral narrowing was noted. Impression: These findings are consistent with severe osteoarthritis and severe chondromalacia patella.   Recent Labs: Lab Results  Component Value Date   WBC 8.4 12/04/2019   HGB 13.1 12/04/2019   PLT 298 12/04/2019   NA 142 12/04/2019   K 4.5 12/04/2019   CL 105 12/04/2019   CO2 27 12/04/2019   GLUCOSE 111 12/04/2019   BUN 20 12/04/2019   CREATININE 0.97 12/04/2019   BILITOT 0.2 12/04/2019   ALKPHOS 77 07/25/2019   AST 16 12/04/2019   ALT 15 12/04/2019   PROT 6.9 12/04/2019   ALBUMIN 4.1 07/25/2019   CALCIUM 9.6 12/04/2019   GFRAA 72 12/04/2019   December 04, 2019 CK 19, TSH normal, sed rate 11, RF negative, uric acid 6.9, ACE 30  Speciality Comments: No specialty comments available.  Procedures:  No procedures  performed Allergies: Accupril [quinapril hcl], Prednisone, and Levaquin [levofloxacin]   Assessment / Plan:     Visit Diagnoses: Primary osteoarthritis of both hands - Clinical and radiographic findings are consistent with osteoarthritis.  All autoimmune work-up negative.  All the left findings were discussed with patient and her daughter Jinny Blossom who was on the phone.  Joint protection muscle strengthening was discussed.  Have given her a handout on hand exercises.  Primary osteoarthritis of both knees -  Bilateral severe osteoarthritis and severe chondromalacia patella.  Patient has had cortisone injections and Visco supplement injections.  She has end-stage osteoarthritis.  She is not mentally prepared to have total knee replacement.  Doubt on lower extremity muscle strengthening exercises was given.  Weight loss diet and exercise was emphasized.  Acute tear of lateral meniscus of right knee with peripheral detachment  Pes planus of both feet - History of Achilles tendinitis and plantar fasciitis in the past per patient.  Proper fitting shoes with arch support were discussed.  Acquired hallux rigidus of right foot  DDD (degenerative disc disease), cervical - Followed by Dr. Nelva Bush and gets injections.  Myofascial pain-she has some generalized pain and discomfort from myofascial pain.  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 Carren Rang in [place].  Never treated with steroids or DMARDs per patient.  Uses inhalers. ACE level is normal.  Patient states that lung disease is currently not active.  She wants to come back only on as needed basis if she develops new symptoms.  Diabetic peripheral neuropathy (HCC)  Essential hypertension  History of hyperlipidemia  Major depressive disorder, recurrent episode with anxious distress (HCC)  Chronic tension-type headache, not intractable  History of asthma  History of hypothyroidism  Orders: No orders of the  defined types were placed in this encounter.  No orders of the defined types were placed in this encounter.     Follow-Up Instructions: Return if symptoms worsen or fail to improve, for Osteoarthritis, Sarcoidosis.   Bo Merino, MD  Note - This record has been created using Editor, commissioning.  Chart creation errors have been sought, but may not always  have been located. Such creation errors do not reflect on  the standard of medical care.

## 2019-12-13 DIAGNOSIS — N959 Unspecified menopausal and perimenopausal disorder: Secondary | ICD-10-CM | POA: Diagnosis not present

## 2019-12-13 DIAGNOSIS — Z13820 Encounter for screening for osteoporosis: Secondary | ICD-10-CM | POA: Diagnosis not present

## 2019-12-15 DIAGNOSIS — M25571 Pain in right ankle and joints of right foot: Secondary | ICD-10-CM | POA: Diagnosis not present

## 2019-12-15 DIAGNOSIS — M7661 Achilles tendinitis, right leg: Secondary | ICD-10-CM | POA: Diagnosis not present

## 2019-12-15 DIAGNOSIS — M25552 Pain in left hip: Secondary | ICD-10-CM | POA: Diagnosis not present

## 2019-12-15 DIAGNOSIS — M25561 Pain in right knee: Secondary | ICD-10-CM | POA: Diagnosis not present

## 2019-12-18 DIAGNOSIS — M7661 Achilles tendinitis, right leg: Secondary | ICD-10-CM | POA: Diagnosis not present

## 2019-12-18 DIAGNOSIS — M25552 Pain in left hip: Secondary | ICD-10-CM | POA: Diagnosis not present

## 2019-12-18 DIAGNOSIS — M25571 Pain in right ankle and joints of right foot: Secondary | ICD-10-CM | POA: Diagnosis not present

## 2019-12-18 DIAGNOSIS — M25561 Pain in right knee: Secondary | ICD-10-CM | POA: Diagnosis not present

## 2019-12-21 DIAGNOSIS — M25552 Pain in left hip: Secondary | ICD-10-CM | POA: Diagnosis not present

## 2019-12-21 DIAGNOSIS — M25571 Pain in right ankle and joints of right foot: Secondary | ICD-10-CM | POA: Diagnosis not present

## 2019-12-21 DIAGNOSIS — M25561 Pain in right knee: Secondary | ICD-10-CM | POA: Diagnosis not present

## 2019-12-21 DIAGNOSIS — M7661 Achilles tendinitis, right leg: Secondary | ICD-10-CM | POA: Diagnosis not present

## 2019-12-22 ENCOUNTER — Ambulatory Visit (INDEPENDENT_AMBULATORY_CARE_PROVIDER_SITE_OTHER): Payer: Medicare HMO | Admitting: Rheumatology

## 2019-12-22 ENCOUNTER — Encounter: Payer: Self-pay | Admitting: Rheumatology

## 2019-12-22 ENCOUNTER — Emergency Department (HOSPITAL_COMMUNITY)
Admission: EM | Admit: 2019-12-22 | Discharge: 2019-12-23 | Disposition: A | Discharge status: Home / Self Care | Payer: Medicare HMO | Attending: Emergency Medicine | Admitting: Emergency Medicine

## 2019-12-22 ENCOUNTER — Other Ambulatory Visit: Payer: Self-pay

## 2019-12-22 VITALS — BP 106/55 | HR 73 | Resp 16 | Ht 60.25 in | Wt 217.6 lb

## 2019-12-22 DIAGNOSIS — I10 Essential (primary) hypertension: Secondary | ICD-10-CM

## 2019-12-22 DIAGNOSIS — E1142 Type 2 diabetes mellitus with diabetic polyneuropathy: Secondary | ICD-10-CM | POA: Diagnosis not present

## 2019-12-22 DIAGNOSIS — M2021 Hallux rigidus, right foot: Secondary | ICD-10-CM

## 2019-12-22 DIAGNOSIS — M170 Bilateral primary osteoarthritis of knee: Secondary | ICD-10-CM

## 2019-12-22 DIAGNOSIS — M2141 Flat foot [pes planus] (acquired), right foot: Secondary | ICD-10-CM

## 2019-12-22 DIAGNOSIS — M2142 Flat foot [pes planus] (acquired), left foot: Secondary | ICD-10-CM

## 2019-12-22 DIAGNOSIS — R000 Tachycardia, unspecified: Secondary | ICD-10-CM | POA: Insufficient documentation

## 2019-12-22 DIAGNOSIS — M19041 Primary osteoarthritis, right hand: Secondary | ICD-10-CM

## 2019-12-22 DIAGNOSIS — Z5321 Procedure and treatment not carried out due to patient leaving prior to being seen by health care provider: Secondary | ICD-10-CM | POA: Insufficient documentation

## 2019-12-22 DIAGNOSIS — D862 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: Secondary | ICD-10-CM | POA: Diagnosis not present

## 2019-12-22 DIAGNOSIS — M5030 Other cervical disc degeneration, unspecified cervical region: Secondary | ICD-10-CM

## 2019-12-22 DIAGNOSIS — G44229 Chronic tension-type headache, not intractable: Secondary | ICD-10-CM

## 2019-12-22 DIAGNOSIS — S83261A Peripheral tear of lateral meniscus, current injury, right knee, initial encounter: Secondary | ICD-10-CM

## 2019-12-22 DIAGNOSIS — M19042 Primary osteoarthritis, left hand: Secondary | ICD-10-CM

## 2019-12-22 DIAGNOSIS — F339 Major depressive disorder, recurrent, unspecified: Secondary | ICD-10-CM

## 2019-12-22 DIAGNOSIS — M7918 Myalgia, other site: Secondary | ICD-10-CM

## 2019-12-22 DIAGNOSIS — Z8709 Personal history of other diseases of the respiratory system: Secondary | ICD-10-CM

## 2019-12-22 DIAGNOSIS — Z8639 Personal history of other endocrine, nutritional and metabolic disease: Secondary | ICD-10-CM

## 2019-12-22 LAB — BASIC METABOLIC PANEL
Anion gap: 9 (ref 5–15)
BUN: 16 mg/dL (ref 8–23)
CO2: 24 mmol/L (ref 22–32)
Calcium: 9.3 mg/dL (ref 8.9–10.3)
Chloride: 104 mmol/L (ref 98–111)
Creatinine, Ser: 1.02 mg/dL — ABNORMAL HIGH (ref 0.44–1.00)
GFR, Estimated: >60 mL/min (ref >60)
Glucose, Bld: 124 mg/dL — ABNORMAL HIGH (ref 70–99)
Potassium: 4.1 mmol/L (ref 3.5–5.1)
Sodium: 137 mmol/L (ref 135–145)

## 2019-12-22 LAB — CBC
HCT: 40.1 % (ref 36.0–46.0)
Hemoglobin: 12.6 g/dL (ref 12.0–15.0)
MCH: 27.5 pg (ref 26.0–34.0)
MCHC: 31.4 g/dL (ref 30.0–36.0)
MCV: 87.6 fL (ref 80.0–100.0)
Platelets: 245 10*3/uL (ref 150–400)
RBC: 4.58 MIL/uL (ref 3.87–5.11)
RDW: 15.4 % (ref 11.5–15.5)
WBC: 8 10*3/uL (ref 4.0–10.5)
nRBC: 0 % (ref 0.0–0.2)

## 2019-12-22 NOTE — ED Triage Notes (Signed)
Pt presents to ED POV. Pt c/o tachycardia at home pt reports that her HR was 160 at home. Pt reports she took nitro x2. HR in triage 82

## 2019-12-22 NOTE — Patient Instructions (Signed)
Journal for Nurse Practitioners, 15(4), 263-267. Retrieved October 11, 2017 from [email or web address]">  Knee Exercises Ask your health care provider which exercises are safe for you. Do exercises exactly as told by your health care provider and adjust them as directed. It is normal to feel mild stretching, pulling, tightness, or discomfort as you do these exercises. Stop right away if you feel sudden pain or your pain gets worse. Do not begin these exercises until told by your health care provider. Stretching and range-of-motion exercises These exercises warm up your muscles and joints and improve the movement and flexibility of your knee. These exercises also help to relieve pain and swelling. Knee extension, prone 1. Lie on your abdomen (prone position) on a bed. 2. Place your left / right knee just beyond the edge of the surface so your knee is not on the bed. You can put a towel under your left / right thigh just above your kneecap for comfort. 3. Relax your leg muscles and allow gravity to straighten your knee (extension). You should feel a stretch behind your left / right knee. 4. Hold this position for __________ seconds. 5. Scoot up so your knee is supported between repetitions. Repeat __________ times. Complete this exercise __________ times a day. Knee flexion, active  1. Lie on your back with both legs straight. If this causes back discomfort, bend your left / right knee so your foot is flat on the floor. 2. Slowly slide your left / right heel back toward your buttocks. Stop when you feel a gentle stretch in the front of your knee or thigh (flexion). 3. Hold this position for __________ seconds. 4. Slowly slide your left / right heel back to the starting position. Repeat __________ times. Complete this exercise __________ times a day. Quadriceps stretch, prone  1. Lie on your abdomen on a firm surface, such as a bed or padded floor. 2. Bend your left / right knee and hold  your ankle. If you cannot reach your ankle or pant leg, loop a belt around your foot and grab the belt instead. 3. Gently pull your heel toward your buttocks. Your knee should not slide out to the side. You should feel a stretch in the front of your thigh and knee (quadriceps). 4. Hold this position for __________ seconds. Repeat __________ times. Complete this exercise __________ times a day. Hamstring, supine 1. Lie on your back (supine position). 2. Loop a belt or towel over the ball of your left / right foot. The ball of your foot is on the walking surface, right under your toes. 3. Straighten your left / right knee and slowly pull on the belt to raise your leg until you feel a gentle stretch behind your knee (hamstring). ? Do not let your knee bend while you do this. ? Keep your other leg flat on the floor. 4. Hold this position for __________ seconds. Repeat __________ times. Complete this exercise __________ times a day. Strengthening exercises These exercises build strength and endurance in your knee. Endurance is the ability to use your muscles for a long time, even after they get tired. Quadriceps, isometric This exercise stretches the muscles in front of your thigh (quadriceps) without moving your knee joint (isometric). 1. Lie on your back with your left / right leg extended and your other knee bent. Put a rolled towel or small pillow under your knee if told by your health care provider. 2. Slowly tense the muscles in the front of your left /   right thigh. You should see your kneecap slide up toward your hip or see increased dimpling just above the knee. This motion will push the back of the knee toward the floor. 3. For __________ seconds, hold the muscle as tight as you can without increasing your pain. 4. Relax the muscles slowly and completely. Repeat __________ times. Complete this exercise __________ times a day. Straight leg raises This exercise stretches the muscles in front  of your thigh (quadriceps) and the muscles that move your hips (hip flexors). 1. Lie on your back with your left / right leg extended and your other knee bent. 2. Tense the muscles in the front of your left / right thigh. You should see your kneecap slide up or see increased dimpling just above the knee. Your thigh may even shake a bit. 3. Keep these muscles tight as you raise your leg 4-6 inches (10-15 cm) off the floor. Do not let your knee bend. 4. Hold this position for __________ seconds. 5. Keep these muscles tense as you lower your leg. 6. Relax your muscles slowly and completely after each repetition. Repeat __________ times. Complete this exercise __________ times a day. Hamstring, isometric 1. Lie on your back on a firm surface. 2. Bend your left / right knee about __________ degrees. 3. Dig your left / right heel into the surface as if you are trying to pull it toward your buttocks. Tighten the muscles in the back of your thighs (hamstring) to "dig" as hard as you can without increasing any pain. 4. Hold this position for __________ seconds. 5. Release the tension gradually and allow your muscles to relax completely for __________ seconds after each repetition. Repeat __________ times. Complete this exercise __________ times a day. Hamstring curls If told by your health care provider, do this exercise while wearing ankle weights. Begin with __________ lb weights. Then increase the weight by 1 lb (0.5 kg) increments. Do not wear ankle weights that are more than __________ lb. 1. Lie on your abdomen with your legs straight. 2. Bend your left / right knee as far as you can without feeling pain. Keep your hips flat against the floor. 3. Hold this position for __________ seconds. 4. Slowly lower your leg to the starting position. Repeat __________ times. Complete this exercise __________ times a day. Squats This exercise strengthens the muscles in front of your thigh and knee  (quadriceps). 1. Stand in front of a table, with your feet and knees pointing straight ahead. You may rest your hands on the table for balance but not for support. 2. Slowly bend your knees and lower your hips like you are going to sit in a chair. ? Keep your weight over your heels, not over your toes. ? Keep your lower legs upright so they are parallel with the table legs. ? Do not let your hips go lower than your knees. ? Do not bend lower than told by your health care provider. ? If your knee pain increases, do not bend as low. 3. Hold the squat position for __________ seconds. 4. Slowly push with your legs to return to standing. Do not use your hands to pull yourself to standing. Repeat __________ times. Complete this exercise __________ times a day. Wall slides This exercise strengthens the muscles in front of your thigh and knee (quadriceps). 1. Lean your back against a smooth wall or door, and walk your feet out 18-24 inches (46-61 cm) from it. 2. Place your feet hip-width apart. 3.   Slowly slide down the wall or door until your knees bend __________ degrees. Keep your knees over your heels, not over your toes. Keep your knees in line with your hips. 4. Hold this position for __________ seconds. Repeat __________ times. Complete this exercise __________ times a day. Straight leg raises This exercise strengthens the muscles that rotate the leg at the hip and move it away from your body (hip abductors). 1. Lie on your side with your left / right leg in the top position. Lie so your head, shoulder, knee, and hip line up. You may bend your bottom knee to help you keep your balance. 2. Roll your hips slightly forward so your hips are stacked directly over each other and your left / right knee is facing forward. 3. Leading with your heel, lift your top leg 4-6 inches (10-15 cm). You should feel the muscles in your outer hip lifting. ? Do not let your foot drift forward. ? Do not let your knee  roll toward the ceiling. 4. Hold this position for __________ seconds. 5. Slowly return your leg to the starting position. 6. Let your muscles relax completely after each repetition. Repeat __________ times. Complete this exercise __________ times a day. Straight leg raises This exercise stretches the muscles that move your hips away from the front of the pelvis (hip extensors). 1. Lie on your abdomen on a firm surface. You can put a pillow under your hips if that is more comfortable. 2. Tense the muscles in your buttocks and lift your left / right leg about 4-6 inches (10-15 cm). Keep your knee straight as you lift your leg. 3. Hold this position for __________ seconds. 4. Slowly lower your leg to the starting position. 5. Let your leg relax completely after each repetition. Repeat __________ times. Complete this exercise __________ times a day. This information is not intended to replace advice given to you by your health care provider. Make sure you discuss any questions you have with your health care provider. Document Revised: 10/12/2017 Document Reviewed: 10/12/2017 [employer] Patient Education  2020 [employer] Hand Exercises Hand exercises can be helpful for almost anyone. These exercises can strengthen the hands, improve flexibility and movement, and increase blood flow to the hands. These results can make work and daily tasks easier. Hand exercises can be especially helpful for people who have joint pain from arthritis or have nerve damage from overuse (carpal tunnel syndrome). These exercises can also help people who have injured a hand. Exercises Most of these hand exercises are gentle stretching and motion exercises. It is usually safe to do them often throughout the day. Warming up your hands before exercise may help to reduce stiffness. You can do this with gentle massage or by placing your hands in warm water for 10-15 minutes. It is normal to feel some stretching, pulling,  tightness, or mild discomfort as you begin new exercises. This will gradually improve. Stop an exercise right away if you feel sudden, severe pain or your pain gets worse. Ask your health care provider which exercises are best for you. Knuckle bend or "claw" fist 1. Stand or sit with your arm, hand, and all five fingers pointed straight up. Make sure to keep your wrist straight during the exercise. 2. Gently bend your fingers down toward your palm until the tips of your fingers are touching the top of your palm. Keep your big knuckle straight and just bend the small knuckles in your fingers. 3. Hold this position for   __________ seconds. 4. Straighten (extend) your fingers back to the starting position. Repeat this exercise 5-10 times with each hand. Full finger fist 1. Stand or sit with your arm, hand, and all five fingers pointed straight up. Make sure to keep your wrist straight during the exercise. 2. Gently bend your fingers into your palm until the tips of your fingers are touching the middle of your palm. 3. Hold this position for __________ seconds. 4. Extend your fingers back to the starting position, stretching every joint fully. Repeat this exercise 5-10 times with each hand. Straight fist 1. Stand or sit with your arm, hand, and all five fingers pointed straight up. Make sure to keep your wrist straight during the exercise. 2. Gently bend your fingers at the big knuckle, where your fingers meet your hand, and the middle knuckle. Keep the knuckle at the tips of your fingers straight and try to touch the bottom of your palm. 3. Hold this position for __________ seconds. 4. Extend your fingers back to the starting position, stretching every joint fully. Repeat this exercise 5-10 times with each hand. Tabletop 1. Stand or sit with your arm, hand, and all five fingers pointed straight up. Make sure to keep your wrist straight during the exercise. 2. Gently bend your fingers at the big  knuckle, where your fingers meet your hand, as far down as you can while keeping the small knuckles in your fingers straight. Think of forming a tabletop with your fingers. 3. Hold this position for __________ seconds. 4. Extend your fingers back to the starting position, stretching every joint fully. Repeat this exercise 5-10 times with each hand. Finger spread 1. Place your hand flat on a table with your palm facing down. Make sure your wrist stays straight as you do this exercise. 2. Spread your fingers and thumb apart from each other as far as you can until you feel a gentle stretch. Hold this position for __________ seconds. 3. Bring your fingers and thumb tight together again. Hold this position for __________ seconds. Repeat this exercise 5-10 times with each hand. Making circles 1. Stand or sit with your arm, hand, and all five fingers pointed straight up. Make sure to keep your wrist straight during the exercise. 2. Make a circle by touching the tip of your thumb to the tip of your index finger. 3. Hold for __________ seconds. Then open your hand wide. 4. Repeat this motion with your thumb and each finger on your hand. Repeat this exercise 5-10 times with each hand. Thumb motion 1. Sit with your forearm resting on a table and your wrist straight. Your thumb should be facing up toward the ceiling. Keep your fingers relaxed as you move your thumb. 2. Lift your thumb up as high as you can toward the ceiling. Hold for __________ seconds. 3. Bend your thumb across your palm as far as you can, reaching the tip of your thumb for the small finger (pinkie) side of your palm. Hold for __________ seconds. Repeat this exercise 5-10 times with each hand. Grip strengthening  1. Hold a stress ball or other soft ball in the middle of your hand. 2. Slowly increase the pressure, squeezing the ball as much as you can without causing pain. Think of bringing the tips of your fingers into the middle of  your palm. All of your finger joints should bend when doing this exercise. 3. Hold your squeeze for __________ seconds, then relax. Repeat this exercise 5-10 times with each   hand. Contact a health care provider if:  Your hand pain or discomfort gets much worse when you do an exercise.  Your hand pain or discomfort does not improve within 2 hours after you exercise. If you have any of these problems, stop doing these exercises right away. Do not do them again unless your health care provider says that you can. Get help right away if:  You develop sudden, severe hand pain or swelling. If this happens, stop doing these exercises right away. Do not do them again unless your health care provider says that you can. This information is not intended to replace advice given to you by your health care provider. Make sure you discuss any questions you have with your health care provider. Document Revised: 04/14/2018 Document Reviewed: 12/23/2017 [employer] Patient Education  2020 [employer]  

## 2019-12-23 NOTE — ED Notes (Addendum)
Pt called 3x no answer  

## 2019-12-30 DIAGNOSIS — G4733 Obstructive sleep apnea (adult) (pediatric): Secondary | ICD-10-CM | POA: Diagnosis not present

## 2020-01-01 DIAGNOSIS — M25552 Pain in left hip: Secondary | ICD-10-CM | POA: Diagnosis not present

## 2020-01-01 DIAGNOSIS — M7661 Achilles tendinitis, right leg: Secondary | ICD-10-CM | POA: Diagnosis not present

## 2020-01-01 DIAGNOSIS — M25561 Pain in right knee: Secondary | ICD-10-CM | POA: Diagnosis not present

## 2020-01-01 DIAGNOSIS — M25571 Pain in right ankle and joints of right foot: Secondary | ICD-10-CM | POA: Diagnosis not present

## 2020-01-04 DIAGNOSIS — M25562 Pain in left knee: Secondary | ICD-10-CM | POA: Diagnosis not present

## 2020-01-04 DIAGNOSIS — M722 Plantar fascial fibromatosis: Secondary | ICD-10-CM | POA: Diagnosis not present

## 2020-01-04 DIAGNOSIS — M4722 Other spondylosis with radiculopathy, cervical region: Secondary | ICD-10-CM | POA: Diagnosis not present

## 2020-01-04 DIAGNOSIS — M9902 Segmental and somatic dysfunction of thoracic region: Secondary | ICD-10-CM | POA: Diagnosis not present

## 2020-01-04 DIAGNOSIS — M4724 Other spondylosis with radiculopathy, thoracic region: Secondary | ICD-10-CM | POA: Diagnosis not present

## 2020-01-04 DIAGNOSIS — M25561 Pain in right knee: Secondary | ICD-10-CM | POA: Diagnosis not present

## 2020-01-04 DIAGNOSIS — M9901 Segmental and somatic dysfunction of cervical region: Secondary | ICD-10-CM | POA: Diagnosis not present

## 2020-01-04 DIAGNOSIS — M9904 Segmental and somatic dysfunction of sacral region: Secondary | ICD-10-CM | POA: Diagnosis not present

## 2020-01-04 DIAGNOSIS — M5388 Other specified dorsopathies, sacral and sacrococcygeal region: Secondary | ICD-10-CM | POA: Diagnosis not present

## 2020-01-08 DIAGNOSIS — Z9989 Dependence on other enabling machines and devices: Secondary | ICD-10-CM | POA: Insufficient documentation

## 2020-01-08 DIAGNOSIS — I7300 Raynaud's syndrome without gangrene: Secondary | ICD-10-CM | POA: Insufficient documentation

## 2020-01-08 DIAGNOSIS — M797 Fibromyalgia: Secondary | ICD-10-CM | POA: Insufficient documentation

## 2020-01-08 DIAGNOSIS — G4733 Obstructive sleep apnea (adult) (pediatric): Secondary | ICD-10-CM | POA: Insufficient documentation

## 2020-01-08 DIAGNOSIS — F411 Generalized anxiety disorder: Secondary | ICD-10-CM | POA: Insufficient documentation

## 2020-01-08 DIAGNOSIS — D869 Sarcoidosis, unspecified: Secondary | ICD-10-CM | POA: Insufficient documentation

## 2020-01-08 DIAGNOSIS — K219 Gastro-esophageal reflux disease without esophagitis: Secondary | ICD-10-CM | POA: Insufficient documentation

## 2020-01-08 DIAGNOSIS — F32A Depression, unspecified: Secondary | ICD-10-CM | POA: Insufficient documentation

## 2020-01-08 DIAGNOSIS — E538 Deficiency of other specified B group vitamins: Secondary | ICD-10-CM | POA: Insufficient documentation

## 2020-01-08 DIAGNOSIS — G4700 Insomnia, unspecified: Secondary | ICD-10-CM | POA: Insufficient documentation

## 2020-01-08 DIAGNOSIS — E559 Vitamin D deficiency, unspecified: Secondary | ICD-10-CM | POA: Insufficient documentation

## 2020-01-08 DIAGNOSIS — M549 Dorsalgia, unspecified: Secondary | ICD-10-CM | POA: Insufficient documentation

## 2020-01-08 DIAGNOSIS — I10 Essential (primary) hypertension: Secondary | ICD-10-CM | POA: Insufficient documentation

## 2020-01-08 DIAGNOSIS — M1990 Unspecified osteoarthritis, unspecified site: Secondary | ICD-10-CM | POA: Insufficient documentation

## 2020-01-08 DIAGNOSIS — E119 Type 2 diabetes mellitus without complications: Secondary | ICD-10-CM | POA: Insufficient documentation

## 2020-01-10 ENCOUNTER — Ambulatory Visit: Payer: Medicare HMO | Admitting: Cardiology

## 2020-01-10 DIAGNOSIS — E119 Type 2 diabetes mellitus without complications: Secondary | ICD-10-CM | POA: Diagnosis not present

## 2020-01-10 DIAGNOSIS — R509 Fever, unspecified: Secondary | ICD-10-CM | POA: Diagnosis not present

## 2020-01-10 DIAGNOSIS — Z20828 Contact with and (suspected) exposure to other viral communicable diseases: Secondary | ICD-10-CM | POA: Diagnosis not present

## 2020-01-10 DIAGNOSIS — J45909 Unspecified asthma, uncomplicated: Secondary | ICD-10-CM | POA: Diagnosis not present

## 2020-01-10 DIAGNOSIS — J209 Acute bronchitis, unspecified: Secondary | ICD-10-CM | POA: Diagnosis not present

## 2020-01-15 DIAGNOSIS — M25571 Pain in right ankle and joints of right foot: Secondary | ICD-10-CM | POA: Diagnosis not present

## 2020-01-15 DIAGNOSIS — M25561 Pain in right knee: Secondary | ICD-10-CM | POA: Diagnosis not present

## 2020-01-15 DIAGNOSIS — M25552 Pain in left hip: Secondary | ICD-10-CM | POA: Diagnosis not present

## 2020-01-15 DIAGNOSIS — M7661 Achilles tendinitis, right leg: Secondary | ICD-10-CM | POA: Diagnosis not present

## 2020-01-16 DIAGNOSIS — F5104 Psychophysiologic insomnia: Secondary | ICD-10-CM | POA: Diagnosis not present

## 2020-01-16 DIAGNOSIS — E1149 Type 2 diabetes mellitus with other diabetic neurological complication: Secondary | ICD-10-CM | POA: Diagnosis not present

## 2020-01-16 DIAGNOSIS — E785 Hyperlipidemia, unspecified: Secondary | ICD-10-CM | POA: Diagnosis not present

## 2020-01-16 DIAGNOSIS — E559 Vitamin D deficiency, unspecified: Secondary | ICD-10-CM | POA: Diagnosis not present

## 2020-01-16 DIAGNOSIS — I10 Essential (primary) hypertension: Secondary | ICD-10-CM | POA: Diagnosis not present

## 2020-01-16 DIAGNOSIS — E039 Hypothyroidism, unspecified: Secondary | ICD-10-CM | POA: Diagnosis not present

## 2020-01-16 DIAGNOSIS — E1165 Type 2 diabetes mellitus with hyperglycemia: Secondary | ICD-10-CM | POA: Diagnosis not present

## 2020-01-16 DIAGNOSIS — Z79899 Other long term (current) drug therapy: Secondary | ICD-10-CM | POA: Diagnosis not present

## 2020-01-23 NOTE — Progress Notes (Signed)
Cardiology Office Note:    Date:  01/25/2020   ID:  [name], DOB 1955/04/07, MRN [id number]  PCP:  Janine Limbo, PA-C  Cardiologist:  Shirlee More, MD    Referring MD: Janine Limbo, PA-C    ASSESSMENT:    1. Palpitations   2. Mild CAD   3. Essential hypertension   4. Mixed hyperlipidemia   5. Sarcoidosis    PLAN:    In order of problems listed above:  1. Stable no recurrence continue current treatment including beta-blocker with CAD 2. Stable mild nonobstructive CAD having no angina continue treatment which includes aspirin beta-blocker and statin 3. BP at target continue current treatment 4. Good response continue her statin lipids at target 5. Stable chest x-ray shows old changes she is followed by rheumatology 6. Diabetes well controlled continue current metoprolol   Next appointment: 6 months   Medication Adjustments/Labs and Tests Ordered: Current medicines are reviewed at length with the patient today.  Concerns regarding medicines are outlined above.  No orders of the defined types were placed in this encounter.  No orders of the defined types were placed in this encounter.   Chief Complaint  Patient presents with  . Follow-up  . Tachycardia  . Coronary Artery Disease    History of Present Illness:    Melanie Meyers is a 65 y.o. female with a hx of mild CAD type 2 diabetes hypertension and hyper lipidemia.  Coronary angiography September 2019 showed right coronary artery proximal stenosis 30% proximal LAD 35%, ramus 50%.  Echocardiogram in April 2021 showed EF of 55 to 60% mild LVH mild biatrial enlargement.  3-day ZIO monitor showed rare ventricular and supraventricular ectopy.  She was last seen 07/25/2019.  Compliance with diet, lifestyle and medications: Yes  I reviewed her testing results with her.  She has had no recurrent palpitation has had no chest pain edema shortness of breath she had a recent upper respiratory infection  and was COVID-19 negative.  She has not been vaccinated as strongly encouraged her to go today for the first dose.  She hand carries a chest x-ray report from January 10, 2020 which shows prominent hila hilar enlargement consistent with a diagnosis of sarcoidosis.  Chest x-ray is done because she presented with cough and congestion.  She has CT of the chest August 2013 that showed hilar lymph node enlargement and pulmonary nodules  Left heart catheterization in September 2019 shows  Prox RCA lesion is 30% stenosed.  Prox LAD lesion is 35% stenosed.  Ost Ramus to Ramus lesion is 50% stenosed.  The left ventricular systolic function is normal.  LV end diastolic pressure is normal.  The left ventricular ejection fraction is 55-65% by visual estimate.   1. Nonobstructive CAD 2. Normal LV function 3. Normal LVEDP  Past Medical History:  Diagnosis Date  . Asthma   . B12 deficiency   . Back pain   . CAD (coronary artery disease) 11/10/2017  . Carpal tunnel syndrome of right wrist 01/15/2012  . Chronic tension-type headache, not intractable 07/10/2016  . Degenerative arthritis    degenerative arthritis of neck  . Depression   . Diabetic peripheral neuropathy ([place]) 05/17/2016  . Fibromyalgia   . Generalized anxiety disorder   . GERD (gastroesophageal reflux disease)   . HTN (hypertension) 06/20/2015  . Hyperlipidemia 06/20/2015  . Hypertension   . Hypothyroidism 06/02/2013  . Insomnia   . Major depressive disorder, recurrent episode with anxious distress ([place])   .  Obstructive sleep apnea    On CPAP  . Raynaud's disease    Per patient  . Sarcoidosis   . 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 ([place]) 02/10/2013  . Transient alteration of awareness 10/10/2015  . Type II Diabetes Mellitus without complication ([place])   . Vitamin D deficiency     Past Surgical History:  Procedure Laterality Date  . ANTERIOR CERVICAL DECOMP/DISCECTOMY FUSION N/A 10/06/2017   Procedure: ANTERIOR  CERVICAL DECOMPRESSION/DISCECTOMY FUSION C4-7;  Surgeon: Melina Schools, MD;  Location: [facility];  Service: Orthopedics;  Laterality: N/A;  4.5 hrs  . BREAST REDUCTION SURGERY  02/18/2015  . CARPAL TUNNEL RELEASE  2014  . CARPAL TUNNEL RELEASE  04/2016  . [place]  . LEFT HEART CATH AND CORONARY ANGIOGRAPHY N/A 09/29/2017   Procedure: LEFT HEART CATH AND CORONARY ANGIOGRAPHY;  Surgeon: Martinique, Peter M, MD;  Location: [facility];  Service: Cardiovascular;  Laterality: N/A;  . NASAL POLYP SURGERY  1983  . SINOSCOPY  10/09/2014  . TONSILLECTOMY  1978  . TUBAL LIGATION      Current Medications: Current Meds  Medication Sig  . albuterol (VENTOLIN HFA) 108 (90 Base) MCG/ACT inhaler Inhale 2 puffs into the lungs every 6 (six) hours as needed for wheezing or shortness of breath.   Marland Kitchen aspirin EC 81 MG tablet Take 81 mg by mouth at bedtime.   . Azelastine HCl 137 MCG/SPRAY SOLN Place 1 spray into the nose as needed.   . Biotin 10000 MCG TABS Take 10,000 mcg by mouth at bedtime.  . budesonide-formoterol (SYMBICORT) 160-4.5 MCG/ACT inhaler Inhale 1-2 puffs into the lungs 2 (two) times daily as needed.  . diclofenac Sodium (VOLTAREN) 1 % GEL Apply 2-3 application topically 4 (four) times daily.  . Insulin Glargine-Lixisenatide (SOLIQUA) 100-33 UNT-MCG/ML SOPN Inject 26 Units into the skin in the morning. 30 min before breakfast  . Insulin Pen Needle (DROPLET PEN NEEDLES) 31G X 6 MM MISC   . levothyroxine (SYNTHROID, LEVOTHROID) 25 MCG tablet Take 25 mcg by mouth daily before breakfast.  . lidocaine (LIDODERM) 5 % Place 1 patch onto the skin as needed.   Marland Kitchen LINZESS 145 MCG CAPS capsule 145 mcg as needed.  . meclizine (ANTIVERT) 12.5 MG tablet Take 1-2 tablets by mouth every 4 (four) hours as needed.  . Menthol, Topical Analgesic, (BIOFREEZE EX) Apply 1 application topically daily as needed (muscle pain).  . metFORMIN (GLUCOPHAGE) 1000 MG tablet Take 1 tablet (1,000 mg  total) by mouth 2 (two) times daily with a meal. (Patient taking differently: Take 500 mg by mouth 2 (two) times daily with a meal.)  . methocarbamol (ROBAXIN) 500 MG tablet Take 1 tablet (500 mg total) by mouth 3 (three) times daily. (Patient taking differently: Take 500 mg by mouth 2 (two) times daily.)  . metoprolol succinate (TOPROL-XL) 25 MG 24 hr tablet Take 1 tablet (25 mg total) by mouth daily.  . montelukast (SINGULAIR) 10 MG tablet Take 10 mg by mouth at bedtime.   . Multiple Vitamin (MULTIVITAMIN WITH MINERALS) TABS tablet Take 1 tablet by mouth at bedtime.  . naproxen (NAPROSYN) 500 MG tablet Take 500 mg by mouth as needed.   . nitroGLYCERIN (NITROSTAT) 0.4 MG SL tablet Place 1 tablet (0.4 mg total) under the tongue every 5 (five) minutes as needed.  Marland Kitchen olmesartan (BENICAR) 20 MG tablet Take 0.5 tablets (10 mg total) by mouth daily.  . Omega-3 Fatty Acids (FISH OIL) 1000  MG CAPS Take 2,000 mg by mouth at bedtime.  . polyethylene glycol (MIRALAX / GLYCOLAX) packet Take 17 g by mouth daily as needed for mild constipation.   . pravastatin (PRAVACHOL) 40 MG tablet Take 40 mg by mouth daily.   . pregabalin (LYRICA) 150 MG capsule Take 1 cap in the morning and 2 caps in the evening  . promethazine (PHENERGAN) 12.5 MG tablet Take 1-2 tablets by mouth every 4 (four) hours as needed.  . rizatriptan (MAXALT) 10 MG tablet Take 10 mg by mouth as needed for migraine.   . sertraline (ZOLOFT) 100 MG tablet Take 100 mg by mouth daily.  . silver sulfADIAZINE (SILVADENE) 1 % cream as needed.  . TRULANCE 3 MG TABS Take 3 mg by mouth as needed.  . zolpidem (AMBIEN) 10 MG tablet      Allergies:   Accupril [quinapril hcl], Prednisone, and Levaquin [levofloxacin]   Social History   Socioeconomic History  . Marital status: Widowed    Spouse name: Not on file  . Number of children: Not on file  . Years of education: 59  . Highest education level: High school graduate  Occupational History  . Not on  file  Tobacco Use  . Smoking status: Never Smoker  . Smokeless tobacco: Never Used  Vaping Use  . Vaping Use: Never used  Substance and Sexual Activity  . Alcohol use: No    Alcohol/week: 0.0 standard drinks  . Drug use: No  . Sexual activity: Not on file  Other Topics Concern  . Not on file  Social History Narrative  . Not on file   Social Determinants of Health   Financial Resource Strain: Not on file  Food Insecurity: Not on file  Transportation Needs: Not on file  Physical Activity: Not on file  Stress: Not on file  Social Connections: Not on file     Family History: The patient's family history includes Allergies in her daughter; Alzheimer's disease in her mother; Diabetes in her mother; Gout in her mother and sister; Heart attack in her father and sister; High Cholesterol in her father; High blood pressure in her father; Parkinson's disease in her mother; Rheum arthritis in her cousin; Thyroid disease in her daughter, sister, sister, and sister. ROS:   Please see the history of present illness.    All other systems reviewed and are negative.  EKGs/Labs/Other Studies Reviewed:    The following studies were reviewed today:    Recent Labs:  01/16/2020 recent labs PCP [facility]: Lipids at target LDL 85 cholesterol 169 triglycerides 180 HDL 54 A1c at target 5.9% Normal liver function and CBC 12/04/2019: ALT 15; TSH 1.71 12/22/2019: BUN 16; Creatinine, Ser 1.02; Hemoglobin 12.6; Platelets 245; Potassium 4.1; Sodium 137  Recent Lipid Panel    Component Value Date/Time   CHOL 222 (H) 07/25/2019 1343   TRIG 284 (H) 07/25/2019 1343   HDL 52 07/25/2019 1343   CHOLHDL 4.3 07/25/2019 1343   LDLCALC 120 (H) 07/25/2019 1343    Physical Exam:    VS:  BP 124/78   Pulse 78   Ht 5\' 3"  (1.6 m)   Wt 217 lb 6.4 oz (98.6 kg)   LMP  (LMP Unknown) Comment: tubal ligation  SpO2 96%   BMI 38.51 kg/m     Wt Readings from Last 3 Encounters:  01/25/20 217  lb 6.4 oz (98.6 kg)  12/22/19 217 lb 9.6 oz (98.7 kg)  12/04/19 218 lb 3.2 oz (99 kg)  GEN:  Well nourished, well developed in no acute distress HEENT: Normal NECK: No JVD; No carotid bruits LYMPHATICS: No lymphadenopathy CARDIAC: RRR, no murmurs, rubs, gallops RESPIRATORY:  Clear to auscultation without rales, wheezing or rhonchi  ABDOMEN: Soft, non-tender, non-distended MUSCULOSKELETAL:  No edema; No deformity  SKIN: Warm and dry NEUROLOGIC:  Alert and oriented x 3 PSYCHIATRIC:  Normal affect    Signed, Shirlee More, MD  01/25/2020 10:38 AM    [place]

## 2020-01-25 ENCOUNTER — Ambulatory Visit (INDEPENDENT_AMBULATORY_CARE_PROVIDER_SITE_OTHER): Payer: Medicare HMO | Admitting: Cardiology

## 2020-01-25 ENCOUNTER — Ambulatory Visit: Payer: Medicare HMO | Admitting: Cardiology

## 2020-01-25 ENCOUNTER — Encounter: Payer: Self-pay | Admitting: Cardiology

## 2020-01-25 ENCOUNTER — Other Ambulatory Visit: Payer: Self-pay

## 2020-01-25 VITALS — BP 124/78 | HR 78 | Ht 63.0 in | Wt 217.4 lb

## 2020-01-25 DIAGNOSIS — I10 Essential (primary) hypertension: Secondary | ICD-10-CM | POA: Diagnosis not present

## 2020-01-25 DIAGNOSIS — D869 Sarcoidosis, unspecified: Secondary | ICD-10-CM

## 2020-01-25 DIAGNOSIS — E782 Mixed hyperlipidemia: Secondary | ICD-10-CM | POA: Diagnosis not present

## 2020-01-25 DIAGNOSIS — I2510 Atherosclerotic heart disease of native coronary artery without angina pectoris: Secondary | ICD-10-CM

## 2020-01-25 DIAGNOSIS — R002 Palpitations: Secondary | ICD-10-CM

## 2020-01-25 NOTE — Patient Instructions (Signed)

## 2020-01-26 ENCOUNTER — Ambulatory Visit: Payer: Medicare HMO | Admitting: Cardiology

## 2020-01-30 DIAGNOSIS — G4733 Obstructive sleep apnea (adult) (pediatric): Secondary | ICD-10-CM | POA: Diagnosis not present

## 2020-02-02 DIAGNOSIS — G4733 Obstructive sleep apnea (adult) (pediatric): Secondary | ICD-10-CM | POA: Diagnosis not present

## 2020-02-06 DIAGNOSIS — G4733 Obstructive sleep apnea (adult) (pediatric): Secondary | ICD-10-CM | POA: Diagnosis not present

## 2020-02-06 DIAGNOSIS — J309 Allergic rhinitis, unspecified: Secondary | ICD-10-CM | POA: Diagnosis not present

## 2020-02-06 DIAGNOSIS — R918 Other nonspecific abnormal finding of lung field: Secondary | ICD-10-CM | POA: Diagnosis not present

## 2020-02-06 DIAGNOSIS — D869 Sarcoidosis, unspecified: Secondary | ICD-10-CM | POA: Diagnosis not present

## 2020-02-07 DIAGNOSIS — M4301 Spondylolysis, occipito-atlanto-axial region: Secondary | ICD-10-CM | POA: Diagnosis not present

## 2020-02-07 DIAGNOSIS — M9904 Segmental and somatic dysfunction of sacral region: Secondary | ICD-10-CM | POA: Diagnosis not present

## 2020-02-07 DIAGNOSIS — M9902 Segmental and somatic dysfunction of thoracic region: Secondary | ICD-10-CM | POA: Diagnosis not present

## 2020-02-07 DIAGNOSIS — M9901 Segmental and somatic dysfunction of cervical region: Secondary | ICD-10-CM | POA: Diagnosis not present

## 2020-02-07 DIAGNOSIS — M4303 Spondylolysis, cervicothoracic region: Secondary | ICD-10-CM | POA: Diagnosis not present

## 2020-02-07 DIAGNOSIS — M5388 Other specified dorsopathies, sacral and sacrococcygeal region: Secondary | ICD-10-CM | POA: Diagnosis not present

## 2020-02-16 DIAGNOSIS — G4733 Obstructive sleep apnea (adult) (pediatric): Secondary | ICD-10-CM | POA: Diagnosis not present

## 2020-02-21 DIAGNOSIS — Z1152 Encounter for screening for COVID-19: Secondary | ICD-10-CM | POA: Diagnosis not present

## 2020-02-21 DIAGNOSIS — D869 Sarcoidosis, unspecified: Secondary | ICD-10-CM | POA: Diagnosis not present

## 2020-02-21 DIAGNOSIS — J309 Allergic rhinitis, unspecified: Secondary | ICD-10-CM | POA: Diagnosis not present

## 2020-02-21 DIAGNOSIS — R918 Other nonspecific abnormal finding of lung field: Secondary | ICD-10-CM | POA: Diagnosis not present

## 2020-02-21 DIAGNOSIS — G4733 Obstructive sleep apnea (adult) (pediatric): Secondary | ICD-10-CM | POA: Diagnosis not present

## 2020-03-01 DIAGNOSIS — G4733 Obstructive sleep apnea (adult) (pediatric): Secondary | ICD-10-CM | POA: Diagnosis not present

## 2020-03-05 ENCOUNTER — Ambulatory Visit: Payer: Medicare HMO | Admitting: Sports Medicine

## 2020-03-05 DIAGNOSIS — M5388 Other specified dorsopathies, sacral and sacrococcygeal region: Secondary | ICD-10-CM | POA: Diagnosis not present

## 2020-03-05 DIAGNOSIS — M9902 Segmental and somatic dysfunction of thoracic region: Secondary | ICD-10-CM | POA: Diagnosis not present

## 2020-03-05 DIAGNOSIS — M4303 Spondylolysis, cervicothoracic region: Secondary | ICD-10-CM | POA: Diagnosis not present

## 2020-03-05 DIAGNOSIS — M9904 Segmental and somatic dysfunction of sacral region: Secondary | ICD-10-CM | POA: Diagnosis not present

## 2020-03-05 DIAGNOSIS — M4301 Spondylolysis, occipito-atlanto-axial region: Secondary | ICD-10-CM | POA: Diagnosis not present

## 2020-03-05 DIAGNOSIS — M9901 Segmental and somatic dysfunction of cervical region: Secondary | ICD-10-CM | POA: Diagnosis not present

## 2020-03-08 ENCOUNTER — Encounter: Payer: Self-pay | Admitting: Sports Medicine

## 2020-03-08 ENCOUNTER — Other Ambulatory Visit: Payer: Self-pay

## 2020-03-08 ENCOUNTER — Ambulatory Visit (INDEPENDENT_AMBULATORY_CARE_PROVIDER_SITE_OTHER): Payer: Medicare HMO | Admitting: Sports Medicine

## 2020-03-08 DIAGNOSIS — E669 Obesity, unspecified: Secondary | ICD-10-CM

## 2020-03-08 DIAGNOSIS — M79609 Pain in unspecified limb: Secondary | ICD-10-CM

## 2020-03-08 DIAGNOSIS — E1169 Type 2 diabetes mellitus with other specified complication: Secondary | ICD-10-CM

## 2020-03-08 DIAGNOSIS — M2141 Flat foot [pes planus] (acquired), right foot: Secondary | ICD-10-CM

## 2020-03-08 DIAGNOSIS — M2142 Flat foot [pes planus] (acquired), left foot: Secondary | ICD-10-CM

## 2020-03-08 DIAGNOSIS — B351 Tinea unguium: Secondary | ICD-10-CM

## 2020-03-08 DIAGNOSIS — M7661 Achilles tendinitis, right leg: Secondary | ICD-10-CM

## 2020-03-08 NOTE — Progress Notes (Signed)
Subjective: Melanie Meyers is a 65 y.o. female patient with history of diabetes who presents to office today complaining of pain to toenails unable to trim and reports that she still has pain in her right foot now it is sore at the back of the right heel.  Patient also reports that she is supposed to have a right knee surgery and she mentioned it to the PA and they said that that may be after her knee surgery her gait would change and this would help her foot pain.  Fasting blood sugar this morning was not recorded but has been good. A1c around 6 PCP [facility] in January Patient Active Problem List   Diagnosis Date Noted  . Vitamin D deficiency   . Type II Diabetes Mellitus without complication ([place])   . Sarcoidosis   . Raynaud's disease   . Obstructive sleep apnea   . Insomnia   . Hypertension   . GERD (gastroesophageal reflux disease)   . Generalized anxiety disorder   . Fibromyalgia   . Depression   . Degenerative arthritis   . Back pain   . B12 deficiency   . Acute tear of lateral meniscus of right knee with peripheral detachment 10/13/2018  . Pain in right knee 09/21/2018  . Major depressive disorder, recurrent episode with anxious distress ([place])   . Acquired hallux rigidus of right foot 04/13/2018  . Osteoarthritis of knee 03/28/2018  . Pes anserinus bursitis of right knee 03/28/2018  . CAD (coronary artery disease) 11/10/2017  . Osteoarthritis of finger of right hand 02/10/2017  . Chronic tension-type headache, not intractable 07/10/2016  . Diabetic peripheral neuropathy ([place]) 05/17/2016  . Transient alteration of awareness 10/10/2015  . HTN (hypertension) 06/20/2015  . Hyperlipidemia 06/20/2015  . Morbid obesity ([place]) 06/20/2015  . Metatarsal deformity 05/30/2014  . Metatarsalgia 05/30/2014  . Hand paresthesia 06/27/2013  . Hypothyroidism 06/02/2013  . Type 2 diabetes mellitus ([place]) 06/02/2013  . 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 ([place]) 02/10/2013   . Nerve root pain 01/27/2013  . Narrowing of intervertebral disc space 01/27/2013  . Myofascial pain 01/27/2013  . Cervical spine pain 11/24/2012  . Cervical osteoarthritis 11/24/2012  . Carpal tunnel syndrome 01/15/2012  . Carpal tunnel syndrome of right wrist 01/15/2012  . Asthma 01/20/2008   Current Outpatient Medications on File Prior to Visit  Medication Sig Dispense Refill  . albuterol (VENTOLIN HFA) 108 (90 Base) MCG/ACT inhaler Inhale 2 puffs into the lungs every 6 (six) hours as needed for wheezing or shortness of breath.     Marland Kitchen aspirin EC 81 MG tablet Take 81 mg by mouth at bedtime.     . Azelastine HCl 137 MCG/SPRAY SOLN Place 1 spray into the nose as needed.     . Biotin 10000 MCG TABS Take 10,000 mcg by mouth at bedtime.    . budesonide-formoterol (SYMBICORT) 160-4.5 MCG/ACT inhaler Inhale 1-2 puffs into the lungs 2 (two) times daily as needed.    . diclofenac Sodium (VOLTAREN) 1 % GEL Apply 2-3 application topically 4 (four) times daily.    . Insulin Glargine-Lixisenatide (SOLIQUA) 100-33 UNT-MCG/ML SOPN Inject 26 Units into the skin in the morning. 30 min before breakfast    . Insulin Pen Needle (DROPLET PEN NEEDLES) 31G X 6 MM MISC     . levothyroxine (SYNTHROID, LEVOTHROID) 25 MCG tablet Take 25 mcg by mouth daily before breakfast.    . lidocaine (LIDODERM) 5 % Place 1 patch onto the skin as needed.     Marland Kitchen  LINZESS 145 MCG CAPS capsule 145 mcg as needed.    . meclizine (ANTIVERT) 12.5 MG tablet Take 1-2 tablets by mouth every 4 (four) hours as needed.    . Menthol, Topical Analgesic, (BIOFREEZE EX) Apply 1 application topically daily as needed (muscle pain).    . metFORMIN (GLUCOPHAGE) 1000 MG tablet Take 1 tablet (1,000 mg total) by mouth 2 (two) times daily with a meal. (Patient taking differently: Take 500 mg by mouth 2 (two) times daily with a meal.)    . methocarbamol (ROBAXIN) 500 MG tablet Take 1 tablet (500 mg total) by mouth 3 (three) times daily. (Patient taking  differently: Take 500 mg by mouth 2 (two) times daily.) 30 tablet 0  . metoprolol succinate (TOPROL-XL) 25 MG 24 hr tablet Take 1 tablet (25 mg total) by mouth daily. 90 tablet 2  . montelukast (SINGULAIR) 10 MG tablet Take 10 mg by mouth at bedtime.     . Multiple Vitamin (MULTIVITAMIN WITH MINERALS) TABS tablet Take 1 tablet by mouth at bedtime.    . nitroGLYCERIN (NITROSTAT) 0.4 MG SL tablet Place 1 tablet (0.4 mg total) under the tongue every 5 (five) minutes as needed. 25 tablet 11  . olmesartan (BENICAR) 20 MG tablet Take 0.5 tablets (10 mg total) by mouth daily. 45 tablet 1  . Omega-3 Fatty Acids (FISH OIL) 1000 MG CAPS Take 2,000 mg by mouth at bedtime.    . polyethylene glycol (MIRALAX / GLYCOLAX) packet Take 17 g by mouth daily as needed for mild constipation.     . pravastatin (PRAVACHOL) 40 MG tablet Take 40 mg by mouth daily.     . pregabalin (LYRICA) 150 MG capsule Take 1 cap in the morning and 2 caps in the evening 270 capsule 3  . promethazine (PHENERGAN) 12.5 MG tablet Take 1-2 tablets by mouth every 4 (four) hours as needed.    . rizatriptan (MAXALT) 10 MG tablet Take 10 mg by mouth as needed for migraine.     . sertraline (ZOLOFT) 100 MG tablet Take 100 mg by mouth daily.    . silver sulfADIAZINE (SILVADENE) 1 % cream as needed.    . TRULANCE 3 MG TABS Take 3 mg by mouth as needed.    . zolpidem (AMBIEN) 10 MG tablet      No current facility-administered medications on file prior to visit.   Allergies  Allergen Reactions  . Accupril [Quinapril Hcl] Cough  . Prednisone Other (See Comments)    Has to be cautious due to Blood Sugar  . Levaquin [Levofloxacin] Rash    Recent Results (from the past 2160 hour(s))  CBC     Status: None   Collection Time: 12/22/19  9:13 PM  Result Value Ref Range   WBC 8.0 4.0 - 10.5 K/uL   RBC 4.58 3.87 - 5.11 MIL/uL   Hemoglobin 12.6 12.0 - 15.0 g/dL   HCT 40.1 36.0 - 46.0 %   MCV 87.6 80.0 - 100.0 fL   MCH 27.5 26.0 - 34.0 pg   MCHC  31.4 30.0 - 36.0 g/dL   RDW 15.4 11.5 - 15.5 %   Platelets 245 150 - 400 K/uL   nRBC 0.0 0.0 - 0.2 %    Comment: Performed at [facility], [place] [address] [place],  [address]  Basic metabolic panel     Status: Abnormal   Collection Time: 12/22/19  9:13 PM  Result Value Ref Range   Sodium 137 135 - 145 mmol/L  Potassium 4.1 3.5 - 5.1 mmol/L   Chloride 104 98 - 111 mmol/L   CO2 24 22 - 32 mmol/L   Glucose, Bld 124 (H) 70 - 99 mg/dL    Comment: Glucose reference range applies only to samples taken after fasting for at least 8 hours.   BUN 16 8 - 23 mg/dL   Creatinine, Ser 1.02 (H) 0.44 - 1.00 mg/dL   Calcium 9.3 8.9 - 10.3 mg/dL   GFR, Estimated >60 >60 mL/min    Comment: (NOTE) Calculated using the CKD-EPI Creatinine Equation (2021)    Anion gap 9 5 - 15    Comment: Performed at [facility] [address] [place], [place] [address]    Objective: General: Patient is awake, alert, and oriented x 3 and in no acute distress.  Integument: Skin is warm, dry and supple bilateral. Nails are mildly elongated thickened and dystrophic with subungual debris, consistent with onychomycosis, 1-5 bilateral. No signs of infection. No open lesions or preulcerative lesions present bilateral. Remaining integument unremarkable.  Vasculature:  Dorsalis Pedis pulse 1/4 bilateral. Posterior Tibial pulse  1/4 bilateral. Capillary fill time <3 sec 1-5 bilateral. Positive hair growth to the level of the digits.Temperature gradient within normal limits. No varicosities present bilateral. No edema present bilateral.   Neurology: Gross sensation present via light touch.  Musculoskeletal: There is mild tenderness palpation at the insertion of the Achilles on the right.  With palpation to the plantar fascia on the right.  Muscular strength 5/5 in all lower extremity muscular groups bilateral without pain on range of motion except with chronic pain is on the right, no tenderness with calf  compression bilateral.  Assessment and Plan: Problem List Items Addressed This Visit   None   Visit Diagnoses    Pain due to onychomycosis of nail    -  Primary   Diabetes mellitus type 2 in obese (HCC)       Achilles tendinitis of right lower extremity       Pes planus of both feet          -Examined patient. -Mechanically debrided nails x10 using a sterile nail nipper without incident -Dispensed heel lifts for patient to use as instructed -Advised ice rest elevation for Achilles pain -Continue with good supportive shoes daily for foot type and may try over-the-counter support sleeve -Return in 3 months for routine foot care and to return when called for diabetic shoes -Patient advised to call the office if any problems or questions arise in the meantime.  Landis Martins, DPM

## 2020-03-25 DIAGNOSIS — R918 Other nonspecific abnormal finding of lung field: Secondary | ICD-10-CM | POA: Diagnosis not present

## 2020-03-25 DIAGNOSIS — I2510 Atherosclerotic heart disease of native coronary artery without angina pectoris: Secondary | ICD-10-CM | POA: Diagnosis not present

## 2020-03-25 DIAGNOSIS — I358 Other nonrheumatic aortic valve disorders: Secondary | ICD-10-CM | POA: Diagnosis not present

## 2020-03-25 DIAGNOSIS — I700 Atherosclerosis of aorta: Secondary | ICD-10-CM | POA: Diagnosis not present

## 2020-03-26 DIAGNOSIS — M1711 Unilateral primary osteoarthritis, right knee: Secondary | ICD-10-CM | POA: Diagnosis not present

## 2020-03-26 DIAGNOSIS — G8929 Other chronic pain: Secondary | ICD-10-CM | POA: Insufficient documentation

## 2020-03-28 DIAGNOSIS — M1711 Unilateral primary osteoarthritis, right knee: Secondary | ICD-10-CM | POA: Diagnosis not present

## 2020-03-28 DIAGNOSIS — M25561 Pain in right knee: Secondary | ICD-10-CM | POA: Diagnosis not present

## 2020-03-29 DIAGNOSIS — G4733 Obstructive sleep apnea (adult) (pediatric): Secondary | ICD-10-CM | POA: Diagnosis not present

## 2020-04-05 DIAGNOSIS — M9901 Segmental and somatic dysfunction of cervical region: Secondary | ICD-10-CM | POA: Diagnosis not present

## 2020-04-05 DIAGNOSIS — M5388 Other specified dorsopathies, sacral and sacrococcygeal region: Secondary | ICD-10-CM | POA: Diagnosis not present

## 2020-04-05 DIAGNOSIS — M4303 Spondylolysis, cervicothoracic region: Secondary | ICD-10-CM | POA: Diagnosis not present

## 2020-04-05 DIAGNOSIS — M9902 Segmental and somatic dysfunction of thoracic region: Secondary | ICD-10-CM | POA: Diagnosis not present

## 2020-04-05 DIAGNOSIS — M9904 Segmental and somatic dysfunction of sacral region: Secondary | ICD-10-CM | POA: Diagnosis not present

## 2020-04-05 DIAGNOSIS — M4301 Spondylolysis, occipito-atlanto-axial region: Secondary | ICD-10-CM | POA: Diagnosis not present

## 2020-04-10 DIAGNOSIS — R918 Other nonspecific abnormal finding of lung field: Secondary | ICD-10-CM | POA: Diagnosis not present

## 2020-04-10 DIAGNOSIS — J309 Allergic rhinitis, unspecified: Secondary | ICD-10-CM | POA: Diagnosis not present

## 2020-04-10 DIAGNOSIS — D869 Sarcoidosis, unspecified: Secondary | ICD-10-CM | POA: Diagnosis not present

## 2020-04-10 DIAGNOSIS — G4733 Obstructive sleep apnea (adult) (pediatric): Secondary | ICD-10-CM | POA: Diagnosis not present

## 2020-04-11 DIAGNOSIS — Z9013 Acquired absence of bilateral breasts and nipples: Secondary | ICD-10-CM | POA: Diagnosis not present

## 2020-04-12 ENCOUNTER — Ambulatory Visit (INDEPENDENT_AMBULATORY_CARE_PROVIDER_SITE_OTHER): Payer: Medicare HMO | Admitting: Sports Medicine

## 2020-04-12 ENCOUNTER — Other Ambulatory Visit: Payer: Self-pay

## 2020-04-12 DIAGNOSIS — E1169 Type 2 diabetes mellitus with other specified complication: Secondary | ICD-10-CM

## 2020-04-12 DIAGNOSIS — M2141 Flat foot [pes planus] (acquired), right foot: Secondary | ICD-10-CM

## 2020-04-12 DIAGNOSIS — E669 Obesity, unspecified: Secondary | ICD-10-CM

## 2020-04-12 DIAGNOSIS — M2142 Flat foot [pes planus] (acquired), left foot: Secondary | ICD-10-CM

## 2020-04-12 NOTE — Progress Notes (Signed)
Patient presented for foam casting for 3 pair custom diabetic shoe inserts. Patient is measured with a [employer] Device to be a size 8 x-wide  Diabetic shoes are chosen from the [employer]. The shoes chosen are [id number]  The patient will be contacted when the shoes and inserts are ready to be picked up.

## 2020-04-18 ENCOUNTER — Other Ambulatory Visit: Payer: Self-pay | Admitting: Cardiology

## 2020-04-23 DIAGNOSIS — M9902 Segmental and somatic dysfunction of thoracic region: Secondary | ICD-10-CM | POA: Diagnosis not present

## 2020-04-23 DIAGNOSIS — E785 Hyperlipidemia, unspecified: Secondary | ICD-10-CM | POA: Diagnosis not present

## 2020-04-23 DIAGNOSIS — E039 Hypothyroidism, unspecified: Secondary | ICD-10-CM | POA: Diagnosis not present

## 2020-04-23 DIAGNOSIS — M4303 Spondylolysis, cervicothoracic region: Secondary | ICD-10-CM | POA: Diagnosis not present

## 2020-04-23 DIAGNOSIS — M4301 Spondylolysis, occipito-atlanto-axial region: Secondary | ICD-10-CM | POA: Diagnosis not present

## 2020-04-23 DIAGNOSIS — I700 Atherosclerosis of aorta: Secondary | ICD-10-CM | POA: Diagnosis not present

## 2020-04-23 DIAGNOSIS — D860 Sarcoidosis of lung: Secondary | ICD-10-CM | POA: Diagnosis not present

## 2020-04-23 DIAGNOSIS — E1165 Type 2 diabetes mellitus with hyperglycemia: Secondary | ICD-10-CM | POA: Diagnosis not present

## 2020-04-23 DIAGNOSIS — I10 Essential (primary) hypertension: Secondary | ICD-10-CM | POA: Diagnosis not present

## 2020-04-23 DIAGNOSIS — Z79899 Other long term (current) drug therapy: Secondary | ICD-10-CM | POA: Diagnosis not present

## 2020-04-23 DIAGNOSIS — M9904 Segmental and somatic dysfunction of sacral region: Secondary | ICD-10-CM | POA: Diagnosis not present

## 2020-04-23 DIAGNOSIS — E559 Vitamin D deficiency, unspecified: Secondary | ICD-10-CM | POA: Diagnosis not present

## 2020-04-23 DIAGNOSIS — E1149 Type 2 diabetes mellitus with other diabetic neurological complication: Secondary | ICD-10-CM | POA: Diagnosis not present

## 2020-04-23 DIAGNOSIS — M5388 Other specified dorsopathies, sacral and sacrococcygeal region: Secondary | ICD-10-CM | POA: Diagnosis not present

## 2020-04-23 DIAGNOSIS — F339 Major depressive disorder, recurrent, unspecified: Secondary | ICD-10-CM | POA: Diagnosis not present

## 2020-04-23 DIAGNOSIS — M9901 Segmental and somatic dysfunction of cervical region: Secondary | ICD-10-CM | POA: Diagnosis not present

## 2020-04-29 DIAGNOSIS — G4733 Obstructive sleep apnea (adult) (pediatric): Secondary | ICD-10-CM | POA: Diagnosis not present

## 2020-04-29 DIAGNOSIS — M25571 Pain in right ankle and joints of right foot: Secondary | ICD-10-CM | POA: Diagnosis not present

## 2020-05-02 DIAGNOSIS — G4733 Obstructive sleep apnea (adult) (pediatric): Secondary | ICD-10-CM | POA: Diagnosis not present

## 2020-05-09 DIAGNOSIS — R32 Unspecified urinary incontinence: Secondary | ICD-10-CM | POA: Diagnosis not present

## 2020-05-10 DIAGNOSIS — M5388 Other specified dorsopathies, sacral and sacrococcygeal region: Secondary | ICD-10-CM | POA: Diagnosis not present

## 2020-05-10 DIAGNOSIS — M9904 Segmental and somatic dysfunction of sacral region: Secondary | ICD-10-CM | POA: Diagnosis not present

## 2020-05-10 DIAGNOSIS — M4304 Spondylolysis, thoracic region: Secondary | ICD-10-CM | POA: Diagnosis not present

## 2020-05-10 DIAGNOSIS — M9901 Segmental and somatic dysfunction of cervical region: Secondary | ICD-10-CM | POA: Diagnosis not present

## 2020-05-10 DIAGNOSIS — M47893 Other spondylosis, cervicothoracic region: Secondary | ICD-10-CM | POA: Diagnosis not present

## 2020-05-10 DIAGNOSIS — M9902 Segmental and somatic dysfunction of thoracic region: Secondary | ICD-10-CM | POA: Diagnosis not present

## 2020-05-16 DIAGNOSIS — M25562 Pain in left knee: Secondary | ICD-10-CM | POA: Diagnosis not present

## 2020-05-16 DIAGNOSIS — M1712 Unilateral primary osteoarthritis, left knee: Secondary | ICD-10-CM | POA: Diagnosis not present

## 2020-05-22 DIAGNOSIS — E785 Hyperlipidemia, unspecified: Secondary | ICD-10-CM | POA: Diagnosis not present

## 2020-05-22 DIAGNOSIS — I739 Peripheral vascular disease, unspecified: Secondary | ICD-10-CM | POA: Diagnosis not present

## 2020-05-22 DIAGNOSIS — E1151 Type 2 diabetes mellitus with diabetic peripheral angiopathy without gangrene: Secondary | ICD-10-CM | POA: Diagnosis not present

## 2020-05-22 DIAGNOSIS — E039 Hypothyroidism, unspecified: Secondary | ICD-10-CM | POA: Diagnosis not present

## 2020-05-22 DIAGNOSIS — E1149 Type 2 diabetes mellitus with other diabetic neurological complication: Secondary | ICD-10-CM | POA: Diagnosis not present

## 2020-05-22 DIAGNOSIS — I10 Essential (primary) hypertension: Secondary | ICD-10-CM | POA: Diagnosis not present

## 2020-05-22 DIAGNOSIS — Z6839 Body mass index (BMI) 39.0-39.9, adult: Secondary | ICD-10-CM | POA: Diagnosis not present

## 2020-05-22 DIAGNOSIS — E1142 Type 2 diabetes mellitus with diabetic polyneuropathy: Secondary | ICD-10-CM | POA: Diagnosis not present

## 2020-05-22 DIAGNOSIS — R635 Abnormal weight gain: Secondary | ICD-10-CM | POA: Diagnosis not present

## 2020-05-22 DIAGNOSIS — Z6841 Body Mass Index (BMI) 40.0 and over, adult: Secondary | ICD-10-CM | POA: Diagnosis not present

## 2020-05-28 DIAGNOSIS — M5388 Other specified dorsopathies, sacral and sacrococcygeal region: Secondary | ICD-10-CM | POA: Diagnosis not present

## 2020-05-28 DIAGNOSIS — M9902 Segmental and somatic dysfunction of thoracic region: Secondary | ICD-10-CM | POA: Diagnosis not present

## 2020-05-28 DIAGNOSIS — M47893 Other spondylosis, cervicothoracic region: Secondary | ICD-10-CM | POA: Diagnosis not present

## 2020-05-28 DIAGNOSIS — M4304 Spondylolysis, thoracic region: Secondary | ICD-10-CM | POA: Diagnosis not present

## 2020-05-28 DIAGNOSIS — M9901 Segmental and somatic dysfunction of cervical region: Secondary | ICD-10-CM | POA: Diagnosis not present

## 2020-05-28 DIAGNOSIS — M9904 Segmental and somatic dysfunction of sacral region: Secondary | ICD-10-CM | POA: Diagnosis not present

## 2020-05-29 DIAGNOSIS — G4733 Obstructive sleep apnea (adult) (pediatric): Secondary | ICD-10-CM | POA: Diagnosis not present

## 2020-05-30 DIAGNOSIS — Z6841 Body Mass Index (BMI) 40.0 and over, adult: Secondary | ICD-10-CM | POA: Diagnosis not present

## 2020-05-30 DIAGNOSIS — M25571 Pain in right ankle and joints of right foot: Secondary | ICD-10-CM | POA: Diagnosis not present

## 2020-06-04 ENCOUNTER — Other Ambulatory Visit: Payer: Self-pay

## 2020-06-04 ENCOUNTER — Encounter: Payer: Self-pay | Admitting: Sports Medicine

## 2020-06-04 ENCOUNTER — Other Ambulatory Visit: Payer: Self-pay | Admitting: Sports Medicine

## 2020-06-04 ENCOUNTER — Ambulatory Visit (INDEPENDENT_AMBULATORY_CARE_PROVIDER_SITE_OTHER): Payer: Medicare HMO

## 2020-06-04 ENCOUNTER — Ambulatory Visit (INDEPENDENT_AMBULATORY_CARE_PROVIDER_SITE_OTHER): Payer: Medicare HMO | Admitting: Sports Medicine

## 2020-06-04 DIAGNOSIS — S86011A Strain of right Achilles tendon, initial encounter: Secondary | ICD-10-CM

## 2020-06-04 DIAGNOSIS — M25571 Pain in right ankle and joints of right foot: Secondary | ICD-10-CM

## 2020-06-04 DIAGNOSIS — M25371 Other instability, right ankle: Secondary | ICD-10-CM

## 2020-06-04 DIAGNOSIS — M722 Plantar fascial fibromatosis: Secondary | ICD-10-CM

## 2020-06-04 DIAGNOSIS — M79671 Pain in right foot: Secondary | ICD-10-CM

## 2020-06-04 NOTE — Progress Notes (Signed)
Subjective: Melanie Meyers is a 65 y.o. female patient who presents to office for evaluation of Right heel pain. Patient complains of progressive pain after falling on her door jam last week on 5/24 while saying good bye to her son. Reports that pain is 8/10 at back of heel and foot feels disconnected and states that she has some issues with balance. Went to PCP who said maybe it was sprained.  Patient denies any other pedal complaints.   Patient Active Problem List   Diagnosis Date Noted  . Vitamin D deficiency   . Type II Diabetes Mellitus without complication ([place])   . Sarcoidosis   . Raynaud's disease   . Obstructive sleep apnea   . Insomnia   . Hypertension   . GERD (gastroesophageal reflux disease)   . Generalized anxiety disorder   . Fibromyalgia   . Depression   . Degenerative arthritis   . Back pain   . B12 deficiency   . Acute tear of lateral meniscus of right knee with peripheral detachment 10/13/2018  . Pain in right knee 09/21/2018  . Major depressive disorder, recurrent episode with anxious distress ([place])   . Acquired hallux rigidus of right foot 04/13/2018  . Osteoarthritis of knee 03/28/2018  . Pes anserinus bursitis of right knee 03/28/2018  . CAD (coronary artery disease) 11/10/2017  . Osteoarthritis of finger of right hand 02/10/2017  . Chronic tension-type headache, not intractable 07/10/2016  . Diabetic peripheral neuropathy ([place]) 05/17/2016  . Transient alteration of awareness 10/10/2015  . HTN (hypertension) 06/20/2015  . Hyperlipidemia 06/20/2015  . Morbid obesity ([place]) 06/20/2015  . Metatarsal deformity 05/30/2014  . Metatarsalgia 05/30/2014  . Hand paresthesia 06/27/2013  . Hypothyroidism 06/02/2013  . Type 2 diabetes mellitus ([place]) 06/02/2013  . 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 ([place]) 02/10/2013  . Nerve root pain 01/27/2013  . Narrowing of intervertebral disc space 01/27/2013  . Myofascial pain 01/27/2013  . Cervical  spine pain 11/24/2012  . Cervical osteoarthritis 11/24/2012  . Carpal tunnel syndrome 01/15/2012  . Carpal tunnel syndrome of right wrist 01/15/2012  . Asthma 01/20/2008    Current Outpatient Medications on File Prior to Visit  Medication Sig Dispense Refill  . albuterol (VENTOLIN HFA) 108 (90 Base) MCG/ACT inhaler Inhale 2 puffs into the lungs every 6 (six) hours as needed for wheezing or shortness of breath.     Marland Kitchen aspirin EC 81 MG tablet Take 81 mg by mouth at bedtime.     . Azelastine HCl 137 MCG/SPRAY SOLN Place 1 spray into the nose as needed.     . Biotin 10000 MCG TABS Take 10,000 mcg by mouth at bedtime.    . budesonide-formoterol (SYMBICORT) 160-4.5 MCG/ACT inhaler Inhale 1-2 puffs into the lungs 2 (two) times daily as needed.    . diclofenac Sodium (VOLTAREN) 1 % GEL Apply 2-3 application topically 4 (four) times daily.    . Insulin Glargine-Lixisenatide (SOLIQUA) 100-33 UNT-MCG/ML SOPN Inject 26 Units into the skin in the morning. 30 min before breakfast    . Insulin Pen Needle (DROPLET PEN NEEDLES) 31G X 6 MM MISC     . levothyroxine (SYNTHROID, LEVOTHROID) 25 MCG tablet Take 25 mcg by mouth daily before breakfast.    . lidocaine (LIDODERM) 5 % Place 1 patch onto the skin as needed.     Marland Kitchen LINZESS 145 MCG CAPS capsule 145 mcg as needed.    . meclizine (ANTIVERT) 12.5 MG tablet Take 1-2 tablets by mouth every  4 (four) hours as needed.    . Menthol, Topical Analgesic, (BIOFREEZE EX) Apply 1 application topically daily as needed (muscle pain).    . metFORMIN (GLUCOPHAGE) 1000 MG tablet Take 1 tablet (1,000 mg total) by mouth 2 (two) times daily with a meal. (Patient taking differently: Take 500 mg by mouth 2 (two) times daily with a meal.)    . methocarbamol (ROBAXIN) 500 MG tablet Take 1 tablet (500 mg total) by mouth 3 (three) times daily. (Patient taking differently: Take 500 mg by mouth 2 (two) times daily.) 30 tablet 0  . metoprolol succinate (TOPROL-XL) 25 MG 24 hr tablet Take 1  tablet (25 mg total) by mouth daily. 90 tablet 2  . montelukast (SINGULAIR) 10 MG tablet Take 10 mg by mouth at bedtime.     . Multiple Vitamin (MULTIVITAMIN WITH MINERALS) TABS tablet Take 1 tablet by mouth at bedtime.    . nitroGLYCERIN (NITROSTAT) 0.4 MG SL tablet Place 1 tablet (0.4 mg total) under the tongue every 5 (five) minutes as needed. 25 tablet 11  . olmesartan (BENICAR) 20 MG tablet TAKE 1/2 TABLET EVERY DAY 45 tablet 1  . Omega-3 Fatty Acids (FISH OIL) 1000 MG CAPS Take 2,000 mg by mouth at bedtime.    . polyethylene glycol (MIRALAX / GLYCOLAX) packet Take 17 g by mouth daily as needed for mild constipation.     . pravastatin (PRAVACHOL) 40 MG tablet Take 40 mg by mouth daily.     . pregabalin (LYRICA) 150 MG capsule Take 1 cap in the morning and 2 caps in the evening 270 capsule 3  . promethazine (PHENERGAN) 12.5 MG tablet Take 1-2 tablets by mouth every 4 (four) hours as needed.    . rizatriptan (MAXALT) 10 MG tablet Take 10 mg by mouth as needed for migraine.     . sertraline (ZOLOFT) 100 MG tablet Take 100 mg by mouth daily.    . silver sulfADIAZINE (SILVADENE) 1 % cream as needed.    . TRULANCE 3 MG TABS Take 3 mg by mouth as needed.    . zolpidem (AMBIEN) 10 MG tablet      No current facility-administered medications on file prior to visit.    Allergies  Allergen Reactions  . Accupril [Quinapril Hcl] Cough  . Prednisone Other (See Comments)    Has to be cautious due to Blood Sugar  . Levaquin [Levofloxacin] Rash    Objective:  General: Alert and oriented x3 in no acute distress  Dermatology: No open lesions bilateral lower extremities, no webspace macerations, no ecchymosis bilateral, all nails x 10 are short and thick but well manicured.   Vascular: Dorsalis Pedis and Posterior Tibial pedal pulses 1/4, Capillary Fill Time 3 seconds, + pedal hair growth bilateral, no edema bilateral lower extremities, Temperature gradient within normal limits.  Neurology: Johney Maine  sensation intact via light touch bilateral.   Musculoskeletal: Mild to moderate tenderness with palpation at insertion of the Achilles on Right there is calcaneal exostosis with mild soft tissue present and decreased ankle rom with knee extending  vs flexed resembling gastroc equnius bilateral, The achilles tendon has  nodularity and a  palpable dell on the right, worrisome for partial tear, Thompson sign negative.  Xrays  Right foot   Impression: No boney destruction. Calcaneal spur present. Kager's triangle intact with no obliteration proximal but ? distal. No soft tissue abnormalities or radiopaque foreign bodies.   Assessment and Plan: Problem List Items Addressed This Visit   None  Visit Diagnoses    Right foot pain    -  Primary   Acute right ankle pain       Achilles tendon tear, right, initial encounter       Relevant Orders   MR ANKLE RIGHT WO CONTRAST   Plantar fasciitis, right          -Complete examination performed -Xrays reviewed -Discussed treatment options for ? Tear of achilles  -Dispensed surgigrip sleeve and heel lift until patient can get home -Advised patient to return to using her CAM boot at all times once she gets home -Avoid steps or strenous activities  -Rx MRI to eval for tear -Recommend rest, ice, elevation, and OTC anti-inflammatories  -Patient to return to office after MRI or sooner if condition worsens.  Landis Martins, DPM

## 2020-06-05 ENCOUNTER — Telehealth: Payer: Self-pay

## 2020-06-05 NOTE — Telephone Encounter (Signed)
Contacted patient's [place] to obtained authorization for Rt ankle MRI w/o contrast. Authorization is required  Ref# [id number] Insurance rep states they need clinicals faxed to [phone].  I have faxed clinicals to the number above, waiting on response.

## 2020-06-06 ENCOUNTER — Telehealth: Payer: Self-pay | Admitting: Sports Medicine

## 2020-06-07 DIAGNOSIS — M79671 Pain in right foot: Secondary | ICD-10-CM | POA: Diagnosis not present

## 2020-06-10 ENCOUNTER — Telehealth: Payer: Self-pay

## 2020-06-10 NOTE — Telephone Encounter (Signed)
[employer] has sent a response confirmation number for scheduling pt's MRI.  [id number] Exp- 06/05/20-07/06/20 Pt's appt has been scheduled for 6/7 checking in at [facility] and order has been faxed to [facility] center

## 2020-06-11 ENCOUNTER — Ambulatory Visit: Payer: Medicare HMO | Admitting: Sports Medicine

## 2020-06-11 ENCOUNTER — Encounter: Payer: Self-pay | Admitting: Sports Medicine

## 2020-06-11 DIAGNOSIS — M7989 Other specified soft tissue disorders: Secondary | ICD-10-CM | POA: Diagnosis not present

## 2020-06-11 DIAGNOSIS — X58XXXA Exposure to other specified factors, initial encounter: Secondary | ICD-10-CM | POA: Diagnosis not present

## 2020-06-11 DIAGNOSIS — S9001XA Contusion of right ankle, initial encounter: Secondary | ICD-10-CM | POA: Diagnosis not present

## 2020-06-11 DIAGNOSIS — S86011A Strain of right Achilles tendon, initial encounter: Secondary | ICD-10-CM | POA: Diagnosis not present

## 2020-06-12 DIAGNOSIS — S86091A Other specified injury of right Achilles tendon, initial encounter: Secondary | ICD-10-CM | POA: Diagnosis not present

## 2020-06-12 DIAGNOSIS — S86011A Strain of right Achilles tendon, initial encounter: Secondary | ICD-10-CM | POA: Insufficient documentation

## 2020-06-14 ENCOUNTER — Telehealth: Payer: Self-pay

## 2020-06-14 DIAGNOSIS — R32 Unspecified urinary incontinence: Secondary | ICD-10-CM | POA: Diagnosis not present

## 2020-06-14 DIAGNOSIS — S86091A Other specified injury of right Achilles tendon, initial encounter: Secondary | ICD-10-CM | POA: Diagnosis not present

## 2020-06-14 NOTE — Telephone Encounter (Signed)
   [facility] Pre-operative Risk Assessment    Patient Name: Melanie Meyers  DOB: 1955-08-07  MRN: [id number]   HEARTCARE STAFF: - Please ensure there is not already an duplicate clearance open for this procedure. - Under Visit Info/Reason for Call, type in Other and utilize the format Clearance MM/DD/YY or Clearance TBD. Do not use dashes or single digits. - If request is for dental extraction, please clarify the # of teeth to be extracted. - If the patient is currently at the dentist's office, call Pre-Op APP to address. If the patient is not currently in the dentist office, please route to the Pre-Op pool  Request for surgical clearance:  What type of surgery is being performed? Right achilles tendon reconstruction, gastroc recession, excision of haglund deformity, possible fhl transfer to calc   When is this surgery scheduled? 06/18/20   What type of clearance is required (medical clearance vs. Pharmacy clearance to hold med vs. Both)? Medical  Are there any medications that need to be held prior to surgery and how long?   Practice name and name of physician performing surgery? [facility]- Dr. Wylene Simmer   What is the office phone number? [phone]   7.   What is the office fax number? [phone] Attention Orson Slick  8.   Anesthesia type (None, local, MAC, general) ? General   Lowella Grip 06/14/2020, 4:13 PM  _________________________________________________________________   (provider comments below)

## 2020-06-17 NOTE — Telephone Encounter (Signed)
LVM 06/17/20 x2 with no response

## 2020-06-18 DIAGNOSIS — M66361 Spontaneous rupture of flexor tendons, right lower leg: Secondary | ICD-10-CM | POA: Diagnosis not present

## 2020-06-18 DIAGNOSIS — M62461 Contracture of muscle, right lower leg: Secondary | ICD-10-CM | POA: Diagnosis not present

## 2020-06-18 DIAGNOSIS — M6701 Short Achilles tendon (acquired), right ankle: Secondary | ICD-10-CM | POA: Diagnosis not present

## 2020-06-18 DIAGNOSIS — M7661 Achilles tendinitis, right leg: Secondary | ICD-10-CM | POA: Diagnosis not present

## 2020-06-18 DIAGNOSIS — M25774 Osteophyte, right foot: Secondary | ICD-10-CM | POA: Diagnosis not present

## 2020-06-18 DIAGNOSIS — G8918 Other acute postprocedural pain: Secondary | ICD-10-CM | POA: Diagnosis not present

## 2020-06-18 DIAGNOSIS — M958 Other specified acquired deformities of musculoskeletal system: Secondary | ICD-10-CM | POA: Diagnosis not present

## 2020-06-18 DIAGNOSIS — M66871 Spontaneous rupture of other tendons, right ankle and foot: Secondary | ICD-10-CM | POA: Diagnosis not present

## 2020-06-18 NOTE — Telephone Encounter (Signed)
    Melanie Meyers DOB:  [birth year]  MRN:  [id number]   Primary Cardiologist: Dr. Bettina Gavia, MD   Chart reviewed as part of pre-operative protocol coverage. Given past medical history and time since last visit, based on ACC/AHA guidelines, Melanie Meyers would be at acceptable risk for the planned procedure without further cardiovascular testing.   The patient was advised that if she develops new symptoms prior to surgery to contact our office to arrange for a follow-up visit, and she verbalized understanding.  I will route this recommendation to the requesting party via Epic fax function and remove from pre-op pool.  Please call with questions.  Kathyrn Drown, NP 06/18/2020, 8:44 AM

## 2020-06-26 NOTE — Telephone Encounter (Signed)
error 

## 2020-06-30 ENCOUNTER — Other Ambulatory Visit: Payer: Self-pay | Admitting: Cardiology

## 2020-07-01 NOTE — Telephone Encounter (Signed)
Rx approved and sent 

## 2020-07-02 DIAGNOSIS — S86091A Other specified injury of right Achilles tendon, initial encounter: Secondary | ICD-10-CM | POA: Diagnosis not present

## 2020-07-02 DIAGNOSIS — Z4889 Encounter for other specified surgical aftercare: Secondary | ICD-10-CM | POA: Diagnosis not present

## 2020-07-11 DIAGNOSIS — R300 Dysuria: Secondary | ICD-10-CM | POA: Diagnosis not present

## 2020-07-11 DIAGNOSIS — G4700 Insomnia, unspecified: Secondary | ICD-10-CM | POA: Diagnosis not present

## 2020-07-11 DIAGNOSIS — D860 Sarcoidosis of lung: Secondary | ICD-10-CM | POA: Diagnosis not present

## 2020-07-11 DIAGNOSIS — K581 Irritable bowel syndrome with constipation: Secondary | ICD-10-CM | POA: Diagnosis not present

## 2020-07-11 DIAGNOSIS — E7800 Pure hypercholesterolemia, unspecified: Secondary | ICD-10-CM | POA: Diagnosis not present

## 2020-07-11 DIAGNOSIS — E1149 Type 2 diabetes mellitus with other diabetic neurological complication: Secondary | ICD-10-CM | POA: Diagnosis not present

## 2020-07-11 DIAGNOSIS — E039 Hypothyroidism, unspecified: Secondary | ICD-10-CM | POA: Diagnosis not present

## 2020-07-11 DIAGNOSIS — R3129 Other microscopic hematuria: Secondary | ICD-10-CM | POA: Diagnosis not present

## 2020-07-11 DIAGNOSIS — I10 Essential (primary) hypertension: Secondary | ICD-10-CM | POA: Diagnosis not present

## 2020-07-16 DIAGNOSIS — U071 COVID-19: Secondary | ICD-10-CM | POA: Diagnosis not present

## 2020-07-18 DIAGNOSIS — U071 COVID-19: Secondary | ICD-10-CM | POA: Diagnosis not present

## 2020-07-25 DIAGNOSIS — E1149 Type 2 diabetes mellitus with other diabetic neurological complication: Secondary | ICD-10-CM | POA: Diagnosis not present

## 2020-07-25 DIAGNOSIS — I10 Essential (primary) hypertension: Secondary | ICD-10-CM | POA: Diagnosis not present

## 2020-07-25 DIAGNOSIS — E039 Hypothyroidism, unspecified: Secondary | ICD-10-CM | POA: Diagnosis not present

## 2020-07-26 DIAGNOSIS — R0789 Other chest pain: Secondary | ICD-10-CM | POA: Diagnosis not present

## 2020-07-26 DIAGNOSIS — I2510 Atherosclerotic heart disease of native coronary artery without angina pectoris: Secondary | ICD-10-CM | POA: Diagnosis not present

## 2020-07-26 DIAGNOSIS — E785 Hyperlipidemia, unspecified: Secondary | ICD-10-CM | POA: Diagnosis not present

## 2020-07-26 DIAGNOSIS — I10 Essential (primary) hypertension: Secondary | ICD-10-CM | POA: Diagnosis not present

## 2020-07-26 DIAGNOSIS — R0602 Shortness of breath: Secondary | ICD-10-CM | POA: Diagnosis not present

## 2020-07-31 DIAGNOSIS — M4304 Spondylolysis, thoracic region: Secondary | ICD-10-CM | POA: Diagnosis not present

## 2020-07-31 DIAGNOSIS — M9901 Segmental and somatic dysfunction of cervical region: Secondary | ICD-10-CM | POA: Diagnosis not present

## 2020-07-31 DIAGNOSIS — M5388 Other specified dorsopathies, sacral and sacrococcygeal region: Secondary | ICD-10-CM | POA: Diagnosis not present

## 2020-07-31 DIAGNOSIS — M9904 Segmental and somatic dysfunction of sacral region: Secondary | ICD-10-CM | POA: Diagnosis not present

## 2020-07-31 DIAGNOSIS — M9902 Segmental and somatic dysfunction of thoracic region: Secondary | ICD-10-CM | POA: Diagnosis not present

## 2020-07-31 DIAGNOSIS — M47893 Other spondylosis, cervicothoracic region: Secondary | ICD-10-CM | POA: Diagnosis not present

## 2020-08-01 DIAGNOSIS — M170 Bilateral primary osteoarthritis of knee: Secondary | ICD-10-CM | POA: Diagnosis not present

## 2020-08-05 DIAGNOSIS — S86091A Other specified injury of right Achilles tendon, initial encounter: Secondary | ICD-10-CM | POA: Diagnosis not present

## 2020-08-06 DIAGNOSIS — R32 Unspecified urinary incontinence: Secondary | ICD-10-CM | POA: Diagnosis not present

## 2020-08-06 DIAGNOSIS — K219 Gastro-esophageal reflux disease without esophagitis: Secondary | ICD-10-CM | POA: Diagnosis not present

## 2020-08-06 DIAGNOSIS — Z1211 Encounter for screening for malignant neoplasm of colon: Secondary | ICD-10-CM | POA: Diagnosis not present

## 2020-08-06 DIAGNOSIS — K5909 Other constipation: Secondary | ICD-10-CM | POA: Diagnosis not present

## 2020-08-07 DIAGNOSIS — Z4789 Encounter for other orthopedic aftercare: Secondary | ICD-10-CM | POA: Diagnosis not present

## 2020-08-07 DIAGNOSIS — S83281D Other tear of lateral meniscus, current injury, right knee, subsequent encounter: Secondary | ICD-10-CM | POA: Diagnosis not present

## 2020-08-07 DIAGNOSIS — M1711 Unilateral primary osteoarthritis, right knee: Secondary | ICD-10-CM | POA: Diagnosis not present

## 2020-08-07 DIAGNOSIS — S86011D Strain of right Achilles tendon, subsequent encounter: Secondary | ICD-10-CM | POA: Diagnosis not present

## 2020-08-07 DIAGNOSIS — M7051 Other bursitis of knee, right knee: Secondary | ICD-10-CM | POA: Diagnosis not present

## 2020-08-07 DIAGNOSIS — M5010 Cervical disc disorder with radiculopathy, unspecified cervical region: Secondary | ICD-10-CM | POA: Diagnosis not present

## 2020-08-07 DIAGNOSIS — M2021 Hallux rigidus, right foot: Secondary | ICD-10-CM | POA: Diagnosis not present

## 2020-08-07 DIAGNOSIS — M4802 Spinal stenosis, cervical region: Secondary | ICD-10-CM | POA: Diagnosis not present

## 2020-08-07 DIAGNOSIS — M19071 Primary osteoarthritis, right ankle and foot: Secondary | ICD-10-CM | POA: Diagnosis not present

## 2020-08-12 ENCOUNTER — Other Ambulatory Visit: Payer: Self-pay

## 2020-08-12 ENCOUNTER — Ambulatory Visit (INDEPENDENT_AMBULATORY_CARE_PROVIDER_SITE_OTHER): Payer: Medicare HMO | Admitting: Sports Medicine

## 2020-08-12 ENCOUNTER — Encounter: Payer: Self-pay | Admitting: Sports Medicine

## 2020-08-12 DIAGNOSIS — E669 Obesity, unspecified: Secondary | ICD-10-CM

## 2020-08-12 DIAGNOSIS — Z6841 Body Mass Index (BMI) 40.0 and over, adult: Secondary | ICD-10-CM | POA: Diagnosis not present

## 2020-08-12 DIAGNOSIS — E1140 Type 2 diabetes mellitus with diabetic neuropathy, unspecified: Secondary | ICD-10-CM | POA: Diagnosis not present

## 2020-08-12 DIAGNOSIS — R110 Nausea: Secondary | ICD-10-CM | POA: Diagnosis not present

## 2020-08-12 DIAGNOSIS — I739 Peripheral vascular disease, unspecified: Secondary | ICD-10-CM | POA: Diagnosis not present

## 2020-08-12 DIAGNOSIS — M2141 Flat foot [pes planus] (acquired), right foot: Secondary | ICD-10-CM

## 2020-08-12 DIAGNOSIS — E1142 Type 2 diabetes mellitus with diabetic polyneuropathy: Secondary | ICD-10-CM | POA: Diagnosis not present

## 2020-08-12 DIAGNOSIS — M2142 Flat foot [pes planus] (acquired), left foot: Secondary | ICD-10-CM

## 2020-08-12 DIAGNOSIS — R635 Abnormal weight gain: Secondary | ICD-10-CM | POA: Diagnosis not present

## 2020-08-12 DIAGNOSIS — E1151 Type 2 diabetes mellitus with diabetic peripheral angiopathy without gangrene: Secondary | ICD-10-CM | POA: Diagnosis not present

## 2020-08-12 DIAGNOSIS — E1169 Type 2 diabetes mellitus with other specified complication: Secondary | ICD-10-CM

## 2020-08-12 DIAGNOSIS — M722 Plantar fascial fibromatosis: Secondary | ICD-10-CM

## 2020-08-12 DIAGNOSIS — S86011A Strain of right Achilles tendon, initial encounter: Secondary | ICD-10-CM

## 2020-08-12 DIAGNOSIS — E1149 Type 2 diabetes mellitus with other diabetic neurological complication: Secondary | ICD-10-CM | POA: Diagnosis not present

## 2020-08-12 DIAGNOSIS — E119 Type 2 diabetes mellitus without complications: Secondary | ICD-10-CM | POA: Diagnosis not present

## 2020-08-12 NOTE — Progress Notes (Signed)
Patient discussed with medical assistant. Agree with her note. Patient to follow up as scheduled for continued care or sooner if problems or issues arise. -Dr. Cannon Kettle

## 2020-08-12 NOTE — Progress Notes (Signed)
Patient presents for diabetic shoe pick up, shoes are tried on for good fit.  Patient received 1 Pair and 3 pairs custom molded diabetic inserts.  Verbal and written break in and wear instructions given.  Patient will follow up for scheduled routine care.   

## 2020-08-13 DIAGNOSIS — S86011D Strain of right Achilles tendon, subsequent encounter: Secondary | ICD-10-CM | POA: Diagnosis not present

## 2020-08-13 DIAGNOSIS — S83281D Other tear of lateral meniscus, current injury, right knee, subsequent encounter: Secondary | ICD-10-CM | POA: Diagnosis not present

## 2020-08-13 DIAGNOSIS — M19071 Primary osteoarthritis, right ankle and foot: Secondary | ICD-10-CM | POA: Diagnosis not present

## 2020-08-13 DIAGNOSIS — M2021 Hallux rigidus, right foot: Secondary | ICD-10-CM | POA: Diagnosis not present

## 2020-08-13 DIAGNOSIS — M7051 Other bursitis of knee, right knee: Secondary | ICD-10-CM | POA: Diagnosis not present

## 2020-08-13 DIAGNOSIS — M5010 Cervical disc disorder with radiculopathy, unspecified cervical region: Secondary | ICD-10-CM | POA: Diagnosis not present

## 2020-08-13 DIAGNOSIS — M1711 Unilateral primary osteoarthritis, right knee: Secondary | ICD-10-CM | POA: Diagnosis not present

## 2020-08-13 DIAGNOSIS — Z4789 Encounter for other orthopedic aftercare: Secondary | ICD-10-CM | POA: Diagnosis not present

## 2020-08-13 DIAGNOSIS — M4802 Spinal stenosis, cervical region: Secondary | ICD-10-CM | POA: Diagnosis not present

## 2020-08-14 DIAGNOSIS — H9193 Unspecified hearing loss, bilateral: Secondary | ICD-10-CM | POA: Diagnosis not present

## 2020-08-14 DIAGNOSIS — M4802 Spinal stenosis, cervical region: Secondary | ICD-10-CM | POA: Diagnosis not present

## 2020-08-14 DIAGNOSIS — H8112 Benign paroxysmal vertigo, left ear: Secondary | ICD-10-CM | POA: Diagnosis not present

## 2020-08-14 DIAGNOSIS — Z4789 Encounter for other orthopedic aftercare: Secondary | ICD-10-CM | POA: Diagnosis not present

## 2020-08-14 DIAGNOSIS — S83281D Other tear of lateral meniscus, current injury, right knee, subsequent encounter: Secondary | ICD-10-CM | POA: Diagnosis not present

## 2020-08-14 DIAGNOSIS — M2021 Hallux rigidus, right foot: Secondary | ICD-10-CM | POA: Diagnosis not present

## 2020-08-14 DIAGNOSIS — M1711 Unilateral primary osteoarthritis, right knee: Secondary | ICD-10-CM | POA: Diagnosis not present

## 2020-08-14 DIAGNOSIS — J3489 Other specified disorders of nose and nasal sinuses: Secondary | ICD-10-CM | POA: Diagnosis not present

## 2020-08-14 DIAGNOSIS — S86011D Strain of right Achilles tendon, subsequent encounter: Secondary | ICD-10-CM | POA: Diagnosis not present

## 2020-08-14 DIAGNOSIS — M7051 Other bursitis of knee, right knee: Secondary | ICD-10-CM | POA: Diagnosis not present

## 2020-08-14 DIAGNOSIS — G4733 Obstructive sleep apnea (adult) (pediatric): Secondary | ICD-10-CM | POA: Diagnosis not present

## 2020-08-14 DIAGNOSIS — M5010 Cervical disc disorder with radiculopathy, unspecified cervical region: Secondary | ICD-10-CM | POA: Diagnosis not present

## 2020-08-14 DIAGNOSIS — M19071 Primary osteoarthritis, right ankle and foot: Secondary | ICD-10-CM | POA: Diagnosis not present

## 2020-08-20 DIAGNOSIS — M2021 Hallux rigidus, right foot: Secondary | ICD-10-CM | POA: Diagnosis not present

## 2020-08-20 DIAGNOSIS — Z4789 Encounter for other orthopedic aftercare: Secondary | ICD-10-CM | POA: Diagnosis not present

## 2020-08-20 DIAGNOSIS — M4802 Spinal stenosis, cervical region: Secondary | ICD-10-CM | POA: Diagnosis not present

## 2020-08-20 DIAGNOSIS — S86011D Strain of right Achilles tendon, subsequent encounter: Secondary | ICD-10-CM | POA: Diagnosis not present

## 2020-08-20 DIAGNOSIS — M1711 Unilateral primary osteoarthritis, right knee: Secondary | ICD-10-CM | POA: Diagnosis not present

## 2020-08-20 DIAGNOSIS — M5010 Cervical disc disorder with radiculopathy, unspecified cervical region: Secondary | ICD-10-CM | POA: Diagnosis not present

## 2020-08-20 DIAGNOSIS — S83281D Other tear of lateral meniscus, current injury, right knee, subsequent encounter: Secondary | ICD-10-CM | POA: Diagnosis not present

## 2020-08-20 DIAGNOSIS — M7051 Other bursitis of knee, right knee: Secondary | ICD-10-CM | POA: Diagnosis not present

## 2020-08-20 DIAGNOSIS — M19071 Primary osteoarthritis, right ankle and foot: Secondary | ICD-10-CM | POA: Diagnosis not present

## 2020-08-21 DIAGNOSIS — K5909 Other constipation: Secondary | ICD-10-CM | POA: Diagnosis not present

## 2020-08-28 DIAGNOSIS — K3189 Other diseases of stomach and duodenum: Secondary | ICD-10-CM | POA: Diagnosis not present

## 2020-08-28 DIAGNOSIS — D122 Benign neoplasm of ascending colon: Secondary | ICD-10-CM | POA: Diagnosis not present

## 2020-08-28 DIAGNOSIS — Z1211 Encounter for screening for malignant neoplasm of colon: Secondary | ICD-10-CM | POA: Diagnosis not present

## 2020-08-28 DIAGNOSIS — K648 Other hemorrhoids: Secondary | ICD-10-CM | POA: Diagnosis not present

## 2020-08-28 DIAGNOSIS — K2970 Gastritis, unspecified, without bleeding: Secondary | ICD-10-CM | POA: Diagnosis not present

## 2020-08-28 DIAGNOSIS — K635 Polyp of colon: Secondary | ICD-10-CM | POA: Diagnosis not present

## 2020-08-28 DIAGNOSIS — D120 Benign neoplasm of cecum: Secondary | ICD-10-CM | POA: Diagnosis not present

## 2020-08-28 DIAGNOSIS — K219 Gastro-esophageal reflux disease without esophagitis: Secondary | ICD-10-CM | POA: Diagnosis not present

## 2020-08-30 DIAGNOSIS — S86011D Strain of right Achilles tendon, subsequent encounter: Secondary | ICD-10-CM | POA: Diagnosis not present

## 2020-08-30 DIAGNOSIS — M2021 Hallux rigidus, right foot: Secondary | ICD-10-CM | POA: Diagnosis not present

## 2020-08-30 DIAGNOSIS — Z4789 Encounter for other orthopedic aftercare: Secondary | ICD-10-CM | POA: Diagnosis not present

## 2020-08-30 DIAGNOSIS — M19071 Primary osteoarthritis, right ankle and foot: Secondary | ICD-10-CM | POA: Diagnosis not present

## 2020-08-30 DIAGNOSIS — M4802 Spinal stenosis, cervical region: Secondary | ICD-10-CM | POA: Diagnosis not present

## 2020-08-30 DIAGNOSIS — S83281D Other tear of lateral meniscus, current injury, right knee, subsequent encounter: Secondary | ICD-10-CM | POA: Diagnosis not present

## 2020-08-30 DIAGNOSIS — M5010 Cervical disc disorder with radiculopathy, unspecified cervical region: Secondary | ICD-10-CM | POA: Diagnosis not present

## 2020-08-30 DIAGNOSIS — M7051 Other bursitis of knee, right knee: Secondary | ICD-10-CM | POA: Diagnosis not present

## 2020-08-30 DIAGNOSIS — M1711 Unilateral primary osteoarthritis, right knee: Secondary | ICD-10-CM | POA: Diagnosis not present

## 2020-09-03 DIAGNOSIS — S86011D Strain of right Achilles tendon, subsequent encounter: Secondary | ICD-10-CM | POA: Diagnosis not present

## 2020-09-03 DIAGNOSIS — K5909 Other constipation: Secondary | ICD-10-CM | POA: Diagnosis not present

## 2020-09-03 DIAGNOSIS — M2021 Hallux rigidus, right foot: Secondary | ICD-10-CM | POA: Diagnosis not present

## 2020-09-03 DIAGNOSIS — K635 Polyp of colon: Secondary | ICD-10-CM | POA: Diagnosis not present

## 2020-09-03 DIAGNOSIS — M4802 Spinal stenosis, cervical region: Secondary | ICD-10-CM | POA: Diagnosis not present

## 2020-09-03 DIAGNOSIS — K219 Gastro-esophageal reflux disease without esophagitis: Secondary | ICD-10-CM | POA: Diagnosis not present

## 2020-09-03 DIAGNOSIS — M5010 Cervical disc disorder with radiculopathy, unspecified cervical region: Secondary | ICD-10-CM | POA: Diagnosis not present

## 2020-09-03 DIAGNOSIS — M7051 Other bursitis of knee, right knee: Secondary | ICD-10-CM | POA: Diagnosis not present

## 2020-09-03 DIAGNOSIS — M1711 Unilateral primary osteoarthritis, right knee: Secondary | ICD-10-CM | POA: Diagnosis not present

## 2020-09-03 DIAGNOSIS — S83281D Other tear of lateral meniscus, current injury, right knee, subsequent encounter: Secondary | ICD-10-CM | POA: Diagnosis not present

## 2020-09-03 DIAGNOSIS — Z4789 Encounter for other orthopedic aftercare: Secondary | ICD-10-CM | POA: Diagnosis not present

## 2020-09-03 DIAGNOSIS — K649 Unspecified hemorrhoids: Secondary | ICD-10-CM | POA: Diagnosis not present

## 2020-09-03 DIAGNOSIS — M19071 Primary osteoarthritis, right ankle and foot: Secondary | ICD-10-CM | POA: Diagnosis not present

## 2020-09-04 DIAGNOSIS — M5388 Other specified dorsopathies, sacral and sacrococcygeal region: Secondary | ICD-10-CM | POA: Diagnosis not present

## 2020-09-04 DIAGNOSIS — M9904 Segmental and somatic dysfunction of sacral region: Secondary | ICD-10-CM | POA: Diagnosis not present

## 2020-09-04 DIAGNOSIS — M9903 Segmental and somatic dysfunction of lumbar region: Secondary | ICD-10-CM | POA: Diagnosis not present

## 2020-09-04 DIAGNOSIS — M4304 Spondylolysis, thoracic region: Secondary | ICD-10-CM | POA: Diagnosis not present

## 2020-09-04 DIAGNOSIS — M4727 Other spondylosis with radiculopathy, lumbosacral region: Secondary | ICD-10-CM | POA: Diagnosis not present

## 2020-09-04 DIAGNOSIS — M9902 Segmental and somatic dysfunction of thoracic region: Secondary | ICD-10-CM | POA: Diagnosis not present

## 2020-09-06 DIAGNOSIS — M19071 Primary osteoarthritis, right ankle and foot: Secondary | ICD-10-CM | POA: Diagnosis not present

## 2020-09-06 DIAGNOSIS — M5010 Cervical disc disorder with radiculopathy, unspecified cervical region: Secondary | ICD-10-CM | POA: Diagnosis not present

## 2020-09-06 DIAGNOSIS — M4802 Spinal stenosis, cervical region: Secondary | ICD-10-CM | POA: Diagnosis not present

## 2020-09-06 DIAGNOSIS — M7051 Other bursitis of knee, right knee: Secondary | ICD-10-CM | POA: Diagnosis not present

## 2020-09-06 DIAGNOSIS — S83281D Other tear of lateral meniscus, current injury, right knee, subsequent encounter: Secondary | ICD-10-CM | POA: Diagnosis not present

## 2020-09-06 DIAGNOSIS — M2021 Hallux rigidus, right foot: Secondary | ICD-10-CM | POA: Diagnosis not present

## 2020-09-06 DIAGNOSIS — Z4789 Encounter for other orthopedic aftercare: Secondary | ICD-10-CM | POA: Diagnosis not present

## 2020-09-06 DIAGNOSIS — S86011D Strain of right Achilles tendon, subsequent encounter: Secondary | ICD-10-CM | POA: Diagnosis not present

## 2020-09-06 DIAGNOSIS — M1711 Unilateral primary osteoarthritis, right knee: Secondary | ICD-10-CM | POA: Diagnosis not present

## 2020-09-10 DIAGNOSIS — M7051 Other bursitis of knee, right knee: Secondary | ICD-10-CM | POA: Diagnosis not present

## 2020-09-10 DIAGNOSIS — S86011D Strain of right Achilles tendon, subsequent encounter: Secondary | ICD-10-CM | POA: Diagnosis not present

## 2020-09-10 DIAGNOSIS — M5010 Cervical disc disorder with radiculopathy, unspecified cervical region: Secondary | ICD-10-CM | POA: Diagnosis not present

## 2020-09-10 DIAGNOSIS — M2021 Hallux rigidus, right foot: Secondary | ICD-10-CM | POA: Diagnosis not present

## 2020-09-10 DIAGNOSIS — M19071 Primary osteoarthritis, right ankle and foot: Secondary | ICD-10-CM | POA: Diagnosis not present

## 2020-09-10 DIAGNOSIS — S83281D Other tear of lateral meniscus, current injury, right knee, subsequent encounter: Secondary | ICD-10-CM | POA: Diagnosis not present

## 2020-09-10 DIAGNOSIS — M4802 Spinal stenosis, cervical region: Secondary | ICD-10-CM | POA: Diagnosis not present

## 2020-09-10 DIAGNOSIS — M1711 Unilateral primary osteoarthritis, right knee: Secondary | ICD-10-CM | POA: Diagnosis not present

## 2020-09-10 DIAGNOSIS — Z4789 Encounter for other orthopedic aftercare: Secondary | ICD-10-CM | POA: Diagnosis not present

## 2020-09-11 ENCOUNTER — Other Ambulatory Visit: Payer: Self-pay | Admitting: Cardiology

## 2020-09-11 DIAGNOSIS — J301 Allergic rhinitis due to pollen: Secondary | ICD-10-CM | POA: Diagnosis not present

## 2020-09-11 DIAGNOSIS — J310 Chronic rhinitis: Secondary | ICD-10-CM | POA: Diagnosis not present

## 2020-09-11 NOTE — Telephone Encounter (Signed)
Olmesartan 20 mg # 45 only , with message patient needs appointment for further refills sent to   [facility] ([facility] - [place], [place]

## 2020-09-14 DIAGNOSIS — S86091A Other specified injury of right Achilles tendon, initial encounter: Secondary | ICD-10-CM | POA: Diagnosis not present

## 2020-09-16 DIAGNOSIS — M1711 Unilateral primary osteoarthritis, right knee: Secondary | ICD-10-CM | POA: Diagnosis not present

## 2020-09-16 DIAGNOSIS — M25561 Pain in right knee: Secondary | ICD-10-CM | POA: Diagnosis not present

## 2020-09-17 DIAGNOSIS — J301 Allergic rhinitis due to pollen: Secondary | ICD-10-CM | POA: Diagnosis not present

## 2020-09-17 DIAGNOSIS — D860 Sarcoidosis of lung: Secondary | ICD-10-CM | POA: Diagnosis not present

## 2020-09-17 DIAGNOSIS — J45909 Unspecified asthma, uncomplicated: Secondary | ICD-10-CM | POA: Diagnosis not present

## 2020-09-17 DIAGNOSIS — G4733 Obstructive sleep apnea (adult) (pediatric): Secondary | ICD-10-CM | POA: Diagnosis not present

## 2020-09-17 DIAGNOSIS — K219 Gastro-esophageal reflux disease without esophagitis: Secondary | ICD-10-CM | POA: Diagnosis not present

## 2020-09-18 DIAGNOSIS — M19071 Primary osteoarthritis, right ankle and foot: Secondary | ICD-10-CM | POA: Diagnosis not present

## 2020-09-18 DIAGNOSIS — M7051 Other bursitis of knee, right knee: Secondary | ICD-10-CM | POA: Diagnosis not present

## 2020-09-18 DIAGNOSIS — M4802 Spinal stenosis, cervical region: Secondary | ICD-10-CM | POA: Diagnosis not present

## 2020-09-18 DIAGNOSIS — M5010 Cervical disc disorder with radiculopathy, unspecified cervical region: Secondary | ICD-10-CM | POA: Diagnosis not present

## 2020-09-18 DIAGNOSIS — S83281D Other tear of lateral meniscus, current injury, right knee, subsequent encounter: Secondary | ICD-10-CM | POA: Diagnosis not present

## 2020-09-18 DIAGNOSIS — Z4789 Encounter for other orthopedic aftercare: Secondary | ICD-10-CM | POA: Diagnosis not present

## 2020-09-18 DIAGNOSIS — M2021 Hallux rigidus, right foot: Secondary | ICD-10-CM | POA: Diagnosis not present

## 2020-09-18 DIAGNOSIS — M1711 Unilateral primary osteoarthritis, right knee: Secondary | ICD-10-CM | POA: Diagnosis not present

## 2020-09-18 DIAGNOSIS — S86011D Strain of right Achilles tendon, subsequent encounter: Secondary | ICD-10-CM | POA: Diagnosis not present

## 2020-09-24 DIAGNOSIS — H903 Sensorineural hearing loss, bilateral: Secondary | ICD-10-CM | POA: Diagnosis not present

## 2020-09-25 DIAGNOSIS — S86011D Strain of right Achilles tendon, subsequent encounter: Secondary | ICD-10-CM | POA: Diagnosis not present

## 2020-09-25 DIAGNOSIS — M2021 Hallux rigidus, right foot: Secondary | ICD-10-CM | POA: Diagnosis not present

## 2020-09-25 DIAGNOSIS — M5010 Cervical disc disorder with radiculopathy, unspecified cervical region: Secondary | ICD-10-CM | POA: Diagnosis not present

## 2020-09-25 DIAGNOSIS — M19071 Primary osteoarthritis, right ankle and foot: Secondary | ICD-10-CM | POA: Diagnosis not present

## 2020-09-25 DIAGNOSIS — M4802 Spinal stenosis, cervical region: Secondary | ICD-10-CM | POA: Diagnosis not present

## 2020-09-25 DIAGNOSIS — Z4789 Encounter for other orthopedic aftercare: Secondary | ICD-10-CM | POA: Diagnosis not present

## 2020-09-25 DIAGNOSIS — M1711 Unilateral primary osteoarthritis, right knee: Secondary | ICD-10-CM | POA: Diagnosis not present

## 2020-09-25 DIAGNOSIS — S83281D Other tear of lateral meniscus, current injury, right knee, subsequent encounter: Secondary | ICD-10-CM | POA: Diagnosis not present

## 2020-09-25 DIAGNOSIS — M7051 Other bursitis of knee, right knee: Secondary | ICD-10-CM | POA: Diagnosis not present

## 2020-10-01 DIAGNOSIS — G4700 Insomnia, unspecified: Secondary | ICD-10-CM | POA: Diagnosis not present

## 2020-10-01 DIAGNOSIS — I10 Essential (primary) hypertension: Secondary | ICD-10-CM | POA: Diagnosis not present

## 2020-10-01 DIAGNOSIS — I2510 Atherosclerotic heart disease of native coronary artery without angina pectoris: Secondary | ICD-10-CM | POA: Diagnosis not present

## 2020-10-01 DIAGNOSIS — E039 Hypothyroidism, unspecified: Secondary | ICD-10-CM | POA: Diagnosis not present

## 2020-10-01 DIAGNOSIS — G43909 Migraine, unspecified, not intractable, without status migrainosus: Secondary | ICD-10-CM | POA: Diagnosis not present

## 2020-10-01 DIAGNOSIS — K581 Irritable bowel syndrome with constipation: Secondary | ICD-10-CM | POA: Diagnosis not present

## 2020-10-01 DIAGNOSIS — E785 Hyperlipidemia, unspecified: Secondary | ICD-10-CM | POA: Diagnosis not present

## 2020-10-01 DIAGNOSIS — G4733 Obstructive sleep apnea (adult) (pediatric): Secondary | ICD-10-CM | POA: Diagnosis not present

## 2020-10-01 DIAGNOSIS — E1149 Type 2 diabetes mellitus with other diabetic neurological complication: Secondary | ICD-10-CM | POA: Diagnosis not present

## 2020-10-02 DIAGNOSIS — M2021 Hallux rigidus, right foot: Secondary | ICD-10-CM | POA: Diagnosis not present

## 2020-10-02 DIAGNOSIS — M19071 Primary osteoarthritis, right ankle and foot: Secondary | ICD-10-CM | POA: Diagnosis not present

## 2020-10-02 DIAGNOSIS — S83281D Other tear of lateral meniscus, current injury, right knee, subsequent encounter: Secondary | ICD-10-CM | POA: Diagnosis not present

## 2020-10-02 DIAGNOSIS — M4802 Spinal stenosis, cervical region: Secondary | ICD-10-CM | POA: Diagnosis not present

## 2020-10-02 DIAGNOSIS — M7051 Other bursitis of knee, right knee: Secondary | ICD-10-CM | POA: Diagnosis not present

## 2020-10-02 DIAGNOSIS — S86011D Strain of right Achilles tendon, subsequent encounter: Secondary | ICD-10-CM | POA: Diagnosis not present

## 2020-10-02 DIAGNOSIS — Z4789 Encounter for other orthopedic aftercare: Secondary | ICD-10-CM | POA: Diagnosis not present

## 2020-10-02 DIAGNOSIS — M5010 Cervical disc disorder with radiculopathy, unspecified cervical region: Secondary | ICD-10-CM | POA: Diagnosis not present

## 2020-10-02 DIAGNOSIS — M1711 Unilateral primary osteoarthritis, right knee: Secondary | ICD-10-CM | POA: Diagnosis not present

## 2020-10-04 DIAGNOSIS — M21961 Unspecified acquired deformity of right lower leg: Secondary | ICD-10-CM | POA: Diagnosis not present

## 2020-10-04 DIAGNOSIS — J320 Chronic maxillary sinusitis: Secondary | ICD-10-CM | POA: Diagnosis not present

## 2020-10-08 DIAGNOSIS — I2510 Atherosclerotic heart disease of native coronary artery without angina pectoris: Secondary | ICD-10-CM | POA: Diagnosis not present

## 2020-10-14 DIAGNOSIS — S86091A Other specified injury of right Achilles tendon, initial encounter: Secondary | ICD-10-CM | POA: Diagnosis not present

## 2020-10-15 DIAGNOSIS — B351 Tinea unguium: Secondary | ICD-10-CM | POA: Diagnosis not present

## 2020-10-15 DIAGNOSIS — Z794 Long term (current) use of insulin: Secondary | ICD-10-CM | POA: Diagnosis not present

## 2020-10-15 DIAGNOSIS — E1140 Type 2 diabetes mellitus with diabetic neuropathy, unspecified: Secondary | ICD-10-CM | POA: Diagnosis not present

## 2020-10-15 DIAGNOSIS — L603 Nail dystrophy: Secondary | ICD-10-CM | POA: Diagnosis not present

## 2020-10-16 DIAGNOSIS — I2510 Atherosclerotic heart disease of native coronary artery without angina pectoris: Secondary | ICD-10-CM | POA: Diagnosis not present

## 2020-10-16 DIAGNOSIS — I10 Essential (primary) hypertension: Secondary | ICD-10-CM | POA: Diagnosis not present

## 2020-10-16 DIAGNOSIS — R9439 Abnormal result of other cardiovascular function study: Secondary | ICD-10-CM | POA: Diagnosis not present

## 2020-10-16 DIAGNOSIS — R0602 Shortness of breath: Secondary | ICD-10-CM | POA: Diagnosis not present

## 2020-10-18 DIAGNOSIS — K5909 Other constipation: Secondary | ICD-10-CM | POA: Diagnosis not present

## 2020-10-18 DIAGNOSIS — K219 Gastro-esophageal reflux disease without esophagitis: Secondary | ICD-10-CM | POA: Diagnosis not present

## 2020-10-22 DIAGNOSIS — D860 Sarcoidosis of lung: Secondary | ICD-10-CM | POA: Diagnosis not present

## 2020-10-22 DIAGNOSIS — J45909 Unspecified asthma, uncomplicated: Secondary | ICD-10-CM | POA: Diagnosis not present

## 2020-10-23 DIAGNOSIS — Z01818 Encounter for other preprocedural examination: Secondary | ICD-10-CM | POA: Diagnosis not present

## 2020-10-23 DIAGNOSIS — G4733 Obstructive sleep apnea (adult) (pediatric): Secondary | ICD-10-CM | POA: Diagnosis not present

## 2020-10-23 DIAGNOSIS — J342 Deviated nasal septum: Secondary | ICD-10-CM | POA: Diagnosis not present

## 2020-10-23 DIAGNOSIS — I10 Essential (primary) hypertension: Secondary | ICD-10-CM | POA: Diagnosis not present

## 2020-10-23 DIAGNOSIS — J320 Chronic maxillary sinusitis: Secondary | ICD-10-CM | POA: Diagnosis not present

## 2020-10-23 DIAGNOSIS — D860 Sarcoidosis of lung: Secondary | ICD-10-CM | POA: Diagnosis not present

## 2020-10-23 DIAGNOSIS — E1140 Type 2 diabetes mellitus with diabetic neuropathy, unspecified: Secondary | ICD-10-CM | POA: Diagnosis not present

## 2020-10-23 DIAGNOSIS — N183 Chronic kidney disease, stage 3 unspecified: Secondary | ICD-10-CM | POA: Diagnosis not present

## 2020-10-23 DIAGNOSIS — J3489 Other specified disorders of nose and nasal sinuses: Secondary | ICD-10-CM | POA: Diagnosis not present

## 2020-10-23 DIAGNOSIS — I2510 Atherosclerotic heart disease of native coronary artery without angina pectoris: Secondary | ICD-10-CM | POA: Diagnosis not present

## 2020-10-23 DIAGNOSIS — Z794 Long term (current) use of insulin: Secondary | ICD-10-CM | POA: Diagnosis not present

## 2020-10-23 DIAGNOSIS — E1149 Type 2 diabetes mellitus with other diabetic neurological complication: Secondary | ICD-10-CM | POA: Diagnosis not present

## 2020-10-24 DIAGNOSIS — Z79899 Other long term (current) drug therapy: Secondary | ICD-10-CM | POA: Diagnosis not present

## 2020-10-24 DIAGNOSIS — Z7982 Long term (current) use of aspirin: Secondary | ICD-10-CM | POA: Diagnosis not present

## 2020-10-24 DIAGNOSIS — I25118 Atherosclerotic heart disease of native coronary artery with other forms of angina pectoris: Secondary | ICD-10-CM | POA: Diagnosis not present

## 2020-10-24 DIAGNOSIS — R0602 Shortness of breath: Secondary | ICD-10-CM | POA: Diagnosis not present

## 2020-10-24 DIAGNOSIS — I10 Essential (primary) hypertension: Secondary | ICD-10-CM | POA: Diagnosis not present

## 2020-10-24 DIAGNOSIS — I2510 Atherosclerotic heart disease of native coronary artery without angina pectoris: Secondary | ICD-10-CM | POA: Diagnosis not present

## 2020-10-24 DIAGNOSIS — E1140 Type 2 diabetes mellitus with diabetic neuropathy, unspecified: Secondary | ICD-10-CM | POA: Diagnosis not present

## 2020-10-24 DIAGNOSIS — Z881 Allergy status to other antibiotic agents status: Secondary | ICD-10-CM | POA: Diagnosis not present

## 2020-10-24 DIAGNOSIS — R42 Dizziness and giddiness: Secondary | ICD-10-CM | POA: Diagnosis not present

## 2020-10-24 DIAGNOSIS — E1165 Type 2 diabetes mellitus with hyperglycemia: Secondary | ICD-10-CM | POA: Diagnosis not present

## 2020-10-24 DIAGNOSIS — E7800 Pure hypercholesterolemia, unspecified: Secondary | ICD-10-CM | POA: Diagnosis not present

## 2020-10-24 DIAGNOSIS — Z880 Allergy status to penicillin: Secondary | ICD-10-CM | POA: Diagnosis not present

## 2020-10-25 DIAGNOSIS — Z955 Presence of coronary angioplasty implant and graft: Secondary | ICD-10-CM | POA: Diagnosis not present

## 2020-10-25 DIAGNOSIS — I10 Essential (primary) hypertension: Secondary | ICD-10-CM | POA: Diagnosis not present

## 2020-10-25 DIAGNOSIS — E1165 Type 2 diabetes mellitus with hyperglycemia: Secondary | ICD-10-CM | POA: Diagnosis not present

## 2020-10-25 DIAGNOSIS — Z881 Allergy status to other antibiotic agents status: Secondary | ICD-10-CM | POA: Diagnosis not present

## 2020-10-25 DIAGNOSIS — I25118 Atherosclerotic heart disease of native coronary artery with other forms of angina pectoris: Secondary | ICD-10-CM | POA: Diagnosis not present

## 2020-10-25 DIAGNOSIS — I2510 Atherosclerotic heart disease of native coronary artery without angina pectoris: Secondary | ICD-10-CM | POA: Diagnosis not present

## 2020-10-25 DIAGNOSIS — R0602 Shortness of breath: Secondary | ICD-10-CM | POA: Diagnosis not present

## 2020-10-25 DIAGNOSIS — Z7982 Long term (current) use of aspirin: Secondary | ICD-10-CM | POA: Diagnosis not present

## 2020-10-25 DIAGNOSIS — R42 Dizziness and giddiness: Secondary | ICD-10-CM | POA: Diagnosis not present

## 2020-10-25 DIAGNOSIS — Z79899 Other long term (current) drug therapy: Secondary | ICD-10-CM | POA: Diagnosis not present

## 2020-10-25 DIAGNOSIS — Z880 Allergy status to penicillin: Secondary | ICD-10-CM | POA: Diagnosis not present

## 2020-10-28 DIAGNOSIS — R0602 Shortness of breath: Secondary | ICD-10-CM | POA: Diagnosis not present

## 2020-10-28 DIAGNOSIS — J4540 Moderate persistent asthma, uncomplicated: Secondary | ICD-10-CM | POA: Diagnosis not present

## 2020-10-28 DIAGNOSIS — J301 Allergic rhinitis due to pollen: Secondary | ICD-10-CM | POA: Diagnosis not present

## 2020-10-28 DIAGNOSIS — D860 Sarcoidosis of lung: Secondary | ICD-10-CM | POA: Diagnosis not present

## 2020-10-28 DIAGNOSIS — G4733 Obstructive sleep apnea (adult) (pediatric): Secondary | ICD-10-CM | POA: Diagnosis not present

## 2020-11-11 DIAGNOSIS — E785 Hyperlipidemia, unspecified: Secondary | ICD-10-CM | POA: Diagnosis not present

## 2020-11-11 DIAGNOSIS — I2510 Atherosclerotic heart disease of native coronary artery without angina pectoris: Secondary | ICD-10-CM | POA: Diagnosis not present

## 2020-11-11 DIAGNOSIS — I10 Essential (primary) hypertension: Secondary | ICD-10-CM | POA: Diagnosis not present

## 2020-11-11 DIAGNOSIS — Z955 Presence of coronary angioplasty implant and graft: Secondary | ICD-10-CM | POA: Diagnosis not present

## 2020-11-13 DIAGNOSIS — M25569 Pain in unspecified knee: Secondary | ICD-10-CM | POA: Diagnosis not present

## 2020-11-13 DIAGNOSIS — E785 Hyperlipidemia, unspecified: Secondary | ICD-10-CM | POA: Diagnosis not present

## 2020-11-13 DIAGNOSIS — M21961 Unspecified acquired deformity of right lower leg: Secondary | ICD-10-CM | POA: Diagnosis not present

## 2020-11-13 DIAGNOSIS — L570 Actinic keratosis: Secondary | ICD-10-CM | POA: Diagnosis not present

## 2020-11-13 DIAGNOSIS — I2510 Atherosclerotic heart disease of native coronary artery without angina pectoris: Secondary | ICD-10-CM | POA: Diagnosis not present

## 2020-11-13 DIAGNOSIS — C44629 Squamous cell carcinoma of skin of left upper limb, including shoulder: Secondary | ICD-10-CM | POA: Diagnosis not present

## 2020-11-13 DIAGNOSIS — Z9013 Acquired absence of bilateral breasts and nipples: Secondary | ICD-10-CM | POA: Diagnosis not present

## 2020-11-13 DIAGNOSIS — I10 Essential (primary) hypertension: Secondary | ICD-10-CM | POA: Diagnosis not present

## 2020-11-13 DIAGNOSIS — D0462 Carcinoma in situ of skin of left upper limb, including shoulder: Secondary | ICD-10-CM | POA: Diagnosis not present

## 2020-11-14 DIAGNOSIS — S86091A Other specified injury of right Achilles tendon, initial encounter: Secondary | ICD-10-CM | POA: Diagnosis not present

## 2020-11-18 DIAGNOSIS — C44629 Squamous cell carcinoma of skin of left upper limb, including shoulder: Secondary | ICD-10-CM | POA: Diagnosis not present

## 2020-11-18 DIAGNOSIS — I10 Essential (primary) hypertension: Secondary | ICD-10-CM | POA: Diagnosis not present

## 2020-11-18 DIAGNOSIS — G4733 Obstructive sleep apnea (adult) (pediatric): Secondary | ICD-10-CM | POA: Diagnosis not present

## 2020-11-18 DIAGNOSIS — I2510 Atherosclerotic heart disease of native coronary artery without angina pectoris: Secondary | ICD-10-CM | POA: Diagnosis not present

## 2020-11-18 DIAGNOSIS — E1149 Type 2 diabetes mellitus with other diabetic neurological complication: Secondary | ICD-10-CM | POA: Diagnosis not present

## 2020-11-18 DIAGNOSIS — E039 Hypothyroidism, unspecified: Secondary | ICD-10-CM | POA: Diagnosis not present

## 2020-11-18 DIAGNOSIS — G43909 Migraine, unspecified, not intractable, without status migrainosus: Secondary | ICD-10-CM | POA: Diagnosis not present

## 2020-11-18 DIAGNOSIS — K581 Irritable bowel syndrome with constipation: Secondary | ICD-10-CM | POA: Diagnosis not present

## 2020-11-18 DIAGNOSIS — N1831 Chronic kidney disease, stage 3a: Secondary | ICD-10-CM | POA: Diagnosis not present

## 2020-11-19 DIAGNOSIS — I10 Essential (primary) hypertension: Secondary | ICD-10-CM | POA: Diagnosis not present

## 2020-11-19 DIAGNOSIS — E785 Hyperlipidemia, unspecified: Secondary | ICD-10-CM | POA: Diagnosis not present

## 2020-11-19 DIAGNOSIS — I2510 Atherosclerotic heart disease of native coronary artery without angina pectoris: Secondary | ICD-10-CM | POA: Diagnosis not present

## 2020-11-19 DIAGNOSIS — M25569 Pain in unspecified knee: Secondary | ICD-10-CM | POA: Diagnosis not present

## 2020-11-19 DIAGNOSIS — M21961 Unspecified acquired deformity of right lower leg: Secondary | ICD-10-CM | POA: Diagnosis not present

## 2020-11-20 DIAGNOSIS — C44629 Squamous cell carcinoma of skin of left upper limb, including shoulder: Secondary | ICD-10-CM | POA: Diagnosis not present

## 2020-11-20 DIAGNOSIS — L578 Other skin changes due to chronic exposure to nonionizing radiation: Secondary | ICD-10-CM | POA: Diagnosis not present

## 2020-11-20 DIAGNOSIS — E119 Type 2 diabetes mellitus without complications: Secondary | ICD-10-CM | POA: Diagnosis not present

## 2020-11-20 DIAGNOSIS — L570 Actinic keratosis: Secondary | ICD-10-CM | POA: Diagnosis not present

## 2020-11-20 DIAGNOSIS — H524 Presbyopia: Secondary | ICD-10-CM | POA: Diagnosis not present

## 2020-11-26 DIAGNOSIS — H903 Sensorineural hearing loss, bilateral: Secondary | ICD-10-CM | POA: Diagnosis not present

## 2020-11-27 DIAGNOSIS — M9904 Segmental and somatic dysfunction of sacral region: Secondary | ICD-10-CM | POA: Diagnosis not present

## 2020-11-27 DIAGNOSIS — M4304 Spondylolysis, thoracic region: Secondary | ICD-10-CM | POA: Diagnosis not present

## 2020-11-27 DIAGNOSIS — M9902 Segmental and somatic dysfunction of thoracic region: Secondary | ICD-10-CM | POA: Diagnosis not present

## 2020-11-27 DIAGNOSIS — M4727 Other spondylosis with radiculopathy, lumbosacral region: Secondary | ICD-10-CM | POA: Diagnosis not present

## 2020-11-27 DIAGNOSIS — M9903 Segmental and somatic dysfunction of lumbar region: Secondary | ICD-10-CM | POA: Diagnosis not present

## 2020-11-27 DIAGNOSIS — M5388 Other specified dorsopathies, sacral and sacrococcygeal region: Secondary | ICD-10-CM | POA: Diagnosis not present

## 2020-12-02 DIAGNOSIS — I10 Essential (primary) hypertension: Secondary | ICD-10-CM | POA: Diagnosis not present

## 2020-12-02 DIAGNOSIS — M25569 Pain in unspecified knee: Secondary | ICD-10-CM | POA: Diagnosis not present

## 2020-12-02 DIAGNOSIS — I2510 Atherosclerotic heart disease of native coronary artery without angina pectoris: Secondary | ICD-10-CM | POA: Diagnosis not present

## 2020-12-02 DIAGNOSIS — C44629 Squamous cell carcinoma of skin of left upper limb, including shoulder: Secondary | ICD-10-CM | POA: Diagnosis not present

## 2020-12-02 DIAGNOSIS — M21961 Unspecified acquired deformity of right lower leg: Secondary | ICD-10-CM | POA: Diagnosis not present

## 2020-12-02 DIAGNOSIS — E785 Hyperlipidemia, unspecified: Secondary | ICD-10-CM | POA: Diagnosis not present

## 2020-12-02 DIAGNOSIS — D0462 Carcinoma in situ of skin of left upper limb, including shoulder: Secondary | ICD-10-CM | POA: Diagnosis not present

## 2020-12-05 DIAGNOSIS — I10 Essential (primary) hypertension: Secondary | ICD-10-CM | POA: Diagnosis not present

## 2020-12-05 DIAGNOSIS — M25569 Pain in unspecified knee: Secondary | ICD-10-CM | POA: Diagnosis not present

## 2020-12-05 DIAGNOSIS — I2510 Atherosclerotic heart disease of native coronary artery without angina pectoris: Secondary | ICD-10-CM | POA: Diagnosis not present

## 2020-12-05 DIAGNOSIS — M21961 Unspecified acquired deformity of right lower leg: Secondary | ICD-10-CM | POA: Diagnosis not present

## 2020-12-05 DIAGNOSIS — E785 Hyperlipidemia, unspecified: Secondary | ICD-10-CM | POA: Diagnosis not present

## 2020-12-09 DIAGNOSIS — E785 Hyperlipidemia, unspecified: Secondary | ICD-10-CM | POA: Diagnosis not present

## 2020-12-09 DIAGNOSIS — M25569 Pain in unspecified knee: Secondary | ICD-10-CM | POA: Diagnosis not present

## 2020-12-09 DIAGNOSIS — M21961 Unspecified acquired deformity of right lower leg: Secondary | ICD-10-CM | POA: Diagnosis not present

## 2020-12-09 DIAGNOSIS — I2510 Atherosclerotic heart disease of native coronary artery without angina pectoris: Secondary | ICD-10-CM | POA: Diagnosis not present

## 2020-12-09 DIAGNOSIS — I10 Essential (primary) hypertension: Secondary | ICD-10-CM | POA: Diagnosis not present

## 2020-12-13 DIAGNOSIS — I2510 Atherosclerotic heart disease of native coronary artery without angina pectoris: Secondary | ICD-10-CM | POA: Diagnosis not present

## 2020-12-13 DIAGNOSIS — E785 Hyperlipidemia, unspecified: Secondary | ICD-10-CM | POA: Diagnosis not present

## 2020-12-13 DIAGNOSIS — I10 Essential (primary) hypertension: Secondary | ICD-10-CM | POA: Diagnosis not present

## 2020-12-13 DIAGNOSIS — M25569 Pain in unspecified knee: Secondary | ICD-10-CM | POA: Diagnosis not present

## 2020-12-13 DIAGNOSIS — M21961 Unspecified acquired deformity of right lower leg: Secondary | ICD-10-CM | POA: Diagnosis not present

## 2020-12-16 DIAGNOSIS — M21961 Unspecified acquired deformity of right lower leg: Secondary | ICD-10-CM | POA: Diagnosis not present

## 2020-12-16 DIAGNOSIS — I2510 Atherosclerotic heart disease of native coronary artery without angina pectoris: Secondary | ICD-10-CM | POA: Diagnosis not present

## 2020-12-16 DIAGNOSIS — E785 Hyperlipidemia, unspecified: Secondary | ICD-10-CM | POA: Diagnosis not present

## 2020-12-16 DIAGNOSIS — I10 Essential (primary) hypertension: Secondary | ICD-10-CM | POA: Diagnosis not present

## 2020-12-16 DIAGNOSIS — M25569 Pain in unspecified knee: Secondary | ICD-10-CM | POA: Diagnosis not present

## 2020-12-19 DIAGNOSIS — M21961 Unspecified acquired deformity of right lower leg: Secondary | ICD-10-CM | POA: Diagnosis not present

## 2020-12-19 DIAGNOSIS — I2510 Atherosclerotic heart disease of native coronary artery without angina pectoris: Secondary | ICD-10-CM | POA: Diagnosis not present

## 2020-12-19 DIAGNOSIS — M25569 Pain in unspecified knee: Secondary | ICD-10-CM | POA: Diagnosis not present

## 2020-12-19 DIAGNOSIS — E785 Hyperlipidemia, unspecified: Secondary | ICD-10-CM | POA: Diagnosis not present

## 2020-12-19 DIAGNOSIS — I10 Essential (primary) hypertension: Secondary | ICD-10-CM | POA: Diagnosis not present

## 2020-12-20 DIAGNOSIS — E1149 Type 2 diabetes mellitus with other diabetic neurological complication: Secondary | ICD-10-CM | POA: Diagnosis not present

## 2020-12-20 DIAGNOSIS — D860 Sarcoidosis of lung: Secondary | ICD-10-CM | POA: Diagnosis not present

## 2020-12-20 DIAGNOSIS — Z13820 Encounter for screening for osteoporosis: Secondary | ICD-10-CM | POA: Diagnosis not present

## 2020-12-20 DIAGNOSIS — E1140 Type 2 diabetes mellitus with diabetic neuropathy, unspecified: Secondary | ICD-10-CM | POA: Diagnosis not present

## 2020-12-20 DIAGNOSIS — N1831 Chronic kidney disease, stage 3a: Secondary | ICD-10-CM | POA: Diagnosis not present

## 2020-12-20 DIAGNOSIS — Z0000 Encounter for general adult medical examination without abnormal findings: Secondary | ICD-10-CM | POA: Diagnosis not present

## 2020-12-20 DIAGNOSIS — E559 Vitamin D deficiency, unspecified: Secondary | ICD-10-CM | POA: Diagnosis not present

## 2020-12-20 DIAGNOSIS — Z794 Long term (current) use of insulin: Secondary | ICD-10-CM | POA: Diagnosis not present

## 2020-12-23 DIAGNOSIS — Z1231 Encounter for screening mammogram for malignant neoplasm of breast: Secondary | ICD-10-CM | POA: Diagnosis not present

## 2020-12-24 DIAGNOSIS — E785 Hyperlipidemia, unspecified: Secondary | ICD-10-CM | POA: Diagnosis not present

## 2020-12-24 DIAGNOSIS — Z1283 Encounter for screening for malignant neoplasm of skin: Secondary | ICD-10-CM | POA: Diagnosis not present

## 2020-12-24 DIAGNOSIS — Z85828 Personal history of other malignant neoplasm of skin: Secondary | ICD-10-CM | POA: Diagnosis not present

## 2020-12-24 DIAGNOSIS — L578 Other skin changes due to chronic exposure to nonionizing radiation: Secondary | ICD-10-CM | POA: Diagnosis not present

## 2020-12-24 DIAGNOSIS — M25569 Pain in unspecified knee: Secondary | ICD-10-CM | POA: Diagnosis not present

## 2020-12-24 DIAGNOSIS — D485 Neoplasm of uncertain behavior of skin: Secondary | ICD-10-CM | POA: Diagnosis not present

## 2020-12-24 DIAGNOSIS — D229 Melanocytic nevi, unspecified: Secondary | ICD-10-CM | POA: Diagnosis not present

## 2020-12-24 DIAGNOSIS — L821 Other seborrheic keratosis: Secondary | ICD-10-CM | POA: Diagnosis not present

## 2020-12-24 DIAGNOSIS — I10 Essential (primary) hypertension: Secondary | ICD-10-CM | POA: Diagnosis not present

## 2020-12-24 DIAGNOSIS — D1801 Hemangioma of skin and subcutaneous tissue: Secondary | ICD-10-CM | POA: Diagnosis not present

## 2020-12-24 DIAGNOSIS — L570 Actinic keratosis: Secondary | ICD-10-CM | POA: Diagnosis not present

## 2020-12-24 DIAGNOSIS — M21961 Unspecified acquired deformity of right lower leg: Secondary | ICD-10-CM | POA: Diagnosis not present

## 2020-12-24 DIAGNOSIS — E1149 Type 2 diabetes mellitus with other diabetic neurological complication: Secondary | ICD-10-CM | POA: Diagnosis not present

## 2020-12-24 DIAGNOSIS — I2510 Atherosclerotic heart disease of native coronary artery without angina pectoris: Secondary | ICD-10-CM | POA: Diagnosis not present

## 2021-01-01 DIAGNOSIS — L539 Erythematous condition, unspecified: Secondary | ICD-10-CM | POA: Diagnosis not present

## 2021-01-01 DIAGNOSIS — M79661 Pain in right lower leg: Secondary | ICD-10-CM | POA: Diagnosis not present

## 2021-01-05 HISTORY — PX: REPLACEMENT TOTAL KNEE: SUR1224

## 2021-01-05 HISTORY — PX: OTHER SURGICAL HISTORY: SHX169

## 2021-01-13 DIAGNOSIS — G4733 Obstructive sleep apnea (adult) (pediatric): Secondary | ICD-10-CM | POA: Diagnosis not present

## 2021-01-13 DIAGNOSIS — M170 Bilateral primary osteoarthritis of knee: Secondary | ICD-10-CM | POA: Diagnosis not present

## 2021-01-13 DIAGNOSIS — I2510 Atherosclerotic heart disease of native coronary artery without angina pectoris: Secondary | ICD-10-CM | POA: Diagnosis not present

## 2021-01-13 DIAGNOSIS — E785 Hyperlipidemia, unspecified: Secondary | ICD-10-CM | POA: Diagnosis not present

## 2021-01-13 DIAGNOSIS — J8289 Other pulmonary eosinophilia, not elsewhere classified: Secondary | ICD-10-CM | POA: Diagnosis not present

## 2021-01-13 DIAGNOSIS — M21961 Unspecified acquired deformity of right lower leg: Secondary | ICD-10-CM | POA: Diagnosis not present

## 2021-01-13 DIAGNOSIS — J4540 Moderate persistent asthma, uncomplicated: Secondary | ICD-10-CM | POA: Diagnosis not present

## 2021-01-13 DIAGNOSIS — D860 Sarcoidosis of lung: Secondary | ICD-10-CM | POA: Diagnosis not present

## 2021-01-16 DIAGNOSIS — E785 Hyperlipidemia, unspecified: Secondary | ICD-10-CM | POA: Diagnosis not present

## 2021-01-16 DIAGNOSIS — M21961 Unspecified acquired deformity of right lower leg: Secondary | ICD-10-CM | POA: Diagnosis not present

## 2021-01-16 DIAGNOSIS — I2510 Atherosclerotic heart disease of native coronary artery without angina pectoris: Secondary | ICD-10-CM | POA: Diagnosis not present

## 2021-01-20 DIAGNOSIS — E785 Hyperlipidemia, unspecified: Secondary | ICD-10-CM | POA: Diagnosis not present

## 2021-01-20 DIAGNOSIS — R0602 Shortness of breath: Secondary | ICD-10-CM | POA: Diagnosis not present

## 2021-01-20 DIAGNOSIS — R9439 Abnormal result of other cardiovascular function study: Secondary | ICD-10-CM | POA: Diagnosis not present

## 2021-01-20 DIAGNOSIS — M21961 Unspecified acquired deformity of right lower leg: Secondary | ICD-10-CM | POA: Diagnosis not present

## 2021-01-20 DIAGNOSIS — I2510 Atherosclerotic heart disease of native coronary artery without angina pectoris: Secondary | ICD-10-CM | POA: Diagnosis not present

## 2021-01-23 DIAGNOSIS — I2510 Atherosclerotic heart disease of native coronary artery without angina pectoris: Secondary | ICD-10-CM | POA: Diagnosis not present

## 2021-01-23 DIAGNOSIS — M21961 Unspecified acquired deformity of right lower leg: Secondary | ICD-10-CM | POA: Diagnosis not present

## 2021-01-23 DIAGNOSIS — E785 Hyperlipidemia, unspecified: Secondary | ICD-10-CM | POA: Diagnosis not present

## 2021-01-28 DIAGNOSIS — K635 Polyp of colon: Secondary | ICD-10-CM | POA: Diagnosis not present

## 2021-01-28 DIAGNOSIS — K649 Unspecified hemorrhoids: Secondary | ICD-10-CM | POA: Diagnosis not present

## 2021-01-28 DIAGNOSIS — K219 Gastro-esophageal reflux disease without esophagitis: Secondary | ICD-10-CM | POA: Diagnosis not present

## 2021-01-28 DIAGNOSIS — K5909 Other constipation: Secondary | ICD-10-CM | POA: Diagnosis not present

## 2021-01-29 DIAGNOSIS — Z794 Long term (current) use of insulin: Secondary | ICD-10-CM | POA: Diagnosis not present

## 2021-01-29 DIAGNOSIS — I2510 Atherosclerotic heart disease of native coronary artery without angina pectoris: Secondary | ICD-10-CM | POA: Diagnosis not present

## 2021-01-29 DIAGNOSIS — E1149 Type 2 diabetes mellitus with other diabetic neurological complication: Secondary | ICD-10-CM | POA: Diagnosis not present

## 2021-01-29 DIAGNOSIS — G4733 Obstructive sleep apnea (adult) (pediatric): Secondary | ICD-10-CM | POA: Diagnosis not present

## 2021-01-29 DIAGNOSIS — N1831 Chronic kidney disease, stage 3a: Secondary | ICD-10-CM | POA: Diagnosis not present

## 2021-01-29 DIAGNOSIS — E1140 Type 2 diabetes mellitus with diabetic neuropathy, unspecified: Secondary | ICD-10-CM | POA: Diagnosis not present

## 2021-01-29 DIAGNOSIS — E8881 Metabolic syndrome: Secondary | ICD-10-CM | POA: Diagnosis not present

## 2021-01-30 DIAGNOSIS — I2510 Atherosclerotic heart disease of native coronary artery without angina pectoris: Secondary | ICD-10-CM | POA: Diagnosis not present

## 2021-01-30 DIAGNOSIS — E785 Hyperlipidemia, unspecified: Secondary | ICD-10-CM | POA: Diagnosis not present

## 2021-01-30 DIAGNOSIS — M21961 Unspecified acquired deformity of right lower leg: Secondary | ICD-10-CM | POA: Diagnosis not present

## 2021-02-03 DIAGNOSIS — I2510 Atherosclerotic heart disease of native coronary artery without angina pectoris: Secondary | ICD-10-CM | POA: Diagnosis not present

## 2021-02-03 DIAGNOSIS — M21961 Unspecified acquired deformity of right lower leg: Secondary | ICD-10-CM | POA: Diagnosis not present

## 2021-02-03 DIAGNOSIS — E785 Hyperlipidemia, unspecified: Secondary | ICD-10-CM | POA: Diagnosis not present

## 2021-02-10 DIAGNOSIS — I10 Essential (primary) hypertension: Secondary | ICD-10-CM | POA: Diagnosis not present

## 2021-02-10 DIAGNOSIS — I2510 Atherosclerotic heart disease of native coronary artery without angina pectoris: Secondary | ICD-10-CM | POA: Diagnosis not present

## 2021-02-10 DIAGNOSIS — E1159 Type 2 diabetes mellitus with other circulatory complications: Secondary | ICD-10-CM | POA: Diagnosis not present

## 2021-02-10 DIAGNOSIS — Z955 Presence of coronary angioplasty implant and graft: Secondary | ICD-10-CM | POA: Diagnosis not present

## 2021-02-13 DIAGNOSIS — R69 Illness, unspecified: Secondary | ICD-10-CM | POA: Diagnosis not present

## 2021-02-17 DIAGNOSIS — R0981 Nasal congestion: Secondary | ICD-10-CM | POA: Diagnosis not present

## 2021-02-17 DIAGNOSIS — M1711 Unilateral primary osteoarthritis, right knee: Secondary | ICD-10-CM | POA: Diagnosis not present

## 2021-02-21 DIAGNOSIS — E1149 Type 2 diabetes mellitus with other diabetic neurological complication: Secondary | ICD-10-CM | POA: Diagnosis not present

## 2021-02-21 DIAGNOSIS — G43909 Migraine, unspecified, not intractable, without status migrainosus: Secondary | ICD-10-CM | POA: Diagnosis not present

## 2021-02-21 DIAGNOSIS — E785 Hyperlipidemia, unspecified: Secondary | ICD-10-CM | POA: Diagnosis not present

## 2021-02-21 DIAGNOSIS — E1140 Type 2 diabetes mellitus with diabetic neuropathy, unspecified: Secondary | ICD-10-CM | POA: Diagnosis not present

## 2021-02-21 DIAGNOSIS — E039 Hypothyroidism, unspecified: Secondary | ICD-10-CM | POA: Diagnosis not present

## 2021-02-21 DIAGNOSIS — I10 Essential (primary) hypertension: Secondary | ICD-10-CM | POA: Diagnosis not present

## 2021-02-21 DIAGNOSIS — G4733 Obstructive sleep apnea (adult) (pediatric): Secondary | ICD-10-CM | POA: Diagnosis not present

## 2021-02-21 DIAGNOSIS — F419 Anxiety disorder, unspecified: Secondary | ICD-10-CM | POA: Diagnosis not present

## 2021-02-21 DIAGNOSIS — I2510 Atherosclerotic heart disease of native coronary artery without angina pectoris: Secondary | ICD-10-CM | POA: Diagnosis not present

## 2021-03-05 DIAGNOSIS — M21961 Unspecified acquired deformity of right lower leg: Secondary | ICD-10-CM | POA: Diagnosis not present

## 2021-03-10 DIAGNOSIS — F419 Anxiety disorder, unspecified: Secondary | ICD-10-CM | POA: Diagnosis not present

## 2021-03-10 DIAGNOSIS — J301 Allergic rhinitis due to pollen: Secondary | ICD-10-CM | POA: Diagnosis not present

## 2021-03-10 DIAGNOSIS — Z0100 Encounter for examination of eyes and vision without abnormal findings: Secondary | ICD-10-CM | POA: Diagnosis not present

## 2021-03-10 DIAGNOSIS — M1711 Unilateral primary osteoarthritis, right knee: Secondary | ICD-10-CM | POA: Diagnosis not present

## 2021-03-10 DIAGNOSIS — M21961 Unspecified acquired deformity of right lower leg: Secondary | ICD-10-CM | POA: Diagnosis not present

## 2021-03-10 DIAGNOSIS — M7041 Prepatellar bursitis, right knee: Secondary | ICD-10-CM | POA: Diagnosis not present

## 2021-03-10 DIAGNOSIS — D62 Acute posthemorrhagic anemia: Secondary | ICD-10-CM | POA: Diagnosis not present

## 2021-03-10 DIAGNOSIS — L570 Actinic keratosis: Secondary | ICD-10-CM | POA: Diagnosis not present

## 2021-03-10 DIAGNOSIS — I2510 Atherosclerotic heart disease of native coronary artery without angina pectoris: Secondary | ICD-10-CM | POA: Diagnosis not present

## 2021-03-10 DIAGNOSIS — E1140 Type 2 diabetes mellitus with diabetic neuropathy, unspecified: Secondary | ICD-10-CM | POA: Diagnosis not present

## 2021-03-12 DIAGNOSIS — J301 Allergic rhinitis due to pollen: Secondary | ICD-10-CM | POA: Diagnosis not present

## 2021-03-12 DIAGNOSIS — L570 Actinic keratosis: Secondary | ICD-10-CM | POA: Diagnosis not present

## 2021-03-12 DIAGNOSIS — D62 Acute posthemorrhagic anemia: Secondary | ICD-10-CM | POA: Diagnosis not present

## 2021-03-12 DIAGNOSIS — M21961 Unspecified acquired deformity of right lower leg: Secondary | ICD-10-CM | POA: Diagnosis not present

## 2021-03-12 DIAGNOSIS — Z96651 Presence of right artificial knee joint: Secondary | ICD-10-CM | POA: Diagnosis not present

## 2021-03-12 DIAGNOSIS — E1140 Type 2 diabetes mellitus with diabetic neuropathy, unspecified: Secondary | ICD-10-CM | POA: Diagnosis not present

## 2021-03-12 DIAGNOSIS — F419 Anxiety disorder, unspecified: Secondary | ICD-10-CM | POA: Diagnosis not present

## 2021-03-12 DIAGNOSIS — G8918 Other acute postprocedural pain: Secondary | ICD-10-CM | POA: Diagnosis not present

## 2021-03-12 DIAGNOSIS — M7041 Prepatellar bursitis, right knee: Secondary | ICD-10-CM | POA: Diagnosis not present

## 2021-03-12 DIAGNOSIS — M1711 Unilateral primary osteoarthritis, right knee: Secondary | ICD-10-CM | POA: Diagnosis not present

## 2021-03-12 DIAGNOSIS — I2510 Atherosclerotic heart disease of native coronary artery without angina pectoris: Secondary | ICD-10-CM | POA: Diagnosis not present

## 2021-03-12 DIAGNOSIS — M7121 Synovial cyst of popliteal space [Baker], right knee: Secondary | ICD-10-CM | POA: Diagnosis not present

## 2021-03-13 DIAGNOSIS — D62 Acute posthemorrhagic anemia: Secondary | ICD-10-CM | POA: Diagnosis not present

## 2021-03-13 DIAGNOSIS — J301 Allergic rhinitis due to pollen: Secondary | ICD-10-CM | POA: Diagnosis not present

## 2021-03-13 DIAGNOSIS — M1711 Unilateral primary osteoarthritis, right knee: Secondary | ICD-10-CM | POA: Diagnosis not present

## 2021-03-13 DIAGNOSIS — I2510 Atherosclerotic heart disease of native coronary artery without angina pectoris: Secondary | ICD-10-CM | POA: Diagnosis not present

## 2021-03-13 DIAGNOSIS — E1140 Type 2 diabetes mellitus with diabetic neuropathy, unspecified: Secondary | ICD-10-CM | POA: Diagnosis not present

## 2021-03-13 DIAGNOSIS — F419 Anxiety disorder, unspecified: Secondary | ICD-10-CM | POA: Diagnosis not present

## 2021-03-13 DIAGNOSIS — L570 Actinic keratosis: Secondary | ICD-10-CM | POA: Diagnosis not present

## 2021-03-13 DIAGNOSIS — M21961 Unspecified acquired deformity of right lower leg: Secondary | ICD-10-CM | POA: Diagnosis not present

## 2021-03-13 DIAGNOSIS — M7041 Prepatellar bursitis, right knee: Secondary | ICD-10-CM | POA: Diagnosis not present

## 2021-03-13 DIAGNOSIS — G8918 Other acute postprocedural pain: Secondary | ICD-10-CM | POA: Diagnosis not present

## 2021-03-14 DIAGNOSIS — L570 Actinic keratosis: Secondary | ICD-10-CM | POA: Diagnosis not present

## 2021-03-14 DIAGNOSIS — M21961 Unspecified acquired deformity of right lower leg: Secondary | ICD-10-CM | POA: Diagnosis not present

## 2021-03-14 DIAGNOSIS — I2510 Atherosclerotic heart disease of native coronary artery without angina pectoris: Secondary | ICD-10-CM | POA: Diagnosis not present

## 2021-03-14 DIAGNOSIS — J301 Allergic rhinitis due to pollen: Secondary | ICD-10-CM | POA: Diagnosis not present

## 2021-03-14 DIAGNOSIS — G8918 Other acute postprocedural pain: Secondary | ICD-10-CM | POA: Diagnosis not present

## 2021-03-14 DIAGNOSIS — F419 Anxiety disorder, unspecified: Secondary | ICD-10-CM | POA: Diagnosis not present

## 2021-03-14 DIAGNOSIS — M7041 Prepatellar bursitis, right knee: Secondary | ICD-10-CM | POA: Diagnosis not present

## 2021-03-14 DIAGNOSIS — D62 Acute posthemorrhagic anemia: Secondary | ICD-10-CM | POA: Diagnosis not present

## 2021-03-14 DIAGNOSIS — E1140 Type 2 diabetes mellitus with diabetic neuropathy, unspecified: Secondary | ICD-10-CM | POA: Diagnosis not present

## 2021-03-14 DIAGNOSIS — M1711 Unilateral primary osteoarthritis, right knee: Secondary | ICD-10-CM | POA: Diagnosis not present

## 2021-03-15 DIAGNOSIS — E039 Hypothyroidism, unspecified: Secondary | ICD-10-CM | POA: Diagnosis not present

## 2021-03-15 DIAGNOSIS — Z471 Aftercare following joint replacement surgery: Secondary | ICD-10-CM | POA: Diagnosis not present

## 2021-03-15 DIAGNOSIS — J45909 Unspecified asthma, uncomplicated: Secondary | ICD-10-CM | POA: Diagnosis not present

## 2021-03-15 DIAGNOSIS — L570 Actinic keratosis: Secondary | ICD-10-CM | POA: Diagnosis not present

## 2021-03-15 DIAGNOSIS — I10 Essential (primary) hypertension: Secondary | ICD-10-CM | POA: Diagnosis not present

## 2021-03-15 DIAGNOSIS — I2510 Atherosclerotic heart disease of native coronary artery without angina pectoris: Secondary | ICD-10-CM | POA: Diagnosis not present

## 2021-03-15 DIAGNOSIS — E1140 Type 2 diabetes mellitus with diabetic neuropathy, unspecified: Secondary | ICD-10-CM | POA: Diagnosis not present

## 2021-03-15 DIAGNOSIS — G8929 Other chronic pain: Secondary | ICD-10-CM | POA: Diagnosis not present

## 2021-03-15 DIAGNOSIS — D860 Sarcoidosis of lung: Secondary | ICD-10-CM | POA: Diagnosis not present

## 2021-03-17 DIAGNOSIS — M1711 Unilateral primary osteoarthritis, right knee: Secondary | ICD-10-CM | POA: Diagnosis not present

## 2021-03-31 DIAGNOSIS — D6869 Other thrombophilia: Secondary | ICD-10-CM | POA: Diagnosis not present

## 2022-06-23 ENCOUNTER — Ambulatory Visit: Payer: Medicare HMO | Admitting: Podiatry

## 2022-06-23 DIAGNOSIS — E1142 Type 2 diabetes mellitus with diabetic polyneuropathy: Secondary | ICD-10-CM

## 2022-06-23 DIAGNOSIS — M79675 Pain in left toe(s): Secondary | ICD-10-CM | POA: Diagnosis not present

## 2022-06-23 DIAGNOSIS — M79674 Pain in right toe(s): Secondary | ICD-10-CM

## 2022-06-23 DIAGNOSIS — B351 Tinea unguium: Secondary | ICD-10-CM

## 2022-06-23 DIAGNOSIS — M2142 Flat foot [pes planus] (acquired), left foot: Secondary | ICD-10-CM

## 2022-06-23 DIAGNOSIS — M2141 Flat foot [pes planus] (acquired), right foot: Secondary | ICD-10-CM

## 2022-06-23 NOTE — Progress Notes (Signed)
.   Subjective:  Patient ID: Melanie Meyers, female    DOB: 1955-10-01,  MRN: [id number]  Chief Complaint  Patient presents with   Nail Problem    Diabetic Foot Care- nail trim    Foot Orthotics    Patient wants diabetic shoes     67 y.o. female presents with the above complaint. History confirmed with patient. Patient presenting with pain related to dystrophic thickened elongated nails. Patient is unable to trim own nails related to nail dystrophy and/or mobility issues. Patient does  have a history of T2DM.   Objective:  Physical Exam: warm, good capillary refill nail exam onychomycosis of the toenails, onycholysis, and dystrophic nails DP pulses palpable, PT pulses palpable, and protective sensation absent Left Foot:  Pain with palpation of nails due to elongation and dystrophic growth.  Pes planus foot deformity Right Foot: Pain with palpation of nails due to elongation and dystrophic growth.  Pes planus foot deformity  Assessment:   1. Pain due to onychomycosis of toenails of both feet   2. Pes planus of both feet   3. DM type 2 with diabetic peripheral neuropathy (HCC)      Plan:  Patient was evaluated and treated and all questions answered.  # DM2 with neuropathy, pes planus -Recommend diabetic shoes and inserts to be manufactured to prevent pressure lesions and ulcerations from developing given neuropathy with pes planus deformity  #Onychomycosis with pain  -Nails palliatively debrided as below. -Educated on self-care  Procedure: Nail Debridement Rationale: Pain Type of Debridement: manual, sharp debridement. Instrumentation: Nail nipper, rotary burr. Number of Nails: 10  Return in about 3 months (around 09/23/2022) for [facility].         Corinna Gab, DPM [facility] / [facility]

## 2022-07-14 ENCOUNTER — Other Ambulatory Visit: Payer: Medicare HMO

## 2022-08-04 ENCOUNTER — Ambulatory Visit (INDEPENDENT_AMBULATORY_CARE_PROVIDER_SITE_OTHER): Payer: Medicare HMO | Admitting: Podiatry

## 2022-08-04 ENCOUNTER — Ambulatory Visit (INDEPENDENT_AMBULATORY_CARE_PROVIDER_SITE_OTHER): Payer: Medicare HMO

## 2022-08-04 DIAGNOSIS — M778 Other enthesopathies, not elsewhere classified: Secondary | ICD-10-CM

## 2022-08-04 DIAGNOSIS — M84374A Stress fracture, right foot, initial encounter for fracture: Secondary | ICD-10-CM | POA: Diagnosis not present

## 2022-08-04 DIAGNOSIS — M84375A Stress fracture, left foot, initial encounter for fracture: Secondary | ICD-10-CM

## 2022-08-04 NOTE — Progress Notes (Signed)
Subjective:  Patient ID: Melanie Meyers, female    DOB: June 12, 1955,  MRN: [id number]  No chief complaint on file.   67 y.o. female presents with concern for pain in her right foot.  She says she has an aching pain anytime she walks on the outside and on the top of her right foot.  She does relate a incidence approximately 1 week ago where she did a high volume of walking almost 9000 steps which is way more than she normally does on concrete surfaces at a hospital in [place].  She states that since then she has had significant aching pain in the right foot anytime she walks or steps down.  Past Medical History:  Diagnosis Date   Asthma    B12 deficiency    Back pain    CAD (coronary artery disease) 11/10/2017   Carpal tunnel syndrome of right wrist 01/15/2012   Chronic tension-type headache, not intractable 07/10/2016   Degenerative arthritis    degenerative arthritis of neck   Depression    Diabetic peripheral neuropathy (HCC) 05/17/2016   Fibromyalgia    Generalized anxiety disorder    GERD (gastroesophageal reflux disease)    HTN (hypertension) 06/20/2015   Hyperlipidemia 06/20/2015   Hypertension    Hypothyroidism 06/02/2013   Insomnia    Major depressive disorder, recurrent episode with anxious distress (HCC)    Obstructive sleep apnea    On CPAP   Raynaud's disease    Per patient   Sarcoidosis    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 (HCC) 02/10/2013   Transient alteration of awareness 10/10/2015   Type II Diabetes Mellitus without complication (HCC)    Vitamin D deficiency     Allergies  Allergen Reactions   Accupril [Quinapril Hcl] Cough   Prednisone Other (See Comments)    Has to be cautious due to Blood Sugar   Levaquin [Levofloxacin] Rash   Penicillin G Nausea And Vomiting, Nausea Only and Other (See Comments)    ROS: Negative except as per HPI above  Objective:  General: AAO x3, NAD  Dermatological: With inspection and palpation of  the right and left lower extremities there are no open sores, no preulcerative lesions, no rash or signs of infection present. Nails are of normal length thickness and coloration.   Vascular:  Dorsalis Pedis artery and Posterior Tibial artery pedal pulses are 2/4 bilateral.  Capillary fill time < 3 sec to all digits.   Neruologic: Grossly intact via light touch bilateral. Protective threshold intact to all sites bilateral.   Musculoskeletal: Focal pain on palpation along the proximal aspect of the fourth metatarsal near the base on the right foot.  No significant edema noted at this area  Gait: Unassisted, Nonantalgic.   No images are attached to the encounter.  Radiographs:  Date: 08/04/2022 XR right foot weightbearing AP/Lateral/Oblique   Findings: Tension directed the fourth metatarsal proximal base/metaphyseal diaphyseal junction there is questionable radiolucency at the medial lateral cortex concerning for possible stress fracture.  This is best observed on oblique view.  Corresponds with patient's area of focal pain on palpation Assessment:   1. Stress fracture of right foot, initial encounter   2. Capsulitis of foot, right      Plan:  Patient was evaluated and treated and all questions answered.  # Stress fracture right foot fourth metatarsal base -Discussed with patient she does have evidence of having stress fracture which would make sense given her history of high-volume walking recently. -I recommend  that we proceed with immobilization in a short cam boot for the right foot for the next 4 to 6 weeks. -Recommend supplementation with calcium and vitamin D. -Adjust activity level to decrease weightbearing time per day  Return in about 4 weeks (around 09/01/2022) for f/u R 4th met stress fx.          Corinna Gab, DPM [facility] / [facility]

## 2022-09-01 ENCOUNTER — Ambulatory Visit: Payer: Medicare HMO

## 2022-09-01 ENCOUNTER — Ambulatory Visit: Payer: Medicare HMO | Admitting: Podiatry

## 2022-09-01 DIAGNOSIS — M84374D Stress fracture, right foot, subsequent encounter for fracture with routine healing: Secondary | ICD-10-CM | POA: Diagnosis not present

## 2022-09-01 DIAGNOSIS — M84374A Stress fracture, right foot, initial encounter for fracture: Secondary | ICD-10-CM

## 2022-09-01 NOTE — Progress Notes (Signed)
Subjective:  Patient ID: Melanie Meyers, female    DOB: 02-08-1955,  MRN: [id number]  Chief Complaint  Patient presents with   Follow-up    f/u right 4th met stress fx. Pain is better but still has some soreness to the same area. She is wearing the cam boot when she is walking.     67 y.o. female presents for follow-up on right fourth metatarsal stress fracture.  She states that her pain is decreased she still has some soreness occasionally but it is much better than it was prior.  She has been using a cam boot for immobilization.  Past Medical History:  Diagnosis Date   Asthma    B12 deficiency    Back pain    CAD (coronary artery disease) 11/10/2017   Carpal tunnel syndrome of right wrist 01/15/2012   Chronic tension-type headache, not intractable 07/10/2016   Degenerative arthritis    degenerative arthritis of neck   Depression    Diabetic peripheral neuropathy (HCC) 05/17/2016   Fibromyalgia    Generalized anxiety disorder    GERD (gastroesophageal reflux disease)    HTN (hypertension) 06/20/2015   Hyperlipidemia 06/20/2015   Hypertension    Hypothyroidism 06/02/2013   Insomnia    Major depressive disorder, recurrent episode with anxious distress (HCC)    Obstructive sleep apnea    On CPAP   Raynaud's disease    Per patient   Sarcoidosis    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 (HCC) 02/10/2013   Transient alteration of awareness 10/10/2015   Type II Diabetes Mellitus without complication (HCC)    Vitamin D deficiency     Allergies  Allergen Reactions   Accupril [Quinapril Hcl] Cough   Prednisone Other (See Comments)    Has to be cautious due to Blood Sugar   Levaquin [Levofloxacin] Rash   Penicillin G Nausea And Vomiting, Nausea Only and Other (See Comments)    ROS: Negative except as per HPI above  Objective:  General: AAO x3, NAD  Dermatological: With inspection and palpation of the right and left lower extremities there are no open sores,  no preulcerative lesions, no rash or signs of infection present. Nails are of normal length thickness and coloration.   Vascular:  Dorsalis Pedis artery and Posterior Tibial artery pedal pulses are 2/4 bilateral.  Capillary fill time < 3 sec to all digits.   Neruologic: Grossly intact via light touch bilateral. Protective threshold intact to all sites bilateral.   Musculoskeletal: Focal pain on palpation along the proximal aspect of the fourth metatarsal near the base on the right foot.  No significant edema noted at this area  Gait: Unassisted, Nonantalgic.   No images are attached to the encounter.  Radiographs:  Date: 09/01/2022 XR right foot weightbearing AP/Lateral/Oblique   Findings: directed the fourth metatarsal proximal base/metaphyseal diaphyseal junction there is no longer obvious questionable radiolucency at the medial lateral cortex improved from prior  Assessment:   1. Stress fracture of right foot with routine healing, subsequent encounter      Plan:  Patient was evaluated and treated and all questions answered.  # Stress fracture right foot fourth metatarsal base -Improved appearance of the fourth metatarsal base on x-ray as well as clinically improved -I recommend that we proceed with additional immobilization in cam boot for another 2 weeks followed by getting back to regular good supportive shoes for another 4 weeks before him and see me -Recommend continued supplementation with calcium and vitamin D. -Adjust  activity level to decrease weightbearing time per day  No follow-ups on file.          Corinna Gab, DPM [facility] / [facility]

## 2022-09-21 ENCOUNTER — Ambulatory Visit: Payer: Medicare HMO | Admitting: Podiatry

## 2022-09-29 ENCOUNTER — Other Ambulatory Visit: Payer: Medicare HMO

## 2022-09-30 LAB — LAB REPORT - SCANNED
A1c: 5.5
EGFR: 61.2

## 2022-10-13 ENCOUNTER — Ambulatory Visit: Payer: Medicare HMO

## 2022-10-13 ENCOUNTER — Encounter: Payer: Self-pay | Admitting: Podiatry

## 2022-10-13 ENCOUNTER — Ambulatory Visit: Payer: Medicare HMO | Admitting: Podiatry

## 2022-10-13 DIAGNOSIS — M84374D Stress fracture, right foot, subsequent encounter for fracture with routine healing: Secondary | ICD-10-CM | POA: Diagnosis not present

## 2022-10-13 DIAGNOSIS — E1142 Type 2 diabetes mellitus with diabetic polyneuropathy: Secondary | ICD-10-CM

## 2022-10-13 DIAGNOSIS — M2141 Flat foot [pes planus] (acquired), right foot: Secondary | ICD-10-CM

## 2022-10-13 NOTE — Progress Notes (Signed)
Subjective:  Patient ID: Melanie Meyers, female    DOB: 1955-01-11,  MRN: [id number]  Chief Complaint  Patient presents with   Fracture    F/up R 4th met stress fracture. Doing better back in regular shoes. Pain resolved but does notice it occasionally. Presenting with cane "I feel wobbly"    67 y.o. female presents for follow-up on right fourth metatarsal stress fracture.  Reports much improved from prior.  Had previously been in cam boot.  She is now back in regular shoes.  Able to get around without any pain walking but only notices an issue if she overdoes it in terms of distance or the duration  Past Medical History:  Diagnosis Date   Asthma    B12 deficiency    Back pain    CAD (coronary artery disease) 11/10/2017   Carpal tunnel syndrome of right wrist 01/15/2012   Chronic tension-type headache, not intractable 07/10/2016   Degenerative arthritis    degenerative arthritis of neck   Depression    Diabetic peripheral neuropathy (HCC) 05/17/2016   Fibromyalgia    Generalized anxiety disorder    GERD (gastroesophageal reflux disease)    HTN (hypertension) 06/20/2015   Hyperlipidemia 06/20/2015   Hypertension    Hypothyroidism 06/02/2013   Insomnia    Major depressive disorder, recurrent episode with anxious distress (HCC)    Obstructive sleep apnea    On CPAP   Raynaud's disease    Per patient   Sarcoidosis    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 (HCC) 02/10/2013   Transient alteration of awareness 10/10/2015   Type II Diabetes Mellitus without complication (HCC)    Vitamin D deficiency     Allergies  Allergen Reactions   Accupril [Quinapril Hcl] Cough   Prednisone Other (See Comments)    Has to be cautious due to Blood Sugar   Levaquin [Levofloxacin] Rash   Penicillin G Nausea And Vomiting, Nausea Only and Other (See Comments)    ROS: Negative except as per HPI above  Objective:  General: AAO x3, NAD  Dermatological: With inspection and  palpation of the right and left lower extremities there are no open sores, no preulcerative lesions, no rash or signs of infection present. Nails are of normal length thickness and coloration.   Vascular:  Dorsalis Pedis artery and Posterior Tibial artery pedal pulses are 2/4 bilateral.  Capillary fill time < 3 sec to all digits.   Neruologic: Grossly intact via light touch bilateral. Protective threshold intact to all sites bilateral.   Musculoskeletal: No pain on palpation along the proximal aspect of the fourth metatarsal near the base on the right foot.  No significant edema noted at this area.  Overall improvement prior  Gait: Unassisted, Nonantalgic.   No images are attached to the encounter.  Radiographs:  Deferred at this visit Assessment:   1. Stress fracture of right foot with routine healing, subsequent encounter       Plan:  Patient was evaluated and treated and all questions answered.  # Stress fracture right foot fourth metatarsal base -Improved clinically at this time -Continue weightbearing as tolerated in regular shoe gear. -Recommend continued supplementation with calcium and vitamin D. -Adjust activity level to decrease weightbearing time per day  Return if symptoms worsen or fail to improve.          Corinna Gab, DPM [facility] / [facility]

## 2022-10-13 NOTE — Progress Notes (Signed)
Patient presents to the office today for diabetic shoe and insole measuring.  Patient was measured with brannock device to determine size and width for 1 pair of extra depth shoes and foam casted for 3 pair of insoles.   Documentation of medical necessity will be sent to patient's treating diabetic doctor to verify and sign.   Patient's diabetic provider: Dr Lulu Riding DO  Shoes and insoles will be ordered at that time and patient will be notified for an appointment for fitting when they arrive.   Shoe size (per patient): 7.5 Brannock measurement: 7.5 Patient shoe selection- Shoe choice:   Black Brooks  / 2nd white  `                                                [phone]  Shoe size ordered: 8W ABN and Financials signed

## 2022-12-01 ENCOUNTER — Ambulatory Visit: Payer: Medicare HMO | Admitting: Podiatry

## 2022-12-08 ENCOUNTER — Ambulatory Visit (INDEPENDENT_AMBULATORY_CARE_PROVIDER_SITE_OTHER): Payer: Medicare HMO

## 2022-12-08 DIAGNOSIS — E1142 Type 2 diabetes mellitus with diabetic polyneuropathy: Secondary | ICD-10-CM

## 2022-12-08 DIAGNOSIS — M2141 Flat foot [pes planus] (acquired), right foot: Secondary | ICD-10-CM | POA: Diagnosis not present

## 2022-12-08 DIAGNOSIS — M2142 Flat foot [pes planus] (acquired), left foot: Secondary | ICD-10-CM | POA: Diagnosis not present

## 2022-12-08 DIAGNOSIS — M21969 Unspecified acquired deformity of unspecified lower leg: Secondary | ICD-10-CM

## 2022-12-08 DIAGNOSIS — M2021 Hallux rigidus, right foot: Secondary | ICD-10-CM | POA: Diagnosis not present

## 2022-12-08 NOTE — Progress Notes (Signed)

## 2022-12-16 ENCOUNTER — Telehealth: Payer: Self-pay

## 2022-12-16 LAB — HM DEXA SCAN

## 2022-12-16 NOTE — Telephone Encounter (Signed)
Patient states she needs an 8 extra wide in diabetic shoes. The shoes she ordered are causing blisters.

## 2023-01-04 NOTE — Telephone Encounter (Signed)
Order placed for 8XWD

## 2023-02-25 ENCOUNTER — Other Ambulatory Visit: Payer: Medicare HMO

## 2023-03-10 LAB — LAB REPORT - SCANNED
A1c: 5.4
Albumin, Urine POC: <3
Creatinine, POC: 78.7 mg/dL
EGFR: 63.3
TSH: 1.52

## 2023-03-11 ENCOUNTER — Telehealth: Payer: Self-pay

## 2023-03-12 NOTE — Telephone Encounter (Signed)
 N/A was going to send shoes back but I see now patient has upcoming appt to pick up  However if shoes are not in new condition we cannot send back as it has been a few months since we have been trying to get the exchange done

## 2023-04-12 ENCOUNTER — Ambulatory Visit: Payer: Medicare HMO

## 2023-04-12 NOTE — Progress Notes (Signed)
 Patient was here to get shoes for exchange and then expressed she wanted black that was originally ordered  They were not avail back in December when I ordered the exchange and so ordered the white which was her 2nd choice, now black is back in stock and she'd rather have them. Will ship when in  [place]

## 2023-10-20 NOTE — Progress Notes (Signed)
 Office Visit Note  Patient: Melanie Meyers             Date of Birth: Nov 22, 1955           MRN: [id number]             PCP: Venancio Pock, PA-C Referring: Burt Fus, DPM Visit Date: 11/03/2023 Occupation: Data Unavailable  Subjective:  Pain in right foot  History of Present Illness: Melanie Meyers is a 68 y.o. female returns today after her last visit in 2021.  She has longstanding history of osteoarthritis and degenerative disc disease.  She was initially seen by me in November 2021 due to increased joint pain.  At that time she was also evaluated by pulmonology for enlarged lymph nodes on the CT chest which was later diagnosed as sarcoidosis.  She also has history of neuropathy for which she has been followed by neurology.  At her initial visit the x-rays of hands shows osteoarthritis and knee joints severe osteoarthritis and severe chondromalacia patella.  She was under care of Dr. Lindle in [place] for pulmonary sarcoidosis. She was getting cortisone injections and viscosupplement injections at [facility] for osteoarthritis of her knees.  Patient states she moved to [place] in 2023 and stayed with her daughter.  She states she had right Achilles tendon repair followed by bilateral total knee replacement in 2023.  She moved back to [facility] now.  She is establishing back to Dr. Lindle regarding sarcoidosis.  She states she had right midfoot stress fracture.  Patient does not recall when she had it.  She also have hammertoes.  She went to see Dr. Burt in [place] who did some labs and advised her to see me.  She states she has been getting DEXA scan at [facility] through her PCP.  She does not know the results of her bone density.  There is family history of Raynauds disease.  She is a widow.  She is gravida 3 para 3.  No history of preeclampsia or DVTs.  She lives by herself now.  She is on disability and retired.  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   She enjoys sewing and reading and walks for exercise.  She has never been a smoker.  She does not drink alcohol.  She had menopause in mid 14s.  She does not take calcium or vitamin D.  She has been on Synthroid  and Lyrica  for a long time.  She does not take any antacids.  There is no history of radiation therapy or bone cancer   Activities of Daily Living:  Patient reports morning stiffness for a few minutes.   Patient Reports nocturnal pain.  Difficulty dressing/grooming: Denies Difficulty climbing stairs: Reports Difficulty getting out of chair: Reports Difficulty using hands for taps, buttons, cutlery, and/or writing: Reports  Review of Systems  Constitutional:  Positive for fatigue.  HENT:  Negative for mouth sores and mouth dryness.   Eyes:  Positive for dryness.  Respiratory:  Negative for shortness of breath.   Cardiovascular:  Negative for chest pain and palpitations.  Gastrointestinal:  Positive for constipation. Negative for blood in stool and diarrhea.  Endocrine: Negative for increased urination.  Genitourinary:  Negative for involuntary urination.  Musculoskeletal:  Positive for joint pain, gait problem, joint pain, joint swelling, myalgias, muscle weakness, morning stiffness, muscle tenderness and myalgias.  Skin:  Positive for color change. Negative for rash, hair loss and sensitivity to sunlight.  Allergic/Immunologic: Negative for susceptible to infections.  Neurological:  Positive for dizziness, numbness and headaches.  Hematological:  Negative for swollen glands.  Psychiatric/Behavioral:  Positive for sleep disturbance. Negative for depressed mood. The patient is nervous/anxious.     PMFS History:  Patient Active Problem List   Diagnosis Date Noted   Chronic pain of right knee 03/26/2020   Vitamin D deficiency    Type II Diabetes Mellitus without complication (HCC)    Sarcoidosis    Raynaud's disease    Obstructive sleep apnea    Insomnia    Hypertension     GERD (gastroesophageal reflux disease)    Generalized anxiety disorder    Fibromyalgia    Depression    Degenerative arthritis    Back pain    B12 deficiency    Acute tear of lateral meniscus of right knee with peripheral detachment 10/13/2018   Pain in right knee 09/21/2018   Major depressive disorder, recurrent episode with anxious distress    Acquired hallux rigidus of right foot 04/13/2018   Osteoarthritis of knee 03/28/2018   Pes anserinus bursitis of right knee 03/28/2018   CAD (coronary artery disease) 11/10/2017   Osteoarthritis of finger of right hand 02/10/2017   Chronic tension-type headache, not intractable 07/10/2016   Diabetic peripheral neuropathy (HCC) 05/17/2016   Transient alteration of awareness 10/10/2015   HTN (hypertension) 06/20/2015   Hyperlipidemia 06/20/2015   Morbid obesity (HCC) 06/20/2015   Metatarsal deformity 05/30/2014   Metatarsalgia 05/30/2014   Hand paresthesia 06/27/2013   Hypothyroidism 06/02/2013   Type 2 diabetes mellitus (HCC) 06/02/2013   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 02/10/2013   Nerve root pain 01/27/2013   Narrowing of intervertebral disc space 01/27/2013   Myofascial pain 01/27/2013   Cervical spine pain 11/24/2012   Cervical osteoarthritis 11/24/2012   Carpal tunnel syndrome 01/15/2012   Carpal tunnel syndrome of right wrist 01/15/2012   Asthma 01/20/2008    Past Medical History:  Diagnosis Date   Asthma    B12 deficiency    Back pain    CAD (coronary artery disease) 11/10/2017   Carpal tunnel syndrome of right wrist 01/15/2012   Chronic tension-type headache, not intractable 07/10/2016   Degenerative arthritis    degenerative arthritis of neck   Depression    Diabetic peripheral neuropathy (HCC) 05/17/2016   Fibromyalgia    Generalized anxiety disorder    GERD (gastroesophageal reflux disease)    HTN (hypertension) 06/20/2015   Hyperlipidemia 06/20/2015   Hypertension    Hypothyroidism 06/02/2013    Insomnia    Major depressive disorder, recurrent episode with anxious distress    Obstructive sleep apnea    On CPAP   Raynaud's disease    Per patient   Sarcoidosis    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 02/10/2013   Transient alteration of awareness 10/10/2015   Type II Diabetes Mellitus without complication (HCC)    Vitamin D deficiency     Family History  Problem Relation Age of Onset   Parkinson's disease Mother    Diabetes Mother    Alzheimer's disease Mother    Gout Mother    High blood pressure Father    High Cholesterol Father    Heart attack Father    Thyroid  disease Sister    Heart attack Sister    Thyroid  disease Sister    Gout Sister    Thyroid  disease Sister    Allergies Daughter    Thyroid  disease Daughter    Asthma Daughter  Autoimmune disease Son    Rheum arthritis Cousin    Past Surgical History:  Procedure Laterality Date   ANTERIOR CERVICAL DECOMP/DISCECTOMY FUSION N/A 10/06/2017   Procedure: ANTERIOR CERVICAL DECOMPRESSION/DISCECTOMY FUSION C4-7;  Surgeon: Burnetta Aures, MD;  Location: [facility] OR;  Service: Orthopedics;  Laterality: N/A;  4.5 hrs   BREAST REDUCTION SURGERY  02/18/2015   cardiac stent  2023   CARPAL TUNNEL RELEASE  2014   CARPAL TUNNEL RELEASE  04/2016   CESAREAN SECTION  1981, 1982, 1988   LEFT HEART CATH AND CORONARY ANGIOGRAPHY N/A 09/29/2017   Procedure: LEFT HEART CATH AND CORONARY ANGIOGRAPHY;  Surgeon: Jordan, Peter M, MD;  Location[facility] INVASIVE CV LAB;  Service: Cardiovascular;  Laterality: N/A;   NASAL POLYP SURGERY  1983   REPLACEMENT TOTAL KNEE Bilateral 2023   SINOSCOPY  10/09/2014   TONSILLECTOMY  1978   TUBAL LIGATION     Social History   Tobacco Use   Smoking status: Never    Passive exposure: Past   Smokeless tobacco: Never  Vaping Use   Vaping status: Never Used  Substance Use Topics   Alcohol use: No    Alcohol/week: 0.0 standard drinks of alcohol   Drug use: No   Social History   Social  History Narrative   Not on file      There is no immunization history on file for this patient.   Objective: Vital Signs: BP 136/80 (BP Location: Right Arm, Patient Position: Sitting, Cuff Size: Normal)   Pulse 69   Temp 98.2 F (36.8 C)   Resp 15   Ht 5' (1.524 m)   Wt 134 lb 9.6 oz (61.1 kg)   LMP  (LMP Unknown) Comment: tubal ligation  BMI 26.29 kg/m    Physical Exam Vitals and nursing note reviewed.  Constitutional:      Appearance: She is well-developed.  HENT:     Head: Normocephalic and atraumatic.  Eyes:     Conjunctiva/sclera: Conjunctivae normal.  Cardiovascular:     Rate and Rhythm: Normal rate and regular rhythm.     Heart sounds: Normal heart sounds.  Pulmonary:     Effort: Pulmonary effort is normal.     Breath sounds: Normal breath sounds.  Abdominal:     General: Bowel sounds are normal.     Palpations: Abdomen is soft.  Musculoskeletal:     Cervical back: Normal range of motion.  Lymphadenopathy:     Cervical: No cervical adenopathy.  Skin:    General: Skin is warm and dry.     Capillary Refill: Capillary refill takes less than 2 seconds.  Neurological:     Mental Status: She is alert and oriented to person, place, and time.  Psychiatric:        Behavior: Behavior normal.      Musculoskeletal Exam: She had limited range of motion of the cervical spine.  She had discomfort range of motion of the lumbar spine.  Shoulders, elbows, wrist joints, MCPs PIPs and DIPs were in good range of motion with no synovitis.  PIP and DIP thickening was noted.  Hip joints and knee joints were in good range of motion.  Bilateral knee joints were replaced.  She has some tenderness at the base of the 2nd and 3rd MTPs.  Mild dorsal spurring was noted.  CDAI Exam: CDAI Score: -- Patient Global: --; Provider Global: -- Swollen: --; Tender: -- Joint Exam 11/03/2023   No joint exam has been documented for this visit  There is currently no information documented on  the homunculus. Go to the Rheumatology activity and complete the homunculus joint exam.  Investigation: No additional findings.  Imaging: No results found.  Recent Labs: Lab Results  Component Value Date   WBC 8.0 12/22/2019   HGB 12.6 12/22/2019   PLT 245 12/22/2019   NA 137 12/22/2019   K 4.1 12/22/2019   CL 104 12/22/2019   CO2 24 12/22/2019   GLUCOSE 124 (H) 12/22/2019   BUN 16 12/22/2019   CREATININE 1.02 (H) 12/22/2019   BILITOT 0.2 12/04/2019   ALKPHOS 77 07/25/2019   AST 16 12/04/2019   ALT 15 12/04/2019   PROT 6.9 12/04/2019   ALBUMIN 4.1 07/25/2019   CALCIUM 9.3 12/22/2019   GFRAA 72 12/04/2019   December 04 2019 ACE 30  April 01, 2023 CBC WBC 4.7, hemoglobin 13.0, platelets 195, CMP creatinine 1.25, GFR 48, AST 25, ALT 16, ANA 1: 40, ENA (centromere, dsDNA, Smith, U1 RNP, SSA, SSB, SCL 70) negative, anticardiolipin negative, C3 normal,, RF negative, anti-CCP negative, anti-TPO antibody negative, HLA-B27 negative, uric acid 4.7, sed rate 2, C-reactive protein less than 1  Speciality Comments: No specialty comments available.  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 Lindle in [place].  She was not treated with any DMARDs.  Patient moved to [place] and now has moved back to [place].  She denies any shortness of breath or lymphadenopathy.  She plans to reestablish with Dr. Lindle.  Raynaud's disease without gangrene-she gives history of Raynaud's phenomenon.  No nailbed capillary changes or sclerodactyly was noted.  Keeping core temperature warm and warm clothing was discussed.  Positive ANA-she was found to have positive ANA 1: 40 on the recent labs.  ENA panel was negative and the complements were normal.  She has no clinical features of lupus.  No further  workup is needed.  Primary osteoarthritis of both hands-she has bilateral CMC PIP and DIP thickening with no synovitis.  Joint protection was discussed.  Carpal tunnel syndrome of right wrist-currently not symptomatic.  Status post total knee replacement, bilateral - BTKR 2023 in [place].  Patient's had good results with bilateral total knee replacements.  Pain in both feet-patient states she developed right midfoot stress fracture.  I do not have records to confirm.  She has been having discomfort in her right foot and had seen Dr. Burt in [place].  All autoimmune labs including uric acid were  within normal limits.  Acquired hallux rigidus of right foot  DDD (degenerative disc disease), cervical -she had limited range of motion of the cervical spine.  She was followed by Dr. Bonner  Chronic midline low back pain without sciatica-she continues to have some lower back pain.  Osteoporosis screening-patient states she has been getting DEXA spine in .  If she has osteopenia and history of a stress fracture she should be on treatment for osteoporosis.  I do not have results available.  Patient plans to follow-up with her PCP.  Patient states she had menopause in her 68s.  She walks for exercise.  Resistive exercises were discussed.  She has been on pregabalin  and Synthroid  for many years.  She does not recall having frequent prednisone.  There is no history of radiation therapy or bone cancer.  Anabolic agents may be indicated if she  has a stress fracture.  I do not have records to confirm that.  Fibromyalgia-she continues to have some generalized pain and discomfort from fibromyalgia.  Vitamin D deficiency-patient is not taking vitamin D.  Use of calcium and vitamin D was discussed.  Other medical problems are listed as follows:  Primary insomnia  Primary hypertension  Mixed hyperlipidemia  Type II Diabetes Mellitus without complication (HCC)  Diabetic peripheral neuropathy  (HCC)  Gastroesophageal reflux disease without esophagitis  Chronic tension-type headache, not intractable  Anxiety and depression  Obstructive sleep apnea  Orders: No orders of the defined types were placed in this encounter.  No orders of the defined types were placed in this encounter.   Follow-Up Instructions: Return if symptoms worsen or fail to improve, for Sarcoidosis.   Maya Nash, MD  Note - This record has been created using Animal nutritionist.  Chart creation errors have been sought, but may not always  have been located. Such creation errors do not reflect on  the standard of medical care.

## 2023-10-26 LAB — LAB REPORT - SCANNED
A1c: 5.4
EGFR: 57.4
TSH: 1.57

## 2023-11-03 ENCOUNTER — Encounter: Payer: Self-pay | Admitting: Rheumatology

## 2023-11-03 ENCOUNTER — Ambulatory Visit: Attending: Rheumatology | Admitting: Rheumatology

## 2023-11-03 VITALS — BP 136/80 | HR 69 | Temp 98.2°F | Resp 15 | Ht 60.0 in | Wt 134.6 lb

## 2023-11-03 DIAGNOSIS — F419 Anxiety disorder, unspecified: Secondary | ICD-10-CM

## 2023-11-03 DIAGNOSIS — E782 Mixed hyperlipidemia: Secondary | ICD-10-CM

## 2023-11-03 DIAGNOSIS — M19041 Primary osteoarthritis, right hand: Secondary | ICD-10-CM

## 2023-11-03 DIAGNOSIS — I10 Essential (primary) hypertension: Secondary | ICD-10-CM

## 2023-11-03 DIAGNOSIS — Z96653 Presence of artificial knee joint, bilateral: Secondary | ICD-10-CM

## 2023-11-03 DIAGNOSIS — M79671 Pain in right foot: Secondary | ICD-10-CM

## 2023-11-03 DIAGNOSIS — I7300 Raynaud's syndrome without gangrene: Secondary | ICD-10-CM

## 2023-11-03 DIAGNOSIS — M170 Bilateral primary osteoarthritis of knee: Secondary | ICD-10-CM

## 2023-11-03 DIAGNOSIS — E1142 Type 2 diabetes mellitus with diabetic polyneuropathy: Secondary | ICD-10-CM

## 2023-11-03 DIAGNOSIS — D862 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: Secondary | ICD-10-CM

## 2023-11-03 DIAGNOSIS — G5601 Carpal tunnel syndrome, right upper limb: Secondary | ICD-10-CM | POA: Diagnosis not present

## 2023-11-03 DIAGNOSIS — K219 Gastro-esophageal reflux disease without esophagitis: Secondary | ICD-10-CM

## 2023-11-03 DIAGNOSIS — M5030 Other cervical disc degeneration, unspecified cervical region: Secondary | ICD-10-CM

## 2023-11-03 DIAGNOSIS — E119 Type 2 diabetes mellitus without complications: Secondary | ICD-10-CM

## 2023-11-03 DIAGNOSIS — G44229 Chronic tension-type headache, not intractable: Secondary | ICD-10-CM

## 2023-11-03 DIAGNOSIS — M19042 Primary osteoarthritis, left hand: Secondary | ICD-10-CM

## 2023-11-03 DIAGNOSIS — E559 Vitamin D deficiency, unspecified: Secondary | ICD-10-CM

## 2023-11-03 DIAGNOSIS — G8929 Other chronic pain: Secondary | ICD-10-CM

## 2023-11-03 DIAGNOSIS — M79672 Pain in left foot: Secondary | ICD-10-CM

## 2023-11-03 DIAGNOSIS — F5101 Primary insomnia: Secondary | ICD-10-CM

## 2023-11-03 DIAGNOSIS — F32A Depression, unspecified: Secondary | ICD-10-CM

## 2023-11-03 DIAGNOSIS — M797 Fibromyalgia: Secondary | ICD-10-CM

## 2023-11-03 DIAGNOSIS — M2021 Hallux rigidus, right foot: Secondary | ICD-10-CM

## 2023-11-03 DIAGNOSIS — R7689 Other specified abnormal immunological findings in serum: Secondary | ICD-10-CM

## 2023-11-03 DIAGNOSIS — G4733 Obstructive sleep apnea (adult) (pediatric): Secondary | ICD-10-CM

## 2023-11-03 DIAGNOSIS — M545 Low back pain, unspecified: Secondary | ICD-10-CM

## 2023-11-03 DIAGNOSIS — Z13820 Encounter for screening for osteoporosis: Secondary | ICD-10-CM

## 2023-11-04 ENCOUNTER — Ambulatory Visit (HOSPITAL_BASED_OUTPATIENT_CLINIC_OR_DEPARTMENT_OTHER): Admitting: Family Medicine

## 2023-11-09 ENCOUNTER — Encounter (HOSPITAL_BASED_OUTPATIENT_CLINIC_OR_DEPARTMENT_OTHER): Payer: Self-pay | Admitting: Family Medicine

## 2023-11-09 ENCOUNTER — Ambulatory Visit (INDEPENDENT_AMBULATORY_CARE_PROVIDER_SITE_OTHER): Admitting: Family Medicine

## 2023-11-09 VITALS — BP 125/81 | HR 86 | Temp 98.6°F | Resp 16 | Ht 60.63 in | Wt 136.1 lb

## 2023-11-09 DIAGNOSIS — F411 Generalized anxiety disorder: Secondary | ICD-10-CM

## 2023-11-09 DIAGNOSIS — G894 Chronic pain syndrome: Secondary | ICD-10-CM | POA: Diagnosis not present

## 2023-11-09 DIAGNOSIS — T753XXA Motion sickness, initial encounter: Secondary | ICD-10-CM | POA: Insufficient documentation

## 2023-11-09 DIAGNOSIS — I2585 Chronic coronary microvascular dysfunction: Secondary | ICD-10-CM

## 2023-11-09 DIAGNOSIS — R270 Ataxia, unspecified: Secondary | ICD-10-CM | POA: Diagnosis not present

## 2023-11-09 DIAGNOSIS — R12 Heartburn: Secondary | ICD-10-CM | POA: Insufficient documentation

## 2023-11-09 MED ORDER — DULOXETINE HCL 30 MG PO CPEP
30.0000 mg | ORAL_CAPSULE | Freq: Every day | ORAL | 1 refills | Status: DC
Start: 2023-11-09 — End: 2023-11-30

## 2023-11-09 NOTE — Assessment & Plan Note (Signed)
 Meds adjusted today to hopefully help more with the chronic pain.

## 2023-11-09 NOTE — Assessment & Plan Note (Signed)
 Mild.  I agreed to renew her order for PT and OT.

## 2023-11-09 NOTE — Assessment & Plan Note (Signed)
 Obtain local Cardiologist.  Continue to review her extensive PMH.

## 2023-11-09 NOTE — Assessment & Plan Note (Addendum)
 She will reduce her Sertraline  to 50mg  daily for one week, before replacing it with the Duloxetine.  Obviously alert us  if she struggles with this transition.

## 2023-11-09 NOTE — Progress Notes (Signed)
 New Patient Office Visit  Subjective    Patient ID: Melanie Meyers, female    DOB: May 27, 1955  Age: 68 y.o. MRN: [id number]  CC:  Chief Complaint  Patient presents with   Establish Care    Establish Care     HPI Melanie Meyers presents to establish care F/u as above.  New to our practice.  Generally doing well, though does struggle some with Neuropathic pain in her hands.  Her PMH is somewhat extensive and reviewed in detail today.  Rheumatology note appreciated.  Extended discussion.  Outpatient Encounter Medications as of 11/09/2023  Medication Sig   albuterol  (VENTOLIN  HFA) 108 (90 Base) MCG/ACT inhaler Inhale 2 puffs into the lungs every 6 (six) hours as needed for wheezing or shortness of breath.    aspirin  EC 81 MG tablet Take 81 mg by mouth at bedtime.    Capsicum, Cayenne, (CAYENNE PEPPER PO) Take by mouth daily.   diclofenac  Sodium (VOLTAREN ) 1 % GEL Apply 2-3 application topically 4 (four) times daily.   DULoxetine (CYMBALTA) 30 MG capsule Take 1 capsule (30 mg total) by mouth daily.   levothyroxine  (SYNTHROID , LEVOTHROID) 25 MCG tablet Take 25 mcg by mouth daily before breakfast.   lidocaine  (LIDODERM ) 5 % Place 1 patch onto the skin as needed.    lubiprostone (AMITIZA) 24 MCG capsule Take 24 mcg by mouth 2 (two) times daily.   MAGNESIUM  PO Take by mouth daily.   meclizine (ANTIVERT) 12.5 MG tablet Take 1-2 tablets by mouth every 4 (four) hours as needed.   Menthol , Topical Analgesic, (BIOFREEZE EX) Apply 1 application topically daily as needed (muscle pain).   methocarbamol  (ROBAXIN ) 500 MG tablet Take 1 tablet (500 mg total) by mouth 3 (three) times daily. (Patient taking differently: Take 500 mg by mouth 2 (two) times daily.)   montelukast  (SINGULAIR ) 10 MG tablet Take 10 mg by mouth at bedtime.    MOUNJARO 7.5 MG/0.5ML Pen Inject 7.5 mg into the skin once a week.   Multiple Vitamin (MULTIVITAMIN WITH MINERALS) TABS tablet Take 1 tablet by mouth at  bedtime.   polyethylene glycol (MIRALAX  / GLYCOLAX ) packet Take 17 g by mouth daily as needed for mild constipation.    pregabalin  (LYRICA ) 150 MG capsule Take 1 cap in the morning and 2 caps in the evening   rizatriptan (MAXALT) 10 MG tablet Take 10 mg by mouth as needed for migraine.    silver sulfADIAZINE (SILVADENE) 1 % cream as needed.   TART CHERRY PO Take by mouth daily.   traZODone (DESYREL) 100 MG tablet Take 100 mg by mouth at bedtime.   [DISCONTINUED] sertraline  (ZOLOFT ) 100 MG tablet Take 100 mg by mouth daily.   [DISCONTINUED] Azelastine HCl 137 MCG/SPRAY SOLN Place 1 spray into the nose as needed.    [DISCONTINUED] Biotin 89999 MCG TABS Take 10,000 mcg by mouth at bedtime.   [DISCONTINUED] budesonide -formoterol  (SYMBICORT ) 160-4.5 MCG/ACT inhaler Inhale 1-2 puffs into the lungs 2 (two) times daily as needed.   [DISCONTINUED] Insulin  Glargine-Lixisenatide (SOLIQUA) 100-33 UNT-MCG/ML SOPN Inject 26 Units into the skin in the morning. 30 min before breakfast   [DISCONTINUED] Insulin  Pen Needle (DROPLET PEN NEEDLES) 31G X 6 MM MISC    [DISCONTINUED] LINZESS 145 MCG CAPS capsule 145 mcg as needed.   [DISCONTINUED] metFORMIN  (GLUCOPHAGE ) 1000 MG tablet Take 1 tablet (1,000 mg total) by mouth 2 (two) times daily with a meal. (Patient taking differently: Take 500 mg by mouth 2 (two) times daily with  a meal.)   [DISCONTINUED] nitroGLYCERIN  (NITROSTAT ) 0.4 MG SL tablet Place 1 tablet (0.4 mg total) under the tongue every 5 (five) minutes as needed.   [DISCONTINUED] olmesartan  (BENICAR ) 20 MG tablet Take 0.5 tablets (10 mg total) by mouth daily. Needs appointment for further refills   [DISCONTINUED] Omega-3 Fatty Acids (FISH OIL) 1000 MG CAPS Take 2,000 mg by mouth at bedtime.   [DISCONTINUED] pravastatin  (PRAVACHOL ) 40 MG tablet Take 40 mg by mouth daily.    [DISCONTINUED] promethazine  (PHENERGAN ) 12.5 MG tablet Take 1-2 tablets by mouth every 4 (four) hours as needed.   [DISCONTINUED]  TRULANCE 3 MG TABS Take 3 mg by mouth as needed.   [DISCONTINUED] zolpidem (AMBIEN) 10 MG tablet    No facility-administered encounter medications on file as of 11/09/2023.    Past Medical History:  Diagnosis Date   Asthma    B12 deficiency    CAD (coronary artery disease) 11/10/2017   Known to Cardiology   Chronic tension-type headache, not intractable 07/10/2016   Degenerative arthritis    degenerative arthritis of neck   Diabetic peripheral neuropathy (HCC) 05/17/2016   Fibromyalgia    GERD (gastroesophageal reflux disease)    Hyperlipidemia 06/20/2015   Hypertension    Hypothyroidism 06/02/2013   Insomnia    Major depressive disorder, recurrent episode with anxious distress    Obstructive sleep apnea    On CPAP   Osteoarthritis    Osteopenia    Raynaud's disease    f/by [facility]   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 Mardee   Vitamin D deficiency     Past Surgical History:  Procedure Laterality Date   ANTERIOR CERVICAL DECOMP/DISCECTOMY FUSION N/A 10/06/2017   Procedure: ANTERIOR CERVICAL DECOMPRESSION/DISCECTOMY FUSION C4-7;  Surgeon: Burnetta Aures, MD;  Location: [facility] OR;  Service: Orthopedics;  Laterality: N/A;  4.5 hrs   BREAST REDUCTION SURGERY  02/18/2015   cardiac stent  2023   CARPAL TUNNEL RELEASE  2014   CARPAL TUNNEL RELEASE  04/2016   CESAREAN SECTION  1981, 1982, 1988   LEFT HEART CATH AND CORONARY ANGIOGRAPHY N/A 09/29/2017   Procedure: LEFT HEART CATH AND CORONARY ANGIOGRAPHY;  Surgeon: Jordan, Peter M, MD;  Location: [facility] INVASIVE CV LAB;  Service: Cardiovascular;  Laterality: N/A;   NASAL POLYP SURGERY  1983   REPLACEMENT TOTAL KNEE Bilateral 2023   SINOSCOPY  10/09/2014   TONSILLECTOMY  1978   TUBAL LIGATION      Family History  Problem Relation Age of Onset   Parkinson's disease Mother    Diabetes Mother    Alzheimer's disease Mother    Gout Mother    High blood pressure Father    High  Cholesterol Father    Heart attack Father    Thyroid  disease Sister    Heart attack Sister    Thyroid  disease Sister    Gout Sister    Thyroid  disease Sister    Allergies Daughter    Thyroid  disease Daughter    Asthma Daughter    Autoimmune disease Son    Rheum arthritis Cousin     Social History   Tobacco Use   Smoking status: Never    Passive exposure: Past   Smokeless tobacco: Never  Vaping Use   Vaping status: Never Used  Substance Use Topics   Alcohol use: No    Alcohol/week: 0.0 standard drinks of alcohol   Drug use: No    Review of Systems  Constitutional:  Positive for malaise/fatigue. Negative for diaphoresis, fever and weight loss.  Respiratory:  Negative for cough, shortness of breath and wheezing.   Cardiovascular:  Negative for chest pain, palpitations, orthopnea, claudication, leg swelling and PND.  Musculoskeletal:  Positive for back pain, joint pain, myalgias and neck pain. Negative for falls.  Psychiatric/Behavioral:  Positive for depression. Negative for hallucinations, substance abuse and suicidal ideas. The patient is nervous/anxious.         Objective    BP 125/81 (Cuff Size: Normal)   Pulse 86   Temp 98.6 F (37 C) (Oral)   Resp 16   Ht 5' 0.63 (1.54 m)   Wt 136 lb 1.6 oz (61.7 kg)   LMP  (LMP Unknown) Comment: tubal ligation  SpO2 97%   BMI 26.03 kg/m   Physical Exam Constitutional:      General: She is not in acute distress.    Appearance: Normal appearance.  HENT:     Head: Normocephalic.  Neck:     Vascular: No carotid bruit.  Cardiovascular:     Rate and Rhythm: Normal rate and regular rhythm.     Pulses: Normal pulses.     Heart sounds: Normal heart sounds.  Pulmonary:     Effort: Pulmonary effort is normal.     Breath sounds: Normal breath sounds.  Abdominal:     General: Bowel sounds are normal.     Palpations: Abdomen is soft.  Musculoskeletal:     Cervical back: Neck supple. No tenderness.     Right lower leg:  No edema.     Left lower leg: No edema.  Neurological:     Mental Status: She is alert.         Assessment & Plan:  Chronic coronary microvascular dysfunction Assessment & Plan: Obtain local Cardiologist.  Continue to review her extensive PMH.  Orders: -     Ambulatory referral to Cardiology  Ataxia Assessment & Plan: Mild.  I agreed to renew her order for PT and OT.  Orders: -     Ambulatory referral to Physical Therapy  Chronic pain syndrome Assessment & Plan: She will reduce her Sertraline  to 50mg  daily for one week, before replacing it with the Duloxetine.  Obviously alert us  if she struggles with this transition.  Orders: -     DULoxetine HCl; Take 1 capsule (30 mg total) by mouth daily.  Dispense: 30 capsule; Refill: 1  Generalized anxiety disorder Assessment & Plan: Meds adjusted today to hopefully help more with the chronic pain.     Return in about 4 weeks (around 12/07/2023) for chronic follow-up.   REDDING PONCE NORLEEN FALCON., MD

## 2023-11-15 ENCOUNTER — Encounter (HOSPITAL_BASED_OUTPATIENT_CLINIC_OR_DEPARTMENT_OTHER): Payer: Self-pay

## 2023-11-15 ENCOUNTER — Ambulatory Visit (HOSPITAL_BASED_OUTPATIENT_CLINIC_OR_DEPARTMENT_OTHER)
Admission: EM | Admit: 2023-11-15 | Discharge: 2023-11-15 | Disposition: A | Discharge status: Home / Self Care | Attending: Family Medicine | Admitting: Family Medicine

## 2023-11-15 ENCOUNTER — Other Ambulatory Visit (HOSPITAL_BASED_OUTPATIENT_CLINIC_OR_DEPARTMENT_OTHER): Payer: Self-pay

## 2023-11-15 ENCOUNTER — Ambulatory Visit (HOSPITAL_BASED_OUTPATIENT_CLINIC_OR_DEPARTMENT_OTHER): Payer: Self-pay | Admitting: Family Medicine

## 2023-11-15 ENCOUNTER — Ambulatory Visit (INDEPENDENT_AMBULATORY_CARE_PROVIDER_SITE_OTHER): Admit: 2023-11-15 | Discharge: 2023-11-15 | Disposition: A | Discharge status: Home / Self Care | Admitting: Radiology

## 2023-11-15 ENCOUNTER — Other Ambulatory Visit: Payer: Self-pay

## 2023-11-15 DIAGNOSIS — K5900 Constipation, unspecified: Secondary | ICD-10-CM

## 2023-11-15 DIAGNOSIS — R1012 Left upper quadrant pain: Secondary | ICD-10-CM

## 2023-11-15 DIAGNOSIS — R1032 Left lower quadrant pain: Secondary | ICD-10-CM

## 2023-11-15 DIAGNOSIS — R110 Nausea: Secondary | ICD-10-CM | POA: Diagnosis not present

## 2023-11-15 DIAGNOSIS — R1011 Right upper quadrant pain: Secondary | ICD-10-CM

## 2023-11-15 DIAGNOSIS — R1031 Right lower quadrant pain: Secondary | ICD-10-CM

## 2023-11-15 MED ORDER — ONDANSETRON 4 MG PO TBDP
4.0000 mg | ORAL_TABLET | Freq: Three times a day (TID) | ORAL | 0 refills | Status: DC | PRN
  Filled 2023-11-15: qty 20, 7d supply, fill #0

## 2023-11-15 MED ORDER — ONDANSETRON HCL 4 MG/2ML IJ SOLN
4.0000 mg | Freq: Once | INTRAMUSCULAR | Status: AC
Start: 2023-11-15 — End: 2023-11-15
  Administered 2023-11-15: 4 mg via INTRAMUSCULAR

## 2023-11-15 NOTE — ED Provider Notes (Signed)
 [facility]    CSN: [id number] Arrival date & time: 11/15/23  1220      History   Chief Complaint Chief Complaint  Patient presents with   Nausea    HPI Melanie Meyers is a 68 y.o. female.   68 year old female with report of chronic constipation and IBS.  She was having a feeling that Linzess was no longer helping her IBS-see.  She saw gastroenterology and they stopped the Linzess and switched her to Amitiza.  Since she has been on the Amitiza she has had chronic nausea and abdominal pain.  She has also developed worsening constipation.  She stopped the Amitiza on 11/10/2023 due to worsening symptoms.  She has seen gastroenterology in the last month for that medication change but she has not been able to speak to them on the phone since the visit to let them know what is happening with her abdominal pain, constipation and nausea.  She feels constantly like she is going to vomit but she has not vomited at all.  Ondansetron  pills have given her some minimal relief but not a lot.     Past Medical History:  Diagnosis Date   Asthma    B12 deficiency    CAD (coronary artery disease) 11/10/2017   Known to Cardiology   Chronic tension-type headache, not intractable 07/10/2016   Degenerative arthritis    degenerative arthritis of neck   Diabetic peripheral neuropathy (HCC) 05/17/2016   Fibromyalgia    GERD (gastroesophageal reflux disease)    Hyperlipidemia 06/20/2015   Hypertension    Hypothyroidism 06/02/2013   Insomnia    Major depressive disorder, recurrent episode with anxious distress    Obstructive sleep apnea    On CPAP   Osteoarthritis    Osteopenia    Raynaud's disease    f/by [facility]   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 Mardee   Vitamin D deficiency     Patient Active Problem List   Diagnosis Date Noted   Heartburn 11/09/2023   Motion sickness 11/09/2023   Chronic pain syndrome 11/09/2023    Ataxia 11/09/2023   Rupture of right Achilles tendon 06/12/2020   Chronic pain of right knee 03/26/2020   Vitamin D deficiency    Type II Diabetes Mellitus without complication (HCC)    Sarcoidosis    Raynaud's disease    Obstructive sleep apnea    Insomnia    GERD (gastroesophageal reflux disease)    Generalized anxiety disorder    Fibromyalgia    Depression    Degenerative arthritis    Back pain    Pain in right knee 09/21/2018   Major depressive disorder, recurrent episode with anxious distress    Acquired hallux rigidus of right foot 04/13/2018   Osteoarthritis of knee 03/28/2018   Pes anserinus bursitis of right knee 03/28/2018   CAD (coronary artery disease) 11/10/2017   Osteoarthritis of finger of right hand 02/10/2017   Chronic tension-type headache, not intractable 07/10/2016   Diabetic peripheral neuropathy (HCC) 05/17/2016   Transient alteration of awareness 10/10/2015   Morbid obesity (HCC) 06/20/2015   Metatarsal deformity 05/30/2014   Metatarsalgia 05/30/2014   Hand paresthesia 06/27/2013   Hypothyroidism 06/02/2013   Type 2 diabetes mellitus (HCC) 06/02/2013   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 02/10/2013   Nerve root pain 01/27/2013   Narrowing of intervertebral disc space 01/27/2013   Myofascial pain 01/27/2013   Cervical spine pain 11/24/2012  Cervical osteoarthritis 11/24/2012   Asthma 01/20/2008    Past Surgical History:  Procedure Laterality Date   ANTERIOR CERVICAL DECOMP/DISCECTOMY FUSION N/A 10/06/2017   Procedure: ANTERIOR CERVICAL DECOMPRESSION/DISCECTOMY FUSION C4-7;  Surgeon: Burnetta Aures, MD;  Location: [facility] OR;  Service: Orthopedics;  Laterality: N/A;  4.5 hrs   BREAST REDUCTION SURGERY  02/18/2015   cardiac stent  2023   CARPAL TUNNEL RELEASE  2014   CARPAL TUNNEL RELEASE  04/2016   CESAREAN SECTION  1981, 1982, 1988   LEFT HEART CATH AND CORONARY ANGIOGRAPHY N/A 09/29/2017   Procedure: LEFT HEART CATH AND CORONARY  ANGIOGRAPHY;  Surgeon: Jordan, Peter M, MD;  Location: [facility] INVASIVE CV LAB;  Service: Cardiovascular;  Laterality: N/A;   NASAL POLYP SURGERY  1983   REPLACEMENT TOTAL KNEE Bilateral 2023   SINOSCOPY  10/09/2014   TONSILLECTOMY  1978   TUBAL LIGATION      OB History   No obstetric history on file.      Home Medications    Prior to Admission medications   Medication Sig Start Date End Date Taking? Authorizing Provider  ondansetron  (ZOFRAN -ODT) 4 MG disintegrating tablet Take 1 tablet (4 mg total) by mouth every 8 (eight) hours as needed for nausea or vomiting. 11/15/23  Yes Ival Domino, FNP  albuterol  (VENTOLIN  HFA) 108 (90 Base) MCG/ACT inhaler Inhale 2 puffs into the lungs every 6 (six) hours as needed for wheezing or shortness of breath.     [provider]  aspirin  EC 81 MG tablet Take 81 mg by mouth at bedtime.     [provider]  Capsicum, Cayenne, (CAYENNE PEPPER PO) Take by mouth daily.    [provider]  diclofenac  Sodium (VOLTAREN ) 1 % GEL Apply 2-3 application topically 4 (four) times daily.    [provider]  DULoxetine (CYMBALTA) 30 MG capsule Take 1 capsule (30 mg total) by mouth daily. 11/09/23   Dottie Norleen PHEBE PONCE, MD  levothyroxine  (SYNTHROID , LEVOTHROID) 25 MCG tablet Take 25 mcg by mouth daily before breakfast.    [provider]  lidocaine  (LIDODERM ) 5 % Place 1 patch onto the skin as needed.  02/18/18   [provider]  lubiprostone (AMITIZA) 24 MCG capsule Take 24 mcg by mouth 2 (two) times daily. 10/28/23   [provider]  MAGNESIUM  PO Take by mouth daily.    [provider]  meclizine (ANTIVERT) 12.5 MG tablet Take 1-2 tablets by mouth every 4 (four) hours as needed. 06/29/19   [provider]  Menthol , Topical Analgesic, (BIOFREEZE EX) Apply 1 application topically daily as needed (muscle pain).    [provider]  methocarbamol  (ROBAXIN ) 500 MG tablet Take 1 tablet (500  mg total) by mouth 3 (three) times daily. Patient taking differently: Take 500 mg by mouth 2 (two) times daily. 10/06/17   Mayo, Dedra Maus, PA-C  montelukast  (SINGULAIR ) 10 MG tablet Take 10 mg by mouth at bedtime.  10/24/14   [provider]  MOUNJARO 7.5 MG/0.5ML Pen Inject 7.5 mg into the skin once a week. 06/21/21   [provider]  Multiple Vitamin (MULTIVITAMIN WITH MINERALS) TABS tablet Take 1 tablet by mouth at bedtime.    [provider]  polyethylene glycol (MIRALAX  / GLYCOLAX ) packet Take 17 g by mouth daily as needed for mild constipation.     [provider]  pregabalin  (LYRICA ) 150 MG capsule Take 1 cap in the morning and 2 caps in the evening 10/17/18  Georjean Darice HERO, MD  rizatriptan (MAXALT) 10 MG tablet Take 10 mg by mouth as needed for migraine.  02/07/16   [provider]  silver sulfADIAZINE (SILVADENE) 1 % cream as needed. 10/24/19   [provider]  TART CHERRY PO Take by mouth daily.    [provider]  traZODone (DESYREL) 100 MG tablet Take 100 mg by mouth at bedtime.    [provider]    Family History Family History  Problem Relation Age of Onset   Parkinson's disease Mother    Diabetes Mother    Alzheimer's disease Mother    Gout Mother    High blood pressure Father    High Cholesterol Father    Heart attack Father    Thyroid  disease Sister    Heart attack Sister    Thyroid  disease Sister    Gout Sister    Thyroid  disease Sister    Allergies Daughter    Thyroid  disease Daughter    Asthma Daughter    Autoimmune disease Son    Rheum arthritis Cousin     Social History Social History   Tobacco Use   Smoking status: Never    Passive exposure: Past   Smokeless tobacco: Never  Vaping Use   Vaping status: Never Used  Substance Use Topics   Alcohol use: No    Alcohol/week: 0.0 standard drinks of alcohol   Drug use: No     Allergies   Accupril [quinapril hcl],  Prednisone, Levaquin [levofloxacin], and Penicillin g   Review of Systems Review of Systems  Constitutional:  Positive for appetite change. Negative for chills and fever.  HENT:  Negative for ear pain and sore throat.   Eyes:  Negative for pain and visual disturbance.  Respiratory:  Negative for cough and shortness of breath.   Cardiovascular:  Negative for chest pain and palpitations.  Gastrointestinal:  Positive for abdominal pain and nausea. Negative for constipation, diarrhea and vomiting.  Genitourinary:  Negative for dysuria and hematuria.  Musculoskeletal:  Negative for arthralgias and back pain.  Skin:  Negative for color change and rash.  Neurological:  Negative for seizures and syncope.  All other systems reviewed and are negative.    Physical Exam Triage Vital Signs ED Triage Vitals  Encounter Vitals Group     BP 11/15/23 1326 (!) 148/81     Girls Systolic BP Percentile --      Girls Diastolic BP Percentile --      Boys Systolic BP Percentile --      Boys Diastolic BP Percentile --      Pulse Rate 11/15/23 1326 85     Resp 11/15/23 1326 20     Temp 11/15/23 1326 98.5 F (36.9 C)     Temp Source 11/15/23 1326 Oral     SpO2 11/15/23 1326 97 %     Weight --      Height --      Head Circumference --      Peak Flow --      Pain Score 11/15/23 1324 0     Pain Loc --      Pain Education --      Exclude from Growth Chart --    No data found.  Updated Vital Signs BP (!) 148/81 (BP Location: Right Arm)   Pulse 85   Temp 98.5 F (36.9 C) (Oral)   Resp 20   LMP  (LMP Unknown) Comment: tubal ligation  SpO2 97%   Visual  Acuity Right Eye Distance:   Left Eye Distance:   Bilateral Distance:    Right Eye Near:   Left Eye Near:    Bilateral Near:     Physical Exam Vitals and nursing note reviewed.  Constitutional:      General: She is not in acute distress.    Appearance: She is well-developed. She is not ill-appearing, toxic-appearing or diaphoretic.   HENT:     Head: Normocephalic and atraumatic.     Right Ear: Hearing, tympanic membrane, ear canal and external ear normal.     Left Ear: Hearing, tympanic membrane, ear canal and external ear normal.     Nose: No congestion or rhinorrhea.     Right Sinus: No maxillary sinus tenderness or frontal sinus tenderness.     Left Sinus: No maxillary sinus tenderness or frontal sinus tenderness.     Mouth/Throat:     Lips: Pink.     Mouth: Mucous membranes are moist.     Pharynx: Uvula midline. No oropharyngeal exudate or posterior oropharyngeal erythema.     Tonsils: No tonsillar exudate.  Eyes:     Conjunctiva/sclera: Conjunctivae normal.     Pupils: Pupils are equal, round, and reactive to light.  Cardiovascular:     Rate and Rhythm: Normal rate and regular rhythm.     Heart sounds: S1 normal and S2 normal. No murmur heard. Pulmonary:     Effort: Pulmonary effort is normal. No respiratory distress.     Breath sounds: Normal breath sounds. No decreased breath sounds, wheezing, rhonchi or rales.  Abdominal:     General: Bowel sounds are normal.     Palpations: Abdomen is soft.     Tenderness: There is abdominal tenderness (Mild to moderate) in the right upper quadrant, right lower quadrant, left upper quadrant and left lower quadrant. There is no right CVA tenderness, left CVA tenderness, guarding or rebound. Negative signs include Murphy's sign, Rovsing's sign and McBurney's sign.  Musculoskeletal:        General: No swelling.     Cervical back: Neck supple.  Lymphadenopathy:     Head:     Right side of head: No submental, submandibular, tonsillar, preauricular or posterior auricular adenopathy.     Left side of head: No submental, submandibular, tonsillar, preauricular or posterior auricular adenopathy.     Cervical: No cervical adenopathy.     Right cervical: No superficial cervical adenopathy.    Left cervical: No superficial cervical adenopathy.  Skin:    General: Skin is warm and  dry.     Capillary Refill: Capillary refill takes less than 2 seconds.     Findings: No rash.  Neurological:     Mental Status: She is alert and oriented to person, place, and time.  Psychiatric:        Mood and Affect: Mood normal.      UC Treatments / Results  Labs (all labs ordered are listed, but only abnormal results are displayed) Labs Reviewed - No data to display  EKG   Radiology DG Abd 1 View Result Date: 11/15/2023 EXAM: 1 VIEW XRAY OF THE ABDOMEN 11/15/2023 02:15:19 PM COMPARISON: None available. CLINICAL HISTORY: abdominal pain and nausea and constipation FINDINGS: BOWEL: Nonobstructive bowel gas pattern. Minimal fecal loading in the rectosigmoid colon. SOFT TISSUES: No opaque urinary calculi. BONES: Mild degenerative changes of both hips and the lumbar spine. IMPRESSION: 1. Nonobstructive bowel gas pattern. Electronically signed by: Rogelia Myers MD 11/15/2023 03:07 PM EST RP Workstation: [id number]  Procedures Procedures (including critical care time)  Medications Ordered in UC Medications  ondansetron  (ZOFRAN ) injection 4 mg (4 mg Intramuscular Given 11/15/23 1404)    Initial Impression / Assessment and Plan / UC Course  I have reviewed the triage vital signs and the nursing notes.  Pertinent labs & imaging results that were available during my care of the patient were reviewed by me and considered in my medical decision making (see chart for details).  Plan of Care: Abdominal pain with constipation nausea: Moderate gas on the abdominal x-ray.  No sign of any significant constipation at this time.  Ondansetron , 4 mg, melts on tongue, every 8 hours if needed for nausea or vomiting.  Get plenty of fluids.  Patient may need to stop Mounjaro.  It may be worsening all of her symptoms.  She should speak to primary care and/or GI about the Mounjaro.  She also needs to talk to GI about the chronic constipation and chronic nausea.  Follow-up here if needed.  I  reviewed the plan of care with the patient and/or the patient's guardian.  The patient and/or guardian had time to ask questions and acknowledged that the questions were answered.  I provided instruction on symptoms or reasons to return here or to go to an ER, if symptoms/condition did not improve, worsened or if new symptoms occurred.  Final Clinical Impressions(s) / UC Diagnoses   Final diagnoses:  Nausea without vomiting  Constipation, unspecified constipation type  Left upper quadrant abdominal pain  Left lower quadrant abdominal pain  Right upper quadrant abdominal pain  Right lower quadrant abdominal pain     Discharge Instructions      Abdominal pain with constipation nausea: Moderate gas on the abdominal x-ray.  No sign of any significant constipation at this time.  Ondansetron , 4 mg, melts on tongue, every 8 hours if needed for nausea or vomiting.  Get plenty of fluids.  Patient may need to stop Mounjaro.  It may be worsening all of her symptoms.  She should speak to primary care and/or GI about the Mounjaro.  She also needs to talk to GI about the chronic constipation and chronic nausea.  Follow-up here if needed.     ED Prescriptions     Medication Sig Dispense Auth. Provider   ondansetron  (ZOFRAN -ODT) 4 MG disintegrating tablet Take 1 tablet (4 mg total) by mouth every 8 (eight) hours as needed for nausea or vomiting. 20 tablet Rudene Poulsen, FNP      PDMP not reviewed this encounter.   Ival Domino, FNP 11/15/23 [phone]

## 2023-11-15 NOTE — ED Triage Notes (Signed)
 Pt c/o nausea for several weeks. She has a hx of constipation and was seen by her gastroenterologist a few weeks ago and they switcher her IBS meds from linzess to a new med (could not name the medication). Since switching her medications she has been feeling nauseous daily and having loss of appetite. Denies vomiting. She thought it was due to the new medication so she stopped taking it for the last 5 days but she has continued to have the nauseous feeling. She has taken ondansetron  with slight relief.

## 2023-11-15 NOTE — Progress Notes (Signed)
 X-ray shows a moderate amount of gas in the bowel but was otherwise normal.  Patient was given this update during her visit.

## 2023-11-15 NOTE — Discharge Instructions (Addendum)
 Abdominal pain with constipation nausea: Moderate gas on the abdominal x-ray.  No sign of any significant constipation at this time.  Ondansetron , 4 mg, melts on tongue, every 8 hours if needed for nausea or vomiting.  Get plenty of fluids.  Patient may need to stop Mounjaro.  It may be worsening all of her symptoms.  She should speak to primary care and/or GI about the Mounjaro.  She also needs to talk to GI about the chronic constipation and chronic nausea.  Follow-up here if needed.

## 2023-11-16 ENCOUNTER — Ambulatory Visit: Admitting: Cardiology

## 2023-11-25 ENCOUNTER — Ambulatory Visit: Admitting: Rheumatology

## 2023-11-29 ENCOUNTER — Other Ambulatory Visit: Payer: Self-pay | Admitting: *Deleted

## 2023-11-30 ENCOUNTER — Encounter: Payer: Self-pay | Admitting: Cardiology

## 2023-11-30 ENCOUNTER — Ambulatory Visit: Attending: Cardiology

## 2023-11-30 ENCOUNTER — Ambulatory Visit: Attending: Cardiology | Admitting: Cardiology

## 2023-11-30 VITALS — BP 132/80 | HR 81 | Ht 60.63 in | Wt 132.0 lb

## 2023-11-30 DIAGNOSIS — R079 Chest pain, unspecified: Secondary | ICD-10-CM

## 2023-11-30 DIAGNOSIS — R0609 Other forms of dyspnea: Secondary | ICD-10-CM | POA: Diagnosis not present

## 2023-11-30 DIAGNOSIS — R002 Palpitations: Secondary | ICD-10-CM | POA: Diagnosis not present

## 2023-11-30 DIAGNOSIS — I2510 Atherosclerotic heart disease of native coronary artery without angina pectoris: Secondary | ICD-10-CM

## 2023-11-30 DIAGNOSIS — G4733 Obstructive sleep apnea (adult) (pediatric): Secondary | ICD-10-CM

## 2023-11-30 DIAGNOSIS — E119 Type 2 diabetes mellitus without complications: Secondary | ICD-10-CM

## 2023-11-30 DIAGNOSIS — D862 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: Secondary | ICD-10-CM

## 2023-11-30 NOTE — Progress Notes (Signed)
 Cardiology Consultation:    Date:  11/30/2023   ID:  Melanie Meyers, Melanie Meyers 1955/11/14, MRN [id number]  PCP:  Melanie Norleen PHEBE PONCE, MD  Cardiologist:  Melanie Fitch, MD   Referring MD: Melanie Norleen PHEBE PONCE, MD   No chief complaint on file. Would like to be checked  History of Present Illness:    Melanie Meyers is a 68 y.o. female who is being seen today for the evaluation of multiple issues at the request of [place], John F. II, MD. past medical history significant for coronary artery disease, recent premature 2019 she had cardiac catheterization done which showed proximal right coronary artery with 30% stenosis, proximal LAD 35% stenosis of ramus 50% stenosis ejection fraction was normal.  In 2021 she did have echocardiogram showed preserved ejection fraction mild left ventricular hypertrophy.  She also wore a Zio patch which showed some ventricular and supraventricular ectopy.  She said that few years ago she ended up having cardiac catheterization stent placed in her heart but she does not remember exactly which artery it was she comes today to my office after not being seen for a while.  She complained of being weak fatigued tired.  She was given Mounjaro and apparently did not tolerate as well, weight loss significant mount of weight now complaining of having some palpitations.  She said that she could be sitting and suddenly her heart will speed up and accelerate.  That last typically for few seconds does not make her dizzy or short of breath.  She also complained of having exertional shortness of breath more than usually she blamed this may be on some weight loss and not doing well overall but gradually she said she is getting better.  Described to have some chest pain but atypical does have significant neuropathy following which she took Lyrica , Lyrica  has been discontinue when they were trying to figure out why she is not feeling well.  She does not smoke but does have  secondhand smoking, does not exercise on a regular basis, not on any special diet  Past Medical History:  Diagnosis Date   Asthma    B12 deficiency    CAD (coronary artery disease) 11/10/2017   Known to Cardiology   Chronic tension-type headache, not intractable 07/10/2016   Degenerative arthritis    degenerative arthritis of neck   Diabetic peripheral neuropathy (HCC) 05/17/2016   Fibromyalgia    GERD (gastroesophageal reflux disease)    Hyperlipidemia 06/20/2015   Hypertension    Hypothyroidism 06/02/2013   Insomnia    Major depressive disorder, recurrent episode with anxious distress    Obstructive sleep apnea    On CPAP   Osteoarthritis    Osteopenia    Raynaud's disease    f/by [facility]   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 Mardee   Vitamin D deficiency     Past Surgical History:  Procedure Laterality Date   ANTERIOR CERVICAL DECOMP/DISCECTOMY FUSION N/A 10/06/2017   Procedure: ANTERIOR CERVICAL DECOMPRESSION/DISCECTOMY FUSION C4-7;  Surgeon: Burnetta Aures, MD;  Location: [facility] OR;  Service: Orthopedics;  Laterality: N/A;  4.5 hrs   BREAST REDUCTION SURGERY  02/18/2015   cardiac stent  2023   CARPAL TUNNEL RELEASE  2014   CARPAL TUNNEL RELEASE  04/2016   CESAREAN SECTION  1981, 1982, 1988   LEFT HEART CATH AND CORONARY ANGIOGRAPHY N/A 09/29/2017   Procedure: LEFT HEART CATH AND CORONARY ANGIOGRAPHY;  Surgeon: Jordan, Peter  M, MD;  Location: [facility] INVASIVE [facility];  Service: Cardiovascular;  Laterality: N/A;   NASAL POLYP SURGERY  1983   REPLACEMENT TOTAL KNEE Bilateral 2023   SINOSCOPY  10/09/2014   TONSILLECTOMY  1978   TUBAL LIGATION      Current Medications: Current Meds  Medication Sig   albuterol  (VENTOLIN  HFA) 108 (90 Base) MCG/ACT inhaler Inhale 2 puffs into the lungs every 6 (six) hours as needed for wheezing or shortness of breath.    aspirin  EC 81 MG tablet Take 81 mg by mouth at bedtime.    Capsicum,  Cayenne, (CAYENNE PEPPER PO) Take by mouth daily.   diclofenac  Sodium (VOLTAREN ) 1 % GEL Apply 2-3 application topically 4 (four) times daily.   levothyroxine  (SYNTHROID , LEVOTHROID) 25 MCG tablet Take 25 mcg by mouth daily before breakfast.   lidocaine  (LIDODERM ) 5 % Place 1 patch onto the skin as needed.    lubiprostone (AMITIZA) 24 MCG capsule Take 24 mcg by mouth 2 (two) times daily.   MAGNESIUM  PO Take by mouth daily.   meclizine (ANTIVERT) 12.5 MG tablet Take 1-2 tablets by mouth every 4 (four) hours as needed.   Menthol , Topical Analgesic, (BIOFREEZE EX) Apply 1 application topically daily as needed (muscle pain).   Multiple Vitamin (MULTIVITAMIN WITH MINERALS) TABS tablet Take 1 tablet by mouth at bedtime.   ondansetron  (ZOFRAN -ODT) 4 MG disintegrating tablet Take 1 tablet (4 mg total) by mouth every 8 (eight) hours as needed for nausea or vomiting.   polyethylene glycol (MIRALAX  / GLYCOLAX ) packet Take 17 g by mouth daily as needed for mild constipation.    rizatriptan (MAXALT) 10 MG tablet Take 10 mg by mouth as needed for migraine.    silver sulfADIAZINE (SILVADENE) 1 % cream as needed.   TART CHERRY PO Take by mouth daily.     Allergies:   Accupril [quinapril hcl], Prednisone, Levaquin [levofloxacin], and Penicillin g   Social History   Socioeconomic History   Marital status: Widowed    Spouse name: Lives alone with child nearby   Number of children: Not on file   Years of education: 13   Highest education level: Some college, no degree  Occupational History   Occupation: Retired day occupational hygienist  Tobacco Use   Smoking status: Never    Passive exposure: Past   Smokeless tobacco: Never  Vaping Use   Vaping status: Never Used  Substance and Sexual Activity   Alcohol use: No    Alcohol/week: 0.0 standard drinks of alcohol   Drug use: No   Sexual activity: Not on file  Other Topics Concern   Not on file  Social History Narrative   Not on file   Social Drivers of  Health   Financial Resource Strain: Medium Risk (11/08/2023)   Overall Financial Resource Strain (CARDIA)    Difficulty of Paying Living Expenses: Somewhat hard  Food Insecurity: Food Insecurity Present (11/08/2023)   Hunger Vital Sign    Worried About Running Out of Food in the Last Year: Sometimes true    Ran Out of Food in the Last Year: Sometimes true  Transportation Needs: No Transportation Needs (11/08/2023)   PRAPARE - Administrator, Civil Service (Medical): No    Lack of Transportation (Non-Medical): No  Physical Activity: Insufficiently Active (11/08/2023)   Exercise Vital Sign    Days of Exercise per Week: 4 days    Minutes of Exercise per Session: 20 min  Stress: Stress Concern Present (11/08/2023)  [employer] of Occupational Health - Occupational Stress Questionnaire    Feeling of Stress: To some extent  Social Connections: Moderately Integrated (11/08/2023)   Social Connection and Isolation Panel    Frequency of Communication with Friends and Family: More than three times a week    Frequency of Social Gatherings with Friends and Family: Once a week    Attends Religious Services: More than 4 times per year    Active Member of [employer] or Organizations: Yes    Attends Banker Meetings: More than 4 times per year    Marital Status: Widowed     Family History: The patient's family history includes Allergies in her daughter; Alzheimer's disease in her mother; Asthma in her daughter; Autoimmune disease in her son; Diabetes in her mother; Gout in her mother and sister; Heart attack in her father and sister; High Cholesterol in her father; High blood pressure in her father; Parkinson's disease in her mother; Rheum arthritis in her cousin; Thyroid  disease in her daughter, sister, sister, and sister. ROS:   Please see the history of present illness.    All 14 point review of systems negative except as described per history of present  illness.  EKGs/Labs/Other Studies Reviewed:    The following studies were reviewed today:   EKG:  EKG Interpretation Date/Time:  Tuesday November 30 2023 15:04:35 EST Ventricular Rate:  81 PR Interval:  126 QRS Duration:  86 QT Interval:  398 QTC Calculation: 462 R Axis:   55  Text Interpretation: Sinus rhythm with marked sinus arrhythmia When compared with ECG of 25-Jul-2015 01:23, PREVIOUS ECG IS PRESENT Confirmed by Bernie Charleston [phone]) on 11/30/2023 3:18:05 PM    Recent Labs: 10/26/2023: TSH 1.57  Recent Lipid Panel    Component Value Date/Time   CHOL 222 (H) 07/25/2019 1343   TRIG 284 (H) 07/25/2019 1343   HDL 52 07/25/2019 1343   CHOLHDL 4.3 07/25/2019 1343   LDLCALC 120 (H) 07/25/2019 1343    Physical Exam:    VS:  BP 132/80   Pulse 81   Ht 5' 0.63 (1.54 m)   Wt 132 lb (59.9 kg)   LMP  (LMP Unknown) Comment: tubal ligation  SpO2 97%   BMI 25.25 kg/m     Wt Readings from Last 3 Encounters:  11/30/23 132 lb (59.9 kg)  11/09/23 136 lb 1.6 oz (61.7 kg)  11/03/23 134 lb 9.6 oz (61.1 kg)     GEN:  Well nourished, well developed in no acute distress HEENT: Normal NECK: No JVD; No carotid bruits LYMPHATICS: No lymphadenopathy CARDIAC: RRR, no murmurs, no rubs, no gallops RESPIRATORY:  Clear to auscultation without rales, wheezing or rhonchi  ABDOMEN: Soft, non-tender, non-distended MUSCULOSKELETAL:  No edema; No deformity  SKIN: Warm and dry NEUROLOGIC:  Alert and oriented x 3 PSYCHIATRIC:  Normal affect   ASSESSMENT:    1. Coronary artery disease, unspecified vessel or lesion type, unspecified whether angina present, unspecified whether native or transplanted heart   2. Dyspnea on exertion   3. Chest pain of uncertain etiology   4. Palpitations   5. Obstructive sleep apnea   6. 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 Type Meyers Diabetes Mellitus without complication (HCC)    PLAN:    In order of problems listed  above:  Coronary disease history of PTCA and stenting couple years ago with abdominal which artery.  She does have constellation of atypical symptoms I will schedule her to  have a stress test especially in view of the fact that she does have significant neuropathy which may mask her symptomatology.  In the meantime continue present medications which include antiplatelets therapy. Dyspnea exertion: Echocardiogram will be done. Palpitations I will place a Zio patch on her for 2 weeks to see if she get any significant arrhythmia. Dyslipidemia, we will schedule her to have fasting lipid profile   Medication Adjustments/Labs and Tests Ordered: Current medicines are reviewed at length with the patient today.  Concerns regarding medicines are outlined above.  Orders Placed This Encounter  Procedures   LONG TERM MONITOR (3-14 DAYS)   MYOCARDIAL PERFUSION IMAGING   EKG 12-Lead   ECHOCARDIOGRAM COMPLETE   No orders of the defined types were placed in this encounter.   Signed, Melanie DOROTHA Fitch, MD, [facility]. 11/30/2023 4:54 PM    [facility]

## 2023-11-30 NOTE — Patient Instructions (Addendum)
 Medication Instructions:  Your physician recommends that you continue on your current medications as directed. Please refer to the Current Medication list given to you today.  *If you need a refill on your cardiac medications before your next appointment, please call your pharmacy*   Lab Work: None Ordered If you have labs (blood work) drawn today and your tests are completely normal, you will receive your results only by: MyChart Message (if you have MyChart) OR A paper copy in the mail If you have any lab test that is abnormal or we need to change your treatment, we will call you to review the results.   Testing/Procedures:   WHY IS MY DOCTOR PRESCRIBING ZIO? The Zio system is proven and trusted by physicians to detect and diagnose irregular heart rhythms -- and has been prescribed to hundreds of thousands of patients.  The FDA has cleared the Zio system to monitor for many different kinds of irregular heart rhythms. In a study, physicians were able to reach a diagnosis 90% of the time with the Zio system1.  You can wear the Zio monitor -- a small, discreet, comfortable patch -- during your normal day-to-day activity, including while you sleep, shower, and exercise, while it records every single heartbeat for analysis.  1Barrett, P., et al. Comparison of 24 Hour Holter Monitoring Versus 14 Day Novel Adhesive Patch Electrocardiographic Monitoring. American Journal of Medicine, 2014.  ZIO VS. HOLTER MONITORING The Zio monitor can be comfortably worn for up to 14 days. Holter monitors can be worn for 24 to 48 hours, limiting the time to record any irregular heart rhythms you may have. Zio is able to capture data for the 51% of patients who have their first symptom-triggered arrhythmia after 48 hours.1  LIVE WITHOUT RESTRICTIONS The Zio ambulatory cardiac monitor is a small, unobtrusive, and water-resistant patch--you might even forget you're wearing it. The Zio monitor records and stores  every beat of your heart, whether you're sleeping, working out, or showering.    Your physician has requested that you have a lexiscan  myoview. For further information please visit [email or web address] Please follow instruction sheet, as given.  The test will take approximately 3 to 4 hours to complete; you may bring reading material.  If someone comes with you to your appointment, they will need to remain in the main lobby due to limited space in the testing area. **If you are pregnant or breastfeeding, please notify the nuclear lab prior to your appointment**  How to prepare for your Myocardial Perfusion Test: Do not eat or drink 3 hours prior to your test, except you may have water. Do not consume products containing caffeine (regular or decaffeinated) 12 hours prior to your test. (ex: coffee, chocolate, sodas, tea). Do bring a list of your current medications with you.  If not listed below, you may take your medications as normal. Do wear comfortable clothes (no dresses or overalls) and walking shoes, tennis shoes preferred (No heels or open toe shoes are allowed). Do NOT wear cologne, perfume, aftershave, or lotions (deodorant is allowed). If these instructions are not followed, your test will have to be rescheduled.    Your physician has requested that you have an echocardiogram. Echocardiography is a painless test that uses sound waves to create images of your heart. It provides your doctor with information about the size and shape of your heart and how well your heart's chambers and valves are working. This procedure takes approximately one hour. There are no restrictions for this procedure.  Please do NOT wear cologne, perfume, aftershave, or lotions (deodorant is allowed). Please arrive 15 minutes prior to your appointment time.  Please note: We ask at that you not bring children with you during ultrasound (echo/ vascular) testing. Due to room size and safety concerns, children are not  allowed in the ultrasound rooms during exams. Our front office staff cannot provide observation of children in our lobby area while testing is being conducted. An adult accompanying a patient to their appointment will only be allowed in the ultrasound room at the discretion of the ultrasound technician under special circumstances. We apologize for any inconvenience.    Follow-Up: At [facility], you and your health needs are our priority.  As part of our continuing mission to provide you with exceptional heart care, we have created designated Provider Care Teams.  These Care Teams include your primary Cardiologist (physician) and Advanced Practice Providers (APPs -  Physician Assistants and Nurse Practitioners) who all work together to provide you with the care you need, when you need it.  We recommend signing up for the patient portal called MyChart.  Sign up information is provided on this After Visit Summary.  MyChart is used to connect with patients for Virtual Visits (Telemedicine).  Patients are able to view lab/test results, encounter notes, upcoming appointments, etc.  Non-urgent messages can be sent to your provider as well.   To learn more about what you can do with MyChart, go to [email or web address].    Your next appointment:   6 week(s)  The format for your next appointment:   In Person  Provider:   Lamar Fitch, MD    Other Instructions NA

## 2023-12-08 ENCOUNTER — Ambulatory Visit

## 2023-12-08 ENCOUNTER — Encounter (HOSPITAL_BASED_OUTPATIENT_CLINIC_OR_DEPARTMENT_OTHER): Payer: Self-pay | Admitting: Family Medicine

## 2023-12-08 ENCOUNTER — Ambulatory Visit (INDEPENDENT_AMBULATORY_CARE_PROVIDER_SITE_OTHER): Admitting: Family Medicine

## 2023-12-08 VITALS — BP 116/55 | HR 78 | Temp 98.6°F | Resp 16 | Wt 134.6 lb

## 2023-12-08 DIAGNOSIS — Z1231 Encounter for screening mammogram for malignant neoplasm of breast: Secondary | ICD-10-CM

## 2023-12-08 DIAGNOSIS — R5383 Other fatigue: Secondary | ICD-10-CM | POA: Diagnosis not present

## 2023-12-08 DIAGNOSIS — E559 Vitamin D deficiency, unspecified: Secondary | ICD-10-CM

## 2023-12-08 DIAGNOSIS — M8580 Other specified disorders of bone density and structure, unspecified site: Secondary | ICD-10-CM | POA: Insufficient documentation

## 2023-12-08 DIAGNOSIS — D510 Vitamin B12 deficiency anemia due to intrinsic factor deficiency: Secondary | ICD-10-CM | POA: Diagnosis not present

## 2023-12-08 DIAGNOSIS — E785 Hyperlipidemia, unspecified: Secondary | ICD-10-CM

## 2023-12-08 DIAGNOSIS — E039 Hypothyroidism, unspecified: Secondary | ICD-10-CM

## 2023-12-08 MED ORDER — CYANOCOBALAMIN 1000 MCG/ML IJ SOLN
1000.0000 ug | Freq: Once | INTRAMUSCULAR | Status: AC
  Administered 2023-12-08: 1000 ug via INTRAMUSCULAR

## 2023-12-08 NOTE — Assessment & Plan Note (Signed)
 Mild.   Obtain additional labs and f/u with her soon.

## 2023-12-08 NOTE — Assessment & Plan Note (Signed)
 Commended on low fat diet and exercise.  Await Lipids and f/u with her soon.

## 2023-12-08 NOTE — Progress Notes (Signed)
 Established Patient Office Visit  Subjective   Patient ID: Melanie Meyers, female    DOB: [birth year]  Age: 68 y.o. MRN: [id number]  Chief Complaint  Patient presents with   Follow-up    Follow-up    F/u as above.  Patient present today stating she feels considerably better.  Please see last note for details.  She has stopped some of her meds and I readily agreed that stopping meds can be quite helpful as long as she consults us  first.  She continues to take quite a few herbals, and I advised her to do this at her own risk.  Limited science on these.  She requests labs, Mammogram, and a Dexascan which I'm happy to assist with.  Extended discussion.  She reports a recent Colonoscopy and was commended.    Discussed the use of AI scribe software for clinical note transcription with the patient, who gave verbal consent to proceed.  History of Present Illness     Past Medical History:  Diagnosis Date   Asthma    B12 deficiency    CAD (coronary artery disease) 11/10/2017   f/by Dr. 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 Mardee   Vitamin D deficiency     Outpatient Encounter Medications as of 12/08/2023  Medication Sig   albuterol  (VENTOLIN  HFA) 108 (90 Base) MCG/ACT inhaler Inhale 2 puffs into the lungs every 6 (six) hours as needed for wheezing or shortness of breath.    aspirin  EC 81 MG tablet Take 81 mg by mouth at bedtime.     Biotin 89999 MCG TABS Take by mouth.   Capsicum, Cayenne, (CAYENNE PEPPER PO) Take by mouth daily.   lidocaine  (LIDODERM ) 5 % Place 1 patch onto the skin as needed.    lubiprostone (AMITIZA) 24 MCG capsule Take 24 mcg by mouth 2 (two) times daily.   MAGNESIUM  PO Take by mouth daily.   meclizine (ANTIVERT) 12.5 MG tablet Take 1-2 tablets by mouth every 4 (four) hours as needed.   Melatonin 10 MG CAPS Take by mouth.   Multiple Vitamin (MULTIVITAMIN WITH MINERALS) TABS tablet Take 1 tablet by mouth at bedtime.   polyethylene glycol (MIRALAX  / GLYCOLAX ) packet Take 17 g by mouth daily as needed for mild constipation.    rizatriptan (MAXALT) 10 MG tablet Take 10 mg by mouth as needed for migraine.    [DISCONTINUED] Ginger, Zingiber officinalis, (GINGER ROOT) 550 MG CAPS Take by mouth.   [DISCONTINUED] Homeopathic Products (ARNICA [place] ) PLLT Take by mouth.   [DISCONTINUED] ondansetron  (ZOFRAN -ODT) 4 MG disintegrating tablet Take 1 tablet (4 mg total) by mouth every 8 (eight) hours as needed for nausea or vomiting.   [DISCONTINUED] silver sulfADIAZINE (SILVADENE) 1 % cream as needed.   [DISCONTINUED] TART CHERRY PO Take by mouth daily.   diclofenac  Sodium (VOLTAREN ) 1 % GEL Apply 2-3 application topically 4 (four) times daily. (Patient not taking:  Reported on 12/08/2023)   levothyroxine  (SYNTHROID , LEVOTHROID) 25 MCG tablet Take 25 mcg by mouth daily before breakfast. (Patient not taking: Reported on 12/08/2023)   Menthol , Topical Analgesic, (BIOFREEZE EX) Apply 1 application topically daily as needed (muscle pain). (Patient not taking: Reported on 12/08/2023)   [EXPIRED] cyanocobalamin (VITAMIN B12) injection 1,000 mcg    No facility-administered encounter medications on file as of 12/08/2023.    Social History   Tobacco Use   Smoking status: Never    Passive exposure: Past   Smokeless tobacco: Never  Vaping Use   Vaping status: Never Used  Substance Use Topics   Alcohol use: No     Alcohol/week: 0.0 standard drinks of alcohol   Drug use: No      Review of Systems  Constitutional:  Negative for diaphoresis, fever, malaise/fatigue and weight loss.  Respiratory:  Negative for cough, shortness of breath and wheezing.   Cardiovascular:  Negative for chest pain, palpitations, orthopnea, claudication, leg swelling and PND.      Objective:     BP (!) 116/55 (Cuff Size: Normal)   Pulse 78   Temp 98.6 F (37 C) (Oral)   Resp 16   Wt 134 lb 9.6 oz (61.1 kg)   LMP  (LMP Unknown) Comment: tubal ligation  SpO2 96%   BMI 25.74 kg/m    Physical Exam Constitutional:      General: She is not in acute distress.    Appearance: Normal appearance.  HENT:     Head: Normocephalic.  Neck:     Vascular: No carotid bruit.  Cardiovascular:     Rate and Rhythm: Normal rate and regular rhythm.     Pulses: Normal pulses.     Heart sounds: Normal heart sounds.  Pulmonary:     Effort: Pulmonary effort is normal.     Breath sounds: Normal breath sounds.  Abdominal:     General: Bowel sounds are normal.     Palpations: Abdomen is soft.  Musculoskeletal:     Cervical back: Neck supple. No tenderness.     Right lower leg: No edema.     Left lower leg: No edema.  Neurological:     Mental Status: She is alert.      No results found for any visits on 12/08/23.    The ASCVD Risk score (Arnett DK, et al., 2019) failed to calculate for the following reasons:   Cannot find a previous HDL lab   Cannot find a previous total cholesterol lab    Assessment & Plan:  Dyslipidemia Assessment & Plan: Commended on low fat diet and exercise.  Await Lipids and f/u with her soon.  Orders: -     Lipid panel  Breast cancer screening by mammogram -     3D Screening Mammogram, Left and Right; Future  Pernicious anemia -     Cyanocobalamin  Fatigue, unspecified type Assessment & Plan: Mild.   Obtain additional labs and f/u with her soon.  Orders: -     CBC with  Differential/Platelet -     Comprehensive metabolic panel with GFR  Vitamin D deficiency -     VITAMIN D 25 Hydroxy (Vit-D Deficiency, Fractures)  Osteopenia, unspecified location Assessment & Plan: Await Dexascan and review those results soon.  Orders: -     DG Bone Density; Future  Acquired hypothyroidism Assessment & Plan: Unclear if she stopped her thyroid  rx w/o telling us  and will check with her soon on this.  Assessment and Plan Assessment & Plan      Return in about 3 months (around 03/07/2024) for chronic follow-up.    REDDING PONCE NORLEEN FALCON., MD

## 2023-12-08 NOTE — Progress Notes (Signed)
 Patient is in office today for a nurse visit for B12 Injection. Patient Injection was given in the  Right deltoid. Patient tolerated injection well.

## 2023-12-08 NOTE — Assessment & Plan Note (Signed)
 Await Dexascan and review those results soon.

## 2023-12-08 NOTE — Assessment & Plan Note (Signed)
 Unclear if she stopped her thyroid  rx w/o telling us  and will check with her soon on this.

## 2023-12-09 ENCOUNTER — Telehealth (HOSPITAL_COMMUNITY): Payer: Self-pay | Admitting: *Deleted

## 2023-12-09 ENCOUNTER — Ambulatory Visit (HOSPITAL_BASED_OUTPATIENT_CLINIC_OR_DEPARTMENT_OTHER): Payer: Self-pay | Admitting: Family Medicine

## 2023-12-09 LAB — COMPREHENSIVE METABOLIC PANEL WITH GFR
ALT: 15 IU/L (ref 0–32)
AST: 20 IU/L (ref 0–40)
Albumin: 4.2 g/dL (ref 3.9–4.9)
Alkaline Phosphatase: 83 IU/L (ref 49–135)
BUN/Creatinine Ratio: 17 (ref 12–28)
BUN: 17 mg/dL (ref 8–27)
Bilirubin Total: 0.3 mg/dL (ref 0.0–1.2)
CO2: 25 mmol/L (ref 20–29)
Calcium: 9.6 mg/dL (ref 8.7–10.3)
Chloride: 102 mmol/L (ref 96–106)
Creatinine, Ser: 1.01 mg/dL — ABNORMAL HIGH (ref 0.57–1.00)
Globulin, Total: 2.5 g/dL (ref 1.5–4.5)
Glucose: 114 mg/dL — ABNORMAL HIGH (ref 70–99)
Potassium: 4.3 mmol/L (ref 3.5–5.2)
Sodium: 141 mmol/L (ref 134–144)
Total Protein: 6.7 g/dL (ref 6.0–8.5)
eGFR: 61 mL/min/1.73 (ref >59)

## 2023-12-09 LAB — LIPID PANEL
Chol/HDL Ratio: 3 ratio (ref 0.0–4.4)
Cholesterol, Total: 249 mg/dL — ABNORMAL HIGH (ref 100–199)
HDL: 82 mg/dL (ref >39)
LDL Chol Calc (NIH): 147 mg/dL — ABNORMAL HIGH (ref 0–99)
Triglycerides: 116 mg/dL (ref 0–149)
VLDL Cholesterol Cal: 20 mg/dL (ref 5–40)

## 2023-12-09 LAB — CBC WITH DIFFERENTIAL/PLATELET
Basophils Absolute: 0.1 x10E3/uL (ref 0.0–0.2)
Basos: 1 %
EOS (ABSOLUTE): 0.1 x10E3/uL (ref 0.0–0.4)
Eos: 2 %
Hematocrit: 45.3 % (ref 34.0–46.6)
Hemoglobin: 14.6 g/dL (ref 11.1–15.9)
Immature Grans (Abs): 0 x10E3/uL (ref 0.0–0.1)
Immature Granulocytes: 0 %
Lymphocytes Absolute: 2.1 x10E3/uL (ref 0.7–3.1)
Lymphs: 37 %
MCH: 29 pg (ref 26.6–33.0)
MCHC: 32.2 g/dL (ref 31.5–35.7)
MCV: 90 fL (ref 79–97)
Monocytes Absolute: 0.5 x10E3/uL (ref 0.1–0.9)
Monocytes: 10 %
Neutrophils Absolute: 2.8 x10E3/uL (ref 1.4–7.0)
Neutrophils: 50 %
Platelets: 332 x10E3/uL (ref 150–450)
RBC: 5.03 x10E6/uL (ref 3.77–5.28)
RDW: 13.3 % (ref 11.7–15.4)
WBC: 5.6 x10E3/uL (ref 3.4–10.8)

## 2023-12-09 LAB — VITAMIN D 25 HYDROXY (VIT D DEFICIENCY, FRACTURES): Vit D, 25-Hydroxy: 39 ng/mL (ref 30.0–100.0)

## 2023-12-09 NOTE — Telephone Encounter (Signed)
 Spoke to patient as a reminder about her STRESS TEST on 12/15/23 at 8:15. Patient stated that she needs to cancel and reschedule to a later date due to personal reasons.

## 2023-12-15 ENCOUNTER — Ambulatory Visit

## 2023-12-21 ENCOUNTER — Ambulatory Visit (INDEPENDENT_AMBULATORY_CARE_PROVIDER_SITE_OTHER)

## 2023-12-21 DIAGNOSIS — D510 Vitamin B12 deficiency anemia due to intrinsic factor deficiency: Secondary | ICD-10-CM

## 2023-12-21 MED ORDER — CYANOCOBALAMIN 1000 MCG/ML IJ SOLN
1000.0000 ug | Freq: Once | INTRAMUSCULAR | Status: AC
  Administered 2023-12-21: 12:00:00 1000 ug via INTRAMUSCULAR

## 2023-12-21 NOTE — Progress Notes (Unsigned)
 Patient is in office today for a nurse visit for B12 Injection. Patient Injection was given in the  Right deltoid. Patient tolerated injection well.

## 2023-12-22 ENCOUNTER — Ambulatory Visit: Attending: Cardiology

## 2023-12-22 DIAGNOSIS — R079 Chest pain, unspecified: Secondary | ICD-10-CM

## 2023-12-22 LAB — MYOCARDIAL PERFUSION IMAGING
LV dias vol: 81 mL (ref 46–106)
LV sys vol: 34 mL (ref 3.8–5.2)
Nuc Stress EF: 57 %
Peak HR: 110 {beats}/min
Rest HR: 64 {beats}/min
Rest Nuclear Isotope Dose: 10.6 mCi
SDS: 2
SRS: 3
SSS: 5
Stress Nuclear Isotope Dose: 31.1 mCi
TID: 1

## 2023-12-22 MED ORDER — TECHNETIUM TC 99M TETROFOSMIN IV KIT
31.1000 | PACK | Freq: Once | INTRAVENOUS | Status: AC | PRN
  Administered 2023-12-22: 09:00:00 31.1 via INTRAVENOUS

## 2023-12-22 MED ORDER — REGADENOSON 0.4 MG/5ML IV SOLN
0.4000 mg | Freq: Once | INTRAVENOUS | Status: AC
  Administered 2023-12-22: 09:00:00 0.4 mg via INTRAVENOUS

## 2023-12-22 MED ORDER — TECHNETIUM TC 99M TETROFOSMIN IV KIT
10.6000 | PACK | Freq: Once | INTRAVENOUS | Status: AC | PRN
  Administered 2023-12-22: 08:00:00 10.6 via INTRAVENOUS

## 2023-12-23 ENCOUNTER — Ambulatory Visit (HOSPITAL_BASED_OUTPATIENT_CLINIC_OR_DEPARTMENT_OTHER): Admitting: Radiology

## 2023-12-23 ENCOUNTER — Ambulatory Visit (HOSPITAL_BASED_OUTPATIENT_CLINIC_OR_DEPARTMENT_OTHER)
Admission: RE | Admit: 2023-12-23 | Discharge: 2023-12-23 | Disposition: A | Discharge status: Home / Self Care | Source: Ambulatory Visit

## 2023-12-23 VITALS — BP 149/76 | HR 90 | Temp 98.4°F | Resp 18

## 2023-12-23 DIAGNOSIS — M545 Low back pain, unspecified: Secondary | ICD-10-CM

## 2023-12-23 DIAGNOSIS — M62830 Muscle spasm of back: Secondary | ICD-10-CM | POA: Diagnosis not present

## 2023-12-23 DIAGNOSIS — M549 Dorsalgia, unspecified: Secondary | ICD-10-CM | POA: Diagnosis not present

## 2023-12-23 MED ORDER — CELECOXIB 200 MG PO CAPS: 200.0000 mg | ORAL_CAPSULE | Freq: Two times a day (BID) | ORAL | 0 refills | Status: AC | PRN

## 2023-12-23 MED ORDER — CYCLOBENZAPRINE HCL 5 MG PO TABS: 5.0000 mg | ORAL_TABLET | Freq: Every evening | ORAL | 0 refills | Status: AC | PRN

## 2023-12-23 NOTE — Discharge Instructions (Addendum)
 Mid and lower back pain with back muscle spasm: X-rays show arthritis but no fractures nor dislocations.  Celebrex  200 mg twice daily with food for back pain.  Cyclobenzaprine , 5 mg, nightly for muscle spasms.  Do not use the cyclobenzaprine  and drive.  Could use Celebrex  and cyclobenzaprine  together.  If symptoms persist follow-up with primary care for further evaluation.  Might need physical therapy or to see orthopedics.

## 2023-12-23 NOTE — ED Triage Notes (Signed)
 Pt c/o lower back pain she was on a paint ladder standing on it and she twisted and felt a pain happened yesterday

## 2023-12-23 NOTE — ED Provider Notes (Signed)
 [facility]    CSN: [id number] Arrival date & time: 12/23/23  0955      History   Chief Complaint Chief Complaint  Patient presents with   Back Pain    My lower right back is hurting. - Entered by patient    HPI Melanie Meyers is a 68 y.o. female.   68 year old female who was on a ladder at her home painting.  She turned to the right and felt significant pain in her mid back and upper buttock.  It happened on 12/22/2023.  She did not fall.  There was no injury nor trauma.  She is having trouble standing up at times and she is having trouble sitting for long periods of time or laying down.  She did not sleep at all last night.  She is concerned she broke a vertebrae or something because she hurts so much.   Back Pain Associated symptoms: no abdominal pain, no chest pain, no dysuria and no fever     Past Medical History:  Diagnosis Date   Asthma    B12 deficiency    CAD (coronary artery disease) 11/10/2017   f/by Dr. 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 Mardee   Vitamin D  deficiency     Patient Active Problem List   Diagnosis Date Noted   Fatigue 12/08/2023   Osteopenia 12/08/2023   Heartburn 11/09/2023   Motion sickness 11/09/2023   Chronic pain syndrome 11/09/2023   Ataxia 11/09/2023   Rupture of right Achilles tendon 06/12/2020   Vitamin D  deficiency    Type II Diabetes Mellitus without  complication (HCC)    Sarcoidosis    Raynaud's disease    Obstructive sleep apnea    Insomnia    GERD (gastroesophageal reflux disease)    Generalized anxiety disorder    Fibromyalgia    Depression    Degenerative arthritis    Back pain    Major depressive disorder, recurrent episode with anxious distress    Acquired hallux rigidus of right foot 04/13/2018   Osteoarthritis of knee 03/28/2018   Pes anserinus bursitis of right knee 03/28/2018   CAD (coronary artery disease) 11/10/2017   Osteoarthritis of finger of right hand 02/10/2017   Chronic tension-type headache, not intractable 07/10/2016   Diabetic peripheral neuropathy (HCC) 05/17/2016   Transient alteration of awareness 10/10/2015   Dyslipidemia 06/20/2015   Metatarsal deformity 05/30/2014   Metatarsalgia 05/30/2014   Hand paresthesia 06/27/2013   Hypothyroidism 06/02/2013   Type 2 diabetes mellitus (HCC) 06/02/2013   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 02/10/2013   Nerve root pain 01/27/2013   Narrowing of intervertebral disc space 01/27/2013   Myofascial pain 01/27/2013   Cervical spine pain 11/24/2012   Cervical osteoarthritis 11/24/2012   Asthma 01/20/2008    Past Surgical History:  Procedure Laterality Date   ANTERIOR CERVICAL DECOMP/DISCECTOMY FUSION N/A 10/06/2017   Procedure: ANTERIOR  CERVICAL DECOMPRESSION/DISCECTOMY FUSION C4-7;  Surgeon: Burnetta Aures, MD;  Location: [facility] OR;  Service: Orthopedics;  Laterality: N/A;  4.5 hrs   BREAST REDUCTION SURGERY  02/18/2015   cardiac stent  2023   CARPAL TUNNEL RELEASE  2014   CARPAL TUNNEL RELEASE  04/2016   CESAREAN SECTION  1981, 1982, 1988   COLONOSCOPY N/A    2025 in [facility]   LEFT HEART CATH AND CORONARY ANGIOGRAPHY N/A 09/29/2017   Procedure: LEFT HEART CATH AND CORONARY ANGIOGRAPHY;  Surgeon: Jordan, Peter M, MD;  Location: [facility] INVASIVE CV LAB;  Service: Cardiovascular;  Laterality: N/A;   NASAL POLYP SURGERY  1983   REPLACEMENT TOTAL  KNEE Bilateral 2023   SINOSCOPY  10/09/2014   TONSILLECTOMY  1978   TUBAL LIGATION      OB History   No obstetric history on file.      Home Medications    Prior to Admission medications  Medication Sig Start Date End Date Taking? Authorizing Provider  aspirin  EC 81 MG tablet Take 81 mg by mouth at bedtime.    Yes [provider]  Biotin 89999 MCG TABS Take by mouth.   Yes [provider]  Capsicum, Cayenne, (CAYENNE PEPPER PO) Take by mouth daily.   Yes [provider]  celecoxib  (CELEBREX ) 200 MG capsule Take 1 capsule (200 mg total) by mouth 2 (two) times daily as needed for up to 15 days. 12/23/23 01/07/24 Yes Ival Domino, FNP  cyclobenzaprine  (FLEXERIL ) 5 MG tablet Take 1 tablet (5 mg total) by mouth at bedtime as needed for up to 15 days for muscle spasms. 12/23/23 01/07/24 Yes Ival Domino, FNP  lubiprostone (AMITIZA) 24 MCG capsule Take 24 mcg by mouth 2 (two) times daily. 10/28/23  Yes [provider]  MAGNESIUM  PO Take by mouth daily.   Yes [provider]  albuterol  (VENTOLIN  HFA) 108 (90 Base) MCG/ACT inhaler Inhale 2 puffs into the lungs every 6 (six) hours as needed for wheezing or shortness of breath.     [provider]  lidocaine  (LIDODERM ) 5 % Place 1 patch onto the skin as needed.  02/18/18   [provider]  meclizine (ANTIVERT) 12.5 MG tablet Take 1-2 tablets by mouth every 4 (four) hours as needed. 06/29/19   [provider]  Melatonin 10 MG CAPS Take by mouth.    [provider]  Multiple Vitamin (MULTIVITAMIN WITH MINERALS) TABS tablet Take 1 tablet by mouth at bedtime.    [provider]  polyethylene glycol (MIRALAX  / GLYCOLAX ) packet Take 17 g by mouth daily as needed for mild constipation.     [provider]  rizatriptan (MAXALT) 10 MG tablet Take 10 mg by mouth as needed for migraine.  02/07/16   [provider]    Family History Family History   Problem Relation Age of Onset   Parkinson's disease Mother    Diabetes Mother    Alzheimer's disease Mother    Gout Mother    High blood pressure Father    High Cholesterol Father    Heart attack Father    Thyroid  disease Sister    Heart attack Sister    Thyroid  disease Sister    Gout Sister    Thyroid  disease Sister    Allergies Daughter    Thyroid  disease Daughter    Asthma Daughter    Autoimmune disease Son    Rheum arthritis Cousin     Social History Social History[1]   Allergies  Accupril [quinapril hcl], Prednisone, Levaquin [levofloxacin], and Penicillin g   Review of Systems Review of Systems  Constitutional:  Negative for chills and fever.  HENT:  Negative for ear pain and sore throat.   Eyes:  Negative for pain and visual disturbance.  Respiratory:  Negative for cough and shortness of breath.   Cardiovascular:  Negative for chest pain and palpitations.  Gastrointestinal:  Negative for abdominal pain, constipation, diarrhea, nausea and vomiting.  Genitourinary:  Negative for dysuria and hematuria.  Musculoskeletal:  Positive for back pain. Negative for arthralgias.  Skin:  Negative for color change and rash.  Neurological:  Negative for seizures and syncope.  All other systems reviewed and are negative.    Physical Exam Triage Vital Signs ED Triage Vitals  Encounter Vitals Group     BP 12/23/23 1027 (!) 149/76     Girls Systolic BP Percentile --      Girls Diastolic BP Percentile --      Boys Systolic BP Percentile --      Boys Diastolic BP Percentile --      Pulse Rate 12/23/23 1027 90     Resp 12/23/23 1027 18     Temp 12/23/23 1027 98.4 F (36.9 C)     Temp Source 12/23/23 1027 Oral     SpO2 12/23/23 1027 98 %     Weight --      Height --      Head Circumference --      Peak Flow --      Pain Score 12/23/23 1026 10     Pain Loc --      Pain Education --      Exclude from Growth Chart --    No data found.  Updated Vital Signs BP (!)  149/76 (BP Location: Left Arm)   Pulse 90   Temp 98.4 F (36.9 C) (Oral)   Resp 18   LMP  (LMP Unknown) Comment: tubal ligation  SpO2 98%   Visual Acuity Right Eye Distance:   Left Eye Distance:   Bilateral Distance:    Right Eye Near:   Left Eye Near:    Bilateral Near:     Physical Exam Vitals and nursing note reviewed.  Constitutional:      General: She is not in acute distress.    Appearance: She is well-developed. She is not ill-appearing or toxic-appearing.  HENT:     Head: Normocephalic and atraumatic.     Right Ear: Hearing, tympanic membrane, ear canal and external ear normal.     Left Ear: Hearing, tympanic membrane, ear canal and external ear normal.     Nose: No congestion or rhinorrhea.     Right Sinus: No maxillary sinus tenderness or frontal sinus tenderness.     Left Sinus: No maxillary sinus tenderness or frontal sinus tenderness.     Mouth/Throat:     Lips: Pink.     Mouth: Mucous membranes are moist.     Pharynx: Uvula midline. No oropharyngeal exudate or posterior oropharyngeal erythema.     Tonsils: No tonsillar exudate.  Eyes:     Conjunctiva/sclera: Conjunctivae normal.     Pupils: Pupils are equal, round, and reactive to light.  Cardiovascular:     Rate and Rhythm: Normal rate and regular rhythm.     Heart sounds: S1 normal and S2 normal. No murmur heard. Pulmonary:     Effort: Pulmonary effort is normal. No respiratory distress.     Breath sounds: Normal breath  sounds. No decreased breath sounds, wheezing, rhonchi or rales.  Abdominal:     General: Bowel sounds are normal.     Palpations: Abdomen is soft.     Tenderness: There is no abdominal tenderness.  Musculoskeletal:        General: No swelling.     Cervical back: Normal and neck supple.     Thoracic back: Tenderness (Minimal tenderness of the mid thoracic back below the ribs.  No bruising noted.) and bony tenderness (Minimal tenderness of the mid thoracic spine below the ribs.  No  bruising noted.) present. No swelling, edema, deformity, signs of trauma, lacerations or spasms. Normal range of motion. No scoliosis.     Lumbar back: Spasms (Right upper buttocks and lower back), tenderness (Moderate to severe central lower back pain that radiates across the right hip and upper buttocks.) and bony tenderness (Lower lumbar spine) present. No swelling, edema, deformity, signs of trauma or lacerations. Normal range of motion. No scoliosis.  Lymphadenopathy:     Head:     Right side of head: No submental, submandibular, tonsillar, preauricular or posterior auricular adenopathy.     Left side of head: No submental, submandibular, tonsillar, preauricular or posterior auricular adenopathy.     Cervical: No cervical adenopathy.     Right cervical: No superficial cervical adenopathy.    Left cervical: No superficial cervical adenopathy.  Skin:    General: Skin is warm and dry.     Capillary Refill: Capillary refill takes less than 2 seconds.     Findings: No rash.  Neurological:     Mental Status: She is alert and oriented to person, place, and time.     Deep Tendon Reflexes: Reflexes are normal and symmetric.  Psychiatric:        Mood and Affect: Mood normal.      UC Treatments / Results  Labs (all labs ordered are listed, but only abnormal results are displayed) Comprehensive metabolic panel with GFR: 12/08/23:     Component Ref Range & Units (hover) 2 wk ago (12/08/23)  Glucose 114 High   BUN 17  Creatinine, Ser 1.01 High   eGFR 61  BUN/Creatinine Ratio 17  Sodium 141  Potassium 4.3  Chloride 102  CO2 25  Calcium 9.6  Total Protein 6.7  Albumin 4.2  Globulin, Total 2.5  Bilirubin Total 0.3  Alkaline Phosphatase 83  AST 20  ALT 15  Resulting Agency [facility]      EKG   Radiology DG Thoracic Spine 2 View Result Date: 12/23/2023 CLINICAL DATA:  Back pain after twisting injury yesterday. EXAM: THORACIC SPINE 2 VIEWS COMPARISON:  CT 07/06/2023 FINDINGS:  Minimal biphasic curvature of the thoracolumbar spine. Vertebral body heights are normal. Mild spondylosis throughout the thoracic spine. No acute compression fracture or subluxation. Pedicles are intact. Partially visualized fusion hardware over the cervical spine. IMPRESSION: 1. No acute findings. 2. Mild spondylosis of the thoracic spine. Electronically Signed   By: Toribio Agreste M.D.   On: 12/23/2023 11:52   DG Lumbar Spine Complete Result Date: 12/23/2023 CLINICAL DATA:  Low back pain after twisting injury. EXAM: LUMBAR SPINE - COMPLETE 4+ VIEW COMPARISON:  06/29/2019 FINDINGS: Minimal curvature of the thoracolumbar spine convex left. Vertebral body heights are normal. There is mild spondylosis throughout the lumbar spine to include facet arthropathy. Grade 1-2 anterolisthesis of L4 on L5 with possible slight interval progression. This is likely due to the facet arthropathy. No definite visualized spondylolysis. Moderate associated disc space narrowing at  the L4-5 level. IMPRESSION: 1. No acute findings. 2. Mild spondylosis of the lumbar spine with grade 1-2 anterolisthesis of L4 on L5 with possible slight interval progression. Moderate disc disease at the L4-5 level. Electronically Signed   By: Toribio Agreste M.D.   On: 12/23/2023 11:50   MYOCARDIAL PERFUSION IMAGING Result Date: 12/22/2023   The study is normal. The study is low risk.   Left ventricular function is normal. Nuclear stress EF: 57%. The left ventricular ejection fraction is normal (55-65%). End diastolic cavity size is normal.    Procedures Procedures (including critical care time)  Medications Ordered in UC Medications - No data to display  Initial Impression / Assessment and Plan / UC Course  I have reviewed the triage vital signs and the nursing notes.  Pertinent labs & imaging results that were available during my care of the patient were reviewed by me and considered in my medical decision making (see chart for  details).  Plan of Care (see discharge instructions for additional patient precautions and education): Mid and lower back pain with back muscle spasm:  X-rays show arthritis but no fractures nor dislocations.   Celebrex  200 mg twice daily with food for back pain.   Cyclobenzaprine , 5 mg, nightly for muscle spasms.  Do not use the cyclobenzaprine  and drive.  Could use Celebrex  and cyclobenzaprine  together.   If symptoms persist follow-up with primary care for further evaluation.  Might need physical therapy or to see orthopedics.  I reviewed the plan of care with the patient and/or the patient's guardian.  The patient and/or guardian had time to ask questions and acknowledged that the questions were answered.  Final Clinical Impressions(s) / UC Diagnoses   Final diagnoses:  Mid back pain on right side  Acute right-sided low back pain without sciatica  Muscle spasm of back     Discharge Instructions      Mid and lower back pain with back muscle spasm: X-rays show arthritis but no fractures nor dislocations.  Celebrex  200 mg twice daily with food for back pain.  Cyclobenzaprine , 5 mg, nightly for muscle spasms.  Do not use the cyclobenzaprine  and drive.  Could use Celebrex  and cyclobenzaprine  together.  If symptoms persist follow-up with primary care for further evaluation.  Might need physical therapy or to see orthopedics.     ED Prescriptions     Medication Sig Dispense Auth. Provider   cyclobenzaprine  (FLEXERIL ) 5 MG tablet Take 1 tablet (5 mg total) by mouth at bedtime as needed for up to 15 days for muscle spasms. 15 tablet Junell Cullifer, FNP   celecoxib  (CELEBREX ) 200 MG capsule Take 1 capsule (200 mg total) by mouth 2 (two) times daily as needed for up to 15 days. 30 capsule Ival Domino, FNP      PDMP not reviewed this encounter.     [1]  Social History Tobacco Use   Smoking status: Never    Passive exposure: Past   Smokeless tobacco: Never  Vaping Use   Vaping  status: Never Used  Substance Use Topics   Alcohol use: No    Alcohol/week: 0.0 standard drinks of alcohol   Drug use: No     Ival Domino, FNP 12/23/23 1159

## 2023-12-24 ENCOUNTER — Ambulatory Visit: Payer: Self-pay | Admitting: Cardiology

## 2023-12-27 ENCOUNTER — Ambulatory Visit (INDEPENDENT_AMBULATORY_CARE_PROVIDER_SITE_OTHER)

## 2023-12-27 DIAGNOSIS — R002 Palpitations: Secondary | ICD-10-CM | POA: Diagnosis not present

## 2023-12-27 DIAGNOSIS — D510 Vitamin B12 deficiency anemia due to intrinsic factor deficiency: Secondary | ICD-10-CM

## 2023-12-27 MED ORDER — CYANOCOBALAMIN 1000 MCG/ML IJ SOLN
1000.0000 ug | Freq: Once | INTRAMUSCULAR | Status: AC
  Administered 2023-12-27: 1000 ug via INTRAMUSCULAR

## 2023-12-27 NOTE — Progress Notes (Signed)
 Patient is in office today for a nurse visit for B12 Injection. Patient Injection was given in the  Right deltoid. Patient tolerated injection well.

## 2023-12-28 NOTE — Telephone Encounter (Signed)
 Patient has reviewed stress test results reviewed by Dr. Krasowski on MyChart.  Alan, RN

## 2023-12-29 ENCOUNTER — Ambulatory Visit: Attending: Cardiology

## 2023-12-29 DIAGNOSIS — R0609 Other forms of dyspnea: Secondary | ICD-10-CM

## 2023-12-29 LAB — ECHOCARDIOGRAM COMPLETE
Area-P 1/2: 3.15 cm2
MV M vel: 2.78 m/s
MV Peak grad: 30.9 mmHg
S' Lateral: 2.7 cm

## 2024-01-03 MED ORDER — DILTIAZEM HCL ER COATED BEADS 120 MG PO CP24: 120.0000 mg | ORAL_CAPSULE | Freq: Every day | ORAL | 3 refills | Status: DC

## 2024-01-03 NOTE — Telephone Encounter (Signed)
 MyChart message regarding echo results read on 01/03/24.  Alan, RN

## 2024-01-04 ENCOUNTER — Ambulatory Visit (HOSPITAL_BASED_OUTPATIENT_CLINIC_OR_DEPARTMENT_OTHER): Admitting: *Deleted

## 2024-01-04 DIAGNOSIS — D510 Vitamin B12 deficiency anemia due to intrinsic factor deficiency: Secondary | ICD-10-CM

## 2024-01-04 MED ORDER — CYANOCOBALAMIN 1000 MCG/ML IJ SOLN
1000.0000 ug | Freq: Once | INTRAMUSCULAR | Status: AC
  Administered 2024-01-04: 1000 ug via INTRAMUSCULAR

## 2024-01-04 NOTE — Progress Notes (Unsigned)
 Patient is in office today for a nurse visit for B12 Injection. Patient Injection was given in the  Right deltoid. Patient tolerated injection well.

## 2024-01-07 ENCOUNTER — Other Ambulatory Visit (HOSPITAL_BASED_OUTPATIENT_CLINIC_OR_DEPARTMENT_OTHER): Payer: Self-pay | Admitting: Family Medicine

## 2024-01-07 DIAGNOSIS — G894 Chronic pain syndrome: Secondary | ICD-10-CM

## 2024-01-10 ENCOUNTER — Ambulatory Visit (INDEPENDENT_AMBULATORY_CARE_PROVIDER_SITE_OTHER)
Admission: RE | Admit: 2024-01-10 | Discharge: 2024-01-10 | Disposition: A | Discharge status: Home / Self Care | Source: Ambulatory Visit | Attending: Family Medicine | Admitting: Family Medicine

## 2024-01-10 DIAGNOSIS — Z1231 Encounter for screening mammogram for malignant neoplasm of breast: Secondary | ICD-10-CM | POA: Diagnosis not present

## 2024-01-11 ENCOUNTER — Ambulatory Visit: Payer: Self-pay

## 2024-01-11 ENCOUNTER — Telehealth: Payer: Self-pay

## 2024-01-11 MED ORDER — METOPROLOL SUCCINATE ER 25 MG PO TB24: 25.0000 mg | ORAL_TABLET | Freq: Every day | ORAL | 3 refills | Status: AC

## 2024-01-11 NOTE — Telephone Encounter (Signed)
" °  FYI Only or Action Required?: FYI only for provider: home care.  Patient was last seen in primary care on 12/08/2023 by Dottie Norleen PHEBE PONCE, MD.  Called Nurse Triage reporting Fall.  Symptoms began today.  Interventions attempted: Nothing.  Symptoms are: stable.  Triage Disposition: Home Care  Patient/caregiver understands and will follow disposition?: Yes  Patient wanted appt. Nurse was placed on hold by pt and call dropped.on call back left vm to call office if appt wanted.   Copied from CRM #[id number]. Topic: Clinical - Red Word Triage >> Jan 11, 2024  9:07 AM Carlyon D wrote: Red Word that prompted transfer to Nurse Triage: PT daughter melody is calling in regards to mom calling her telling her she just fell on her front porch, pt hit her face and her head, also hurt her back daughter is not sure what to do. Reason for Disposition  Face bruise, pain, or swelling  Answer Assessment - Initial Assessment Questions 1. MECHANISM: How did the injury happen?      Fell on front porch 2. ONSET: When did the injury happen? (e.g., minutes, hours ago)     This am 3. LOCATION: What part of the face is injured?     Rt cheek and eye 4. APPEARANCE of INJURY: What does the face look like?     Small cut and bruise on cheek 5. BLEEDING: Is it bleeding now? If Yes, ask: Is it difficult to stop?     Small amt bleeding on cheek that has stopped 6. PAIN: Is there pain? If Yes, ask: How bad is the pain?  (Scale 0-10; or none, mild, moderate, severe)     Mild- moderate 7. SIZE: For cuts, bruises, or swelling, ask: How large is it? (e.g., inches or centimeters)      States bruise is small 8. TETANUS: For any breaks in the skin, ask: When was your last tetanus booster?     Up to date 9. OTHER SYMPTOMS: Do you have any other symptoms? (e.g., neck pain, headache, loss of consciousness)     Moving head normally, no neck pain, denies dizziness.  Protocols used: Face Injury-A-AH  "

## 2024-01-11 NOTE — Telephone Encounter (Signed)
 Per Dr. Bernie stop Diltiazem  Start Metoprolol  Succinate 25mg  1 tablet daily

## 2024-01-11 NOTE — Telephone Encounter (Signed)
" °  FYI Only or Action Required?: FYI only for provider: ED advised.  Patient was last seen in primary care on 12/08/2023 by Melanie Norleen PHEBE PONCE, MD.  Called Nurse Triage reporting Fall and Head Injury.  Symptoms began today.  Interventions attempted: Nothing.  Symptoms are: unchanged.  Triage Disposition: Go to ED Now (Notify PCP)  Patient/caregiver understands and will follow disposition?: Yes       YesCopied from CRM [id number]. Topic: Clinical - Red Word Triage >> Jan 11, 2024  9:07 AM Melanie Meyers wrote: Red Word that prompted transfer to Nurse Triage: PT daughter Melanie Meyers is calling in regards to mom calling her telling her she just fell on her front porch, pt hit her face and her head, also hurt her back daughter is not sure what to do. Reason for Disposition  Face bruise, pain, or swelling Reason for Disposition  Sounds like a serious injury to the triager  Answer Assessment - Initial Assessment Questions Patient called into triage stating she spoke with a nurse earlier but was disconnected. She stated she fell on her porch this morning and fell on her face/head. She has bruising noted, and bleeding from her head. ED advised for immediate evaluation as there is no appt. Availability per Epic. Pt. Agrees with plan of care and will go now.  Protocols used: Face Injury-A-AH  "

## 2024-01-12 ENCOUNTER — Encounter: Payer: Self-pay | Admitting: Cardiology

## 2024-01-12 ENCOUNTER — Ambulatory Visit: Attending: Cardiology | Admitting: Cardiology

## 2024-01-12 VITALS — BP 170/80 | HR 62 | Ht 60.0 in | Wt 138.0 lb

## 2024-01-12 DIAGNOSIS — D869 Sarcoidosis, unspecified: Secondary | ICD-10-CM

## 2024-01-12 DIAGNOSIS — G4733 Obstructive sleep apnea (adult) (pediatric): Secondary | ICD-10-CM

## 2024-01-12 DIAGNOSIS — I471 Supraventricular tachycardia, unspecified: Secondary | ICD-10-CM | POA: Diagnosis not present

## 2024-01-12 DIAGNOSIS — E785 Hyperlipidemia, unspecified: Secondary | ICD-10-CM | POA: Diagnosis not present

## 2024-01-12 DIAGNOSIS — I2510 Atherosclerotic heart disease of native coronary artery without angina pectoris: Secondary | ICD-10-CM | POA: Diagnosis not present

## 2024-01-12 MED ORDER — ROSUVASTATIN CALCIUM 5 MG PO TABS: 5.0000 mg | ORAL_TABLET | Freq: Every day | ORAL | 3 refills | Status: AC

## 2024-01-12 NOTE — Progress Notes (Signed)
 " Cardiology Office Note:    Date:  01/12/2024   ID:  Melanie Meyers, DOB 05-05-1955, MRN [id number]  PCP:  Dottie Norleen PHEBE PONCE, MD  Cardiologist:  Lamar Fitch, MD    Referring MD: Dottie Norleen PHEBE PONCE, MD   Chief Complaint  Patient presents with   Follow-up  Doing fine but fell down yesterday  History of Present Illness:    Melanie Meyers is a 69 y.o. female past medical history significant for coronary artery disease she did have some stenting placed long time ago, cardiac catheterization from 2019 showing right coronary artery 30% stenosed proximal, 35% stenosed proximal LAD, 50% ramus, ejection fraction was normal at that time, she came to us  because of symptoms of palpitations.  Zio patch revealed frequent episode of supraventricular tachycardia, stress test has been done showing evidence of ischemia, echocardiogram showed normally functioning heart.  She is seen today in my office for follow-up she did have a fall she turned around and tripped and fell down of a bruise on her right eye.  Went to the emergency room no other injuries.  Described to have some palpitations  Past Medical History:  Diagnosis Date   Asthma    B12 deficiency    CAD (coronary artery disease) 11/10/2017   f/by Dr. Fitch   Chronic tension-type headache, not intractable 07/10/2016   Degenerative arthritis    degenerative arthritis of neck   Diabetic peripheral neuropathy (HCC) 05/17/2016   Fibromyalgia    GERD (gastroesophageal reflux disease)    Hyperlipidemia 06/20/2015   Hypertension    Hypothyroidism 06/02/2013   Insomnia    Major depressive disorder, recurrent episode with anxious distress    Obstructive sleep apnea    On CPAP   Osteoarthritis    Osteopenia    Raynaud's disease    f/by [facility]   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 Mardee   Vitamin D  deficiency     Past Surgical History:  Procedure Laterality Date    ANTERIOR CERVICAL DECOMP/DISCECTOMY FUSION N/A 10/06/2017   Procedure: ANTERIOR CERVICAL DECOMPRESSION/DISCECTOMY FUSION C4-7;  Surgeon: Burnetta Aures, MD;  Location: [facility] OR;  Service: Orthopedics;  Laterality: N/A;  4.5 hrs   BREAST REDUCTION SURGERY  02/18/2015   cardiac stent  2023   CARPAL TUNNEL RELEASE  2014   CARPAL TUNNEL RELEASE  04/2016   CESAREAN SECTION  1981, 1982, 1988   COLONOSCOPY N/A    2025 in [facility]   LEFT HEART CATH AND CORONARY ANGIOGRAPHY N/A 09/29/2017   Procedure: LEFT HEART CATH AND CORONARY ANGIOGRAPHY;  Surgeon: Jordan, Peter M, MD;  Location: [facility] INVASIVE CV LAB;  Service: Cardiovascular;  Laterality: N/A;   NASAL POLYP SURGERY  1983   REPLACEMENT TOTAL KNEE Bilateral 2023   SINOSCOPY  10/09/2014   TONSILLECTOMY  1978   TUBAL LIGATION      Current Medications: Active Medications[1]   Allergies:   Accupril [quinapril hcl], Prednisone, Levaquin [levofloxacin], and Penicillin g   Social History   Socioeconomic History   Marital status: Widowed    Spouse name: Lives alone with child nearby   Number of children: Not on file   Years of education: 13   Highest education level: Some college, no degree  Occupational History   Occupation: Retired engineering geologist  Tobacco Use   Smoking status: Never    Passive exposure: Past   Smokeless tobacco: Never  Vaping Use   Vaping status: Never  Used  Substance and Sexual Activity   Alcohol use: No    Alcohol/week: 0.0 standard drinks of alcohol   Drug use: No   Sexual activity: Not on file  Other Topics Concern   Not on file  Social History Narrative   Not on file   Social Drivers of Health   Tobacco Use: Low Risk (01/12/2024)   Patient History    Smoking Tobacco Use: Never    Smokeless Tobacco Use: Never    Passive Exposure: Past  Financial Resource Strain: Medium Risk (11/08/2023)   Overall Financial Resource Strain (CARDIA)    Difficulty of Paying Living Expenses: Somewhat hard  Food Insecurity:  Food Insecurity Present (11/08/2023)   Epic    Worried About Programme Researcher, Broadcasting/film/video in the Last Year: Sometimes true    Ran Out of Food in the Last Year: Sometimes true  Transportation Needs: No Transportation Needs (11/08/2023)   Epic    Lack of Transportation (Medical): No    Lack of Transportation (Non-Medical): No  Physical Activity: Insufficiently Active (11/08/2023)   Exercise Vital Sign    Days of Exercise per Week: 4 days    Minutes of Exercise per Session: 20 min  Stress: Stress Concern Present (11/08/2023)   [employer] of Occupational Health - Occupational Stress Questionnaire    Feeling of Stress: To some extent  Social Connections: Moderately Integrated (11/08/2023)   Social Connection and Isolation Panel    Frequency of Communication with Friends and Family: More than three times a week    Frequency of Social Gatherings with Friends and Family: Once a week    Attends Religious Services: More than 4 times per year    Active Member of [employer] or Organizations: Yes    Attends Banker Meetings: More than 4 times per year    Marital Status: Widowed  Depression (PHQ2-9): High Risk (11/09/2023)   Depression (PHQ2-9)    PHQ-2 Score: 11  Alcohol Screen: Not on file  Housing: Low Risk (11/08/2023)   Epic    Unable to Pay for Housing in the Last Year: No    Number of Times Moved in the Last Year: 1    Homeless in the Last Year: No  Utilities: Not on file  Health Literacy: Not on file     Family History: The patient's family history includes Allergies in her daughter; Alzheimer's disease in her mother; Asthma in her daughter; Autoimmune disease in her son; Diabetes in her mother; Gout in her mother and sister; Heart attack in her father and sister; High Cholesterol in her father; High blood pressure in her father; Parkinson's disease in her mother; Rheum arthritis in her cousin; Thyroid  disease in her daughter, sister, sister, and sister. ROS:   Please see the history  of present illness.    All 14 point review of systems negative except as described per history of present illness  EKGs/Labs/Other Studies Reviewed:         Recent Labs: 10/26/2023: TSH 1.57 12/08/2023: ALT 15; BUN 17; Creatinine, Ser 1.01; Hemoglobin 14.6; Platelets 332; Potassium 4.3; Sodium 141  Recent Lipid Panel    Component Value Date/Time   CHOL 249 (H) 12/08/2023 1132   TRIG 116 12/08/2023 1132   HDL 82 12/08/2023 1132   CHOLHDL 3.0 12/08/2023 1132   LDLCALC 147 (H) 12/08/2023 1132    Physical Exam:    VS:  BP (!) 170/80   Pulse 62   Ht 5' (1.524 m)   Wt 138  lb (62.6 kg)   LMP  (LMP Unknown) Comment: tubal ligation  SpO2 99%   BMI 26.95 kg/m     Wt Readings from Last 3 Encounters:  01/12/24 138 lb (62.6 kg)  12/22/23 132 lb (59.9 kg)  12/08/23 134 lb 9.6 oz (61.1 kg)     GEN:  Well nourished, well developed in no acute distress HEENT: Normal NECK: No JVD; No carotid bruits LYMPHATICS: No lymphadenopathy CARDIAC: RRR, no murmurs, no rubs, no gallops RESPIRATORY:  Clear to auscultation without rales, wheezing or rhonchi  ABDOMEN: Soft, non-tender, non-distended MUSCULOSKELETAL:  No edema; No deformity  SKIN: Warm and dry LOWER EXTREMITIES: no swelling NEUROLOGIC:  Alert and oriented x 3 PSYCHIATRIC:  Normal affect   ASSESSMENT:    1. Coronary artery disease involving native coronary artery of native heart without angina pectoris   2. Obstructive sleep apnea   3. Sarcoidosis   4. Dyslipidemia   5. Supraventricular tachycardia    PLAN:    In order of problems listed above:  Supraventricular tachycardia which is new diagnosis will initiate metoprolol  succinate 25 daily.  I explained to her the nature of arrhythmia and told her that this is usually benign issues. Coronary disease stress test negative will continue risk modification she is aware of antiplatelet therapy which I continue. Dyslipidemia she was taking rosuvastatin  but stopped because she  was not feeling well.  I did review KPN from December 08, 2023 LDL 147 HDL 82.  Will put her on 5 mg Crestor  to see if she she can tolerate it, fasting lipid profile will be rechecked. Essential hypertension poorly controlled hopefully addition of metoprolol  will help   Medication Adjustments/Labs and Tests Ordered: Current medicines are reviewed at length with the patient today.  Concerns regarding medicines are outlined above.  Orders Placed This Encounter  Procedures   ALT   AST   Lipid panel   Medication changes:  Meds ordered this encounter  Medications   rosuvastatin  (CRESTOR ) 5 MG tablet    Sig: Take 1 tablet (5 mg total) by mouth daily.    Dispense:  90 tablet    Refill:  3    Signed, Lamar DOROTHA Fitch, MD, [facility] 01/12/2024 10:12 AM    [facility]     [1]  Current Meds  Medication Sig   albuterol  (VENTOLIN  HFA) 108 (90 Base) MCG/ACT inhaler Inhale 2 puffs into the lungs every 6 (six) hours as needed for wheezing or shortness of breath.    aspirin  EC 81 MG tablet Take 81 mg by mouth at bedtime.    Biotin 89999 MCG TABS Take by mouth.   Capsicum, Cayenne, (CAYENNE PEPPER PO) Take by mouth daily.   DULoxetine  (CYMBALTA ) 30 MG capsule Take 1 capsule (30 mg total) by mouth daily.   lidocaine  (LIDODERM ) 5 % Place 1 patch onto the skin as needed.    lubiprostone (AMITIZA) 24 MCG capsule Take 24 mcg by mouth 2 (two) times daily.   MAGNESIUM  PO Take by mouth daily.   meclizine (ANTIVERT) 12.5 MG tablet Take 1-2 tablets by mouth every 4 (four) hours as needed.   Melatonin 10 MG CAPS Take by mouth.   methocarbamol  (ROBAXIN ) 500 MG tablet Take 500 mg by mouth every 8 (eight) hours as needed for muscle spasms.   metoprolol  succinate (TOPROL -XL) 25 MG 24 hr tablet Take 1 tablet (25 mg total) by mouth daily. Take with or immediately following a meal.   Multiple Vitamin (MULTIVITAMIN WITH MINERALS)  TABS tablet Take 1 tablet by mouth at bedtime.   ondansetron   (ZOFRAN ) 4 MG tablet Take 4 mg by mouth as needed for nausea or vomiting.   polyethylene glycol (MIRALAX  / GLYCOLAX ) packet Take 17 g by mouth daily as needed for mild constipation.    rizatriptan (MAXALT) 10 MG tablet Take 10 mg by mouth as needed for migraine.    rosuvastatin  (CRESTOR ) 5 MG tablet Take 1 tablet (5 mg total) by mouth daily.   "

## 2024-01-12 NOTE — Patient Instructions (Signed)
 Medication Instructions:   START: Crestor  5mg  1 tablet daily   Lab Work: Your physician recommends that you return for lab work in: 6 weeks You need to have labs done when you are fasting.  You can come Monday through Friday 8:30 am to 12:00 pm and 1:15 to 4:30. You do not need to make an appointment as the order has already been placed.     Testing/Procedures: None Ordered   Follow-Up: At [facility], you and your health needs are our priority.  As part of our continuing mission to provide you with exceptional heart care, we have created designated Provider Care Teams.  These Care Teams include your primary Cardiologist (physician) and Advanced Practice Providers (APPs -  Physician Assistants and Nurse Practitioners) who all work together to provide you with the care you need, when you need it.  We recommend signing up for the patient portal called MyChart.  Sign up information is provided on this After Visit Summary.  MyChart is used to connect with patients for Virtual Visits (Telemedicine).  Patients are able to view lab/test results, encounter notes, upcoming appointments, etc.  Non-urgent messages can be sent to your provider as well.   To learn more about what you can do with MyChart, go to [email or web address].    Your next appointment:   3 month(s)  The format for your next appointment:   In Person  Provider:   Lamar Fitch, MD    Other Instructions NA

## 2024-02-04 ENCOUNTER — Ambulatory Visit (HOSPITAL_BASED_OUTPATIENT_CLINIC_OR_DEPARTMENT_OTHER)

## 2024-02-10 ENCOUNTER — Encounter (HOSPITAL_BASED_OUTPATIENT_CLINIC_OR_DEPARTMENT_OTHER): Payer: Self-pay

## 2024-02-10 ENCOUNTER — Ambulatory Visit (HOSPITAL_BASED_OUTPATIENT_CLINIC_OR_DEPARTMENT_OTHER)

## 2024-02-16 ENCOUNTER — Ambulatory Visit (HOSPITAL_BASED_OUTPATIENT_CLINIC_OR_DEPARTMENT_OTHER)

## 2024-03-08 ENCOUNTER — Ambulatory Visit (HOSPITAL_BASED_OUTPATIENT_CLINIC_OR_DEPARTMENT_OTHER): Admitting: Family Medicine
